# Patient Record
Sex: Female | Born: 1970
Health system: Southern US, Community
[De-identification: ages and names within clinical notes are randomized; demographics above are authoritative.]

## PROBLEM LIST (undated history)

## (undated) DIAGNOSIS — F32A Depression, unspecified: Secondary | ICD-10-CM

## (undated) DIAGNOSIS — E282 Polycystic ovarian syndrome: Secondary | ICD-10-CM

## (undated) DIAGNOSIS — E119 Type 2 diabetes mellitus without complications: Secondary | ICD-10-CM

## (undated) DIAGNOSIS — F329 Major depressive disorder, single episode, unspecified: Secondary | ICD-10-CM

## (undated) DIAGNOSIS — G4730 Sleep apnea, unspecified: Secondary | ICD-10-CM

## (undated) DIAGNOSIS — E785 Hyperlipidemia, unspecified: Secondary | ICD-10-CM

## (undated) HISTORY — DX: Hyperlipidemia, unspecified: E78.5

## (undated) HISTORY — PX: THROAT SURGERY: SHX803

## (undated) HISTORY — PX: LAPAROTOMY: SHX154

## (undated) HISTORY — DX: Type 2 diabetes mellitus without complications: E11.9

## (undated) HISTORY — DX: Depression, unspecified: F32.A

## (undated) HISTORY — DX: Major depressive disorder, single episode, unspecified: F32.9

---

## 1997-08-27 ENCOUNTER — Other Ambulatory Visit: Admission: RE | Admit: 1997-08-27 | Discharge: 1997-08-27 | Payer: Self-pay | Admitting: *Deleted

## 1999-08-21 ENCOUNTER — Other Ambulatory Visit: Admission: RE | Admit: 1999-08-21 | Discharge: 1999-08-21 | Payer: Self-pay | Admitting: Internal Medicine

## 2000-04-23 ENCOUNTER — Encounter: Payer: Self-pay | Admitting: Emergency Medicine

## 2000-04-23 ENCOUNTER — Emergency Department (HOSPITAL_COMMUNITY): Admission: EM | Admit: 2000-04-23 | Discharge: 2000-04-23 | Payer: Self-pay | Admitting: Emergency Medicine

## 2000-08-13 ENCOUNTER — Emergency Department (HOSPITAL_COMMUNITY): Admission: EM | Admit: 2000-08-13 | Discharge: 2000-08-13 | Payer: Self-pay | Admitting: Emergency Medicine

## 2000-08-13 ENCOUNTER — Encounter: Payer: Self-pay | Admitting: Emergency Medicine

## 2000-08-14 ENCOUNTER — Emergency Department (HOSPITAL_COMMUNITY): Admission: EM | Admit: 2000-08-14 | Discharge: 2000-08-14 | Payer: Self-pay | Admitting: Emergency Medicine

## 2000-08-15 ENCOUNTER — Encounter: Payer: Self-pay | Admitting: Emergency Medicine

## 2000-08-15 ENCOUNTER — Emergency Department (HOSPITAL_COMMUNITY): Admission: EM | Admit: 2000-08-15 | Discharge: 2000-08-15 | Payer: Self-pay | Admitting: Emergency Medicine

## 2003-01-09 ENCOUNTER — Encounter: Admission: RE | Admit: 2003-01-09 | Discharge: 2003-01-09 | Payer: Self-pay | Admitting: Internal Medicine

## 2003-04-03 ENCOUNTER — Other Ambulatory Visit (HOSPITAL_COMMUNITY): Admission: RE | Admit: 2003-04-03 | Discharge: 2003-04-19 | Payer: Self-pay | Admitting: Psychiatry

## 2003-10-14 ENCOUNTER — Ambulatory Visit (HOSPITAL_COMMUNITY): Admission: RE | Admit: 2003-10-14 | Discharge: 2003-10-14 | Payer: Self-pay | Admitting: Emergency Medicine

## 2003-10-14 ENCOUNTER — Emergency Department (HOSPITAL_COMMUNITY): Admission: EM | Admit: 2003-10-14 | Discharge: 2003-10-14 | Payer: Self-pay | Admitting: Emergency Medicine

## 2007-11-15 ENCOUNTER — Ambulatory Visit: Payer: Self-pay

## 2009-07-30 ENCOUNTER — Ambulatory Visit (HOSPITAL_COMMUNITY): Admission: RE | Admit: 2009-07-30 | Discharge: 2009-07-30 | Payer: Self-pay | Admitting: Obstetrics and Gynecology

## 2010-04-27 LAB — HCG, SERUM, QUALITATIVE: Preg, Serum: NEGATIVE

## 2010-04-27 LAB — CBC
MCHC: 33.8 g/dL (ref 30.0–36.0)
RBC: 4.08 MIL/uL (ref 3.87–5.11)

## 2010-09-19 ENCOUNTER — Telehealth: Payer: Self-pay | Admitting: *Deleted

## 2010-10-02 NOTE — Telephone Encounter (Signed)
Error

## 2010-10-04 ENCOUNTER — Emergency Department (HOSPITAL_COMMUNITY)
Admission: EM | Admit: 2010-10-04 | Discharge: 2010-10-05 | Disposition: A | Discharge status: Other Acute Inpatient Hospital | Payer: Self-pay | Attending: Emergency Medicine | Admitting: Emergency Medicine

## 2010-10-04 DIAGNOSIS — F3289 Other specified depressive episodes: Secondary | ICD-10-CM | POA: Insufficient documentation

## 2010-10-04 DIAGNOSIS — F329 Major depressive disorder, single episode, unspecified: Secondary | ICD-10-CM | POA: Insufficient documentation

## 2010-10-04 DIAGNOSIS — R45851 Suicidal ideations: Secondary | ICD-10-CM | POA: Insufficient documentation

## 2010-10-05 ENCOUNTER — Inpatient Hospital Stay (HOSPITAL_COMMUNITY)
Admission: EM | Admit: 2010-10-05 | Discharge: 2010-10-19 | DRG: 885 | Disposition: A | Discharge status: Home / Self Care | Payer: PRIVATE HEALTH INSURANCE | Source: Other Acute Inpatient Hospital | Attending: Psychiatry | Admitting: Psychiatry

## 2010-10-05 DIAGNOSIS — R45851 Suicidal ideations: Secondary | ICD-10-CM

## 2010-10-05 DIAGNOSIS — E282 Polycystic ovarian syndrome: Secondary | ICD-10-CM

## 2010-10-05 DIAGNOSIS — Z79899 Other long term (current) drug therapy: Secondary | ICD-10-CM

## 2010-10-05 DIAGNOSIS — Z560 Unemployment, unspecified: Secondary | ICD-10-CM

## 2010-10-05 DIAGNOSIS — Z882 Allergy status to sulfonamides status: Secondary | ICD-10-CM

## 2010-10-05 DIAGNOSIS — G4730 Sleep apnea, unspecified: Secondary | ICD-10-CM

## 2010-10-05 DIAGNOSIS — E119 Type 2 diabetes mellitus without complications: Secondary | ICD-10-CM

## 2010-10-05 DIAGNOSIS — F329 Major depressive disorder, single episode, unspecified: Principal | ICD-10-CM

## 2010-10-05 LAB — COMPREHENSIVE METABOLIC PANEL
BUN: 9 mg/dL (ref 6–23)
Calcium: 9.9 mg/dL (ref 8.4–10.5)
Creatinine, Ser: 0.77 mg/dL (ref 0.50–1.10)
GFR calc Af Amer: >60 mL/min (ref >60)
Glucose, Bld: 104 mg/dL — ABNORMAL HIGH (ref 70–99)
Total Protein: 7.5 g/dL (ref 6.0–8.3)

## 2010-10-05 LAB — DIFFERENTIAL
Basophils Relative: 1 % (ref 0–1)
Eosinophils Absolute: 0.4 10*3/uL (ref 0.0–0.7)
Lymphs Abs: 3.9 10*3/uL (ref 0.7–4.0)
Neutrophils Relative %: 61 % (ref 43–77)

## 2010-10-05 LAB — CBC
MCV: 95.4 fL (ref 78.0–100.0)
Platelets: 356 10*3/uL (ref 150–400)
RBC: 4.37 MIL/uL (ref 3.87–5.11)
WBC: 13.4 10*3/uL — ABNORMAL HIGH (ref 4.0–10.5)

## 2010-10-05 LAB — ETHANOL: Alcohol, Ethyl (B): <11 mg/dL (ref 0–11)

## 2010-10-05 LAB — RAPID URINE DRUG SCREEN, HOSP PERFORMED
Amphetamines: POSITIVE — AB
Benzodiazepines: POSITIVE — AB
Cocaine: NOT DETECTED
Opiates: NOT DETECTED

## 2010-10-05 LAB — GLUCOSE, CAPILLARY: Glucose-Capillary: 125 mg/dL — ABNORMAL HIGH (ref 70–99)

## 2010-10-06 DIAGNOSIS — F339 Major depressive disorder, recurrent, unspecified: Secondary | ICD-10-CM

## 2010-10-06 DIAGNOSIS — F6089 Other specific personality disorders: Secondary | ICD-10-CM

## 2010-10-07 LAB — GLUCOSE, CAPILLARY: Glucose-Capillary: 115 mg/dL — ABNORMAL HIGH (ref 70–99)

## 2010-10-07 NOTE — Assessment & Plan Note (Signed)
  NAME:  Porter, Alicia             ACCOUNT NO.:  [id number]  MEDICAL RECORD NO.:  [id number]  LOCATION:  0507                          FACILITY:  [facility]  PHYSICIAN:  Franchot Gallo, MD     DATE OF BIRTH:  1971-02-04  DATE OF ADMISSION:  10/05/2010 DATE OF DISCHARGE:                      PSYCHIATRIC ADMISSION ASSESSMENT   HISTORY OF PRESENT ILLNESS:  The patient presented to the emergency department on petition and  with petition papers, stating the patient was having suicidal thoughts with a plan and means.  The patient was recently terminated from her job, unable to care for herself and hopeless; has had previous suicide attempts and is considered a danger to herself.  The patient did have a plan to overdose on insulin.  Much of this dictation is gleaned from the record.  Again, the patient was reported to have a plan on injecting herself with insulin, because it is the easiest way and again, has a past history of suicide attempts.  PAST PSYCHIATRIC HISTORY:  This appears to be the patient's first admission to the [facility]  She apparently was seen at [facility]  SOCIAL HISTORY:  Unclear, but the records indicate the patient lives in [place] and it is unclear if other family members reside with her, but it stated the patient is single and unemployed.  No apparent alcohol or substance use.  FAMILY HISTORY:  Unknown.  PRIMARY CARE PROVIDER:  Unknown.  MEDICAL PROBLEMS LIST:  Diabetes and sleep apnea.  MEDICATIONS LISTED FROM THE EMERGENCY ROOM RECORDS:  Geodon 40 mg b.i.d., Xanax 0.5 mg p.r.n., spironolactone 50 mg daily, Pristiq 50 mg daily, Abilify 10 mg daily.  DRUG ALLERGIES:  Sulfa.  PHYSICAL EXAMINATION:  General:  This is a middle-aged female.  She appears in no distress, although very angry and hostile.  Her physical exam reviewed from the emergency room shows no significant abnormalities.  LABORATORY DATA:  Her alcohol level is less than 11.   Glucose of 104. Urine drug screen is positive for benzodiazepines and amphetamines.  The white count is at 13.4.  MENTAL STATUS EXAM:  The patient is resting in bed.  The patient was very angry and immediately yelling, asking me what was there for.  Did not want to answer the same questions, cursing and states that she did need not to be here.  Unable to obtain further information.  DIAGNOSES:  AXIS I:  Major depression disorder. AXIS II:  Deferred. AXIS III:  History of diabetes, sleep apnea and polycystic ovarian disease. AXIS IV:  Possible problems related to occupation, other psychosocial problems and medical problems. AXIS V:  Currently 25-30.  PLAN:  Obtain more collateral information, review medications, contact family for background and check her blood sugars b.i.d.     Landry Corporal, N.P.   ______________________________ Franchot Gallo, MD    JO/MEDQ  D:  10/06/2010  T:  10/06/2010  Job:  [id number]  Electronically Signed by Limmie Patricia.P. on 10/07/2010 09:08:22 AM Electronically Signed by Franchot Gallo MD on 10/07/2010 04:43:39 PM

## 2010-10-09 LAB — GLUCOSE, CAPILLARY: Glucose-Capillary: 108 mg/dL — ABNORMAL HIGH (ref 70–99)

## 2010-10-11 DIAGNOSIS — F609 Personality disorder, unspecified: Secondary | ICD-10-CM

## 2010-10-11 DIAGNOSIS — F909 Attention-deficit hyperactivity disorder, unspecified type: Secondary | ICD-10-CM

## 2010-10-11 DIAGNOSIS — F339 Major depressive disorder, recurrent, unspecified: Secondary | ICD-10-CM

## 2010-10-11 LAB — TSH: TSH: 2.126 u[IU]/mL (ref 0.350–4.500)

## 2010-10-11 LAB — T3, FREE: T3, Free: 2.8 pg/mL (ref 2.3–4.2)

## 2010-10-20 NOTE — Discharge Summary (Signed)
NAME:  Alicia Porter, Alicia Porter             ACCOUNT NO.:  [id number]  MEDICAL RECORD NO.:  [id number]  LOCATION:  0507                          FACILITY:  [facility]  PHYSICIAN:  Franchot Gallo, MD     DATE OF BIRTH:  07-04-1970  DATE OF ADMISSION:  10/05/2010 DATE OF DISCHARGE:  10/19/2010                              DISCHARGE SUMMARY   REASON FOR ADMISSION:  This was a 40 year old female that presented on petition stating that the patient was having suicidal thoughts with a plan and means.  She was recently terminated from a job, unable to care for herself, feeling very hopeless and has had a history of previous suicide attempts.  FINAL DIAGNOSES:   AXIS I:  Major depressive disorder, attention deficit disorder. AXIS II:  Cluster B personality characteristics. AXIS III:  History of diabetes, polycystic ovarian disease and sleep apnea. AXIS IV:  Unemployment. AXIS V:  Global assessment of functioning at discharge 60-65.LABORATORY DATA:  Alcohol level less than 11, glucose 104.  Urine drug screen positive for benzodiazepines and amphetamines.  SIGNIFICANT FINDINGS:  The patient was admitted to the adult milieu for safety and stabilization.  We will attempt to gain more information as the patient is not talking right now, very hostile.  We will monitor her blood sugars.  The patient was reporting problems with her sleep and appetite.  Rating her depression severe.  She was having vague homicidal thoughts, but no plan or intent and having no psychotic symptoms.  We initiated Lexapro for depression.  Discontinued her Pristiq.  Changed her Geodon at bedtime for mood stabilization.  She was reporting difficulty sleeping and continued with having problem with appetite. The patient was participating in groups.  She was becoming more open and willing to discuss her symptoms.  She was still endorsing problems with sleep and having racing thoughts.  Her appetite was improving.  Having some mild  paranoid ideations.  No medication side effects.  We discussed the options of adjusting doses of her medications, but the patient was wanting to stay on her current meds at this time.  We discontinued the patient's Xanax as she was reporting ongoing anxiety and changed to Klonopin t.i.d.  We had contact with the patient's father to address any safety issues and for [place] to provide information and suicide risk factors, prevention and intervention.  The patient's partner stated that she would lock up the patient's insulin and secure the key.  Her partner had attended family forum where they discussed general information, depression and stress.  The patient was beginning to improve, but continued to endorse ruminating thoughts and feeling very anxious.  The patient's depression was improving, rating it a 6 on a scale 1-10 and her hopelessness a 3 on a scale of 1-10.  We changed the times of her Klonopin so the patient could sleep at night and increased her Adderall.  We also increased her Geodon to lessen her depressive symptoms and to help with her paranoid thinking.  We also started Effexor to augment her antidepressant.  Her sleep was improving. Her appetite was improving.  Her depression was less, but she continued to endorse low energy.  The patient was getting better  and was tired of being "locked up."  She felt that she was at her optimum with her hospitalization, wanting to return home and felt that she was ready to go home.  On day of discharge, the patient was seen by Dr. Rogers Blocker, who felt the patient was stable for discharge.  She showed no acute manic or psychotic symptoms.  No side effects reported.  She was motivated to follow up with her outside providers and was felt stable for discharge.  DISCHARGE MEDICATIONS: 1. Klonopin 1 mg t.i.d. 2. Lexapro 20 mg daily. 3. Effexor XR 75 mg one daily. 4. Geodon 20 mg taking 100 mg at 6 p.m. and 40 mg in the morning. 5. Adderall 20  mg one tablet b.i.d. 6. Spirolactone 50 mg one daily.  FOLLOW-UP APPOINTMENTS: 1. [facility] Mixer, phone number [phone]. 2. [facility], phone number [phone].     Landry Corporal, N.P.   ______________________________ Franchot Gallo, MD    JO/MEDQ  D:  10/20/2010  T:  10/20/2010  Job:  [id number]  Electronically Signed by Limmie PatriciaP. on 10/20/2010 03:25:09 PM Electronically Signed by Franchot Gallo MD on 10/20/2010 06:03:55 PM

## 2011-01-23 ENCOUNTER — Encounter (HOSPITAL_COMMUNITY): Payer: Self-pay | Admitting: *Deleted

## 2011-01-23 ENCOUNTER — Inpatient Hospital Stay (HOSPITAL_COMMUNITY)
Admission: RE | Admit: 2011-01-23 | Discharge: 2011-01-26 | DRG: 885 | Disposition: A | Discharge status: Home / Self Care | Payer: PRIVATE HEALTH INSURANCE | Source: Ambulatory Visit | Attending: Psychiatry | Admitting: Psychiatry

## 2011-01-23 DIAGNOSIS — R45851 Suicidal ideations: Secondary | ICD-10-CM

## 2011-01-23 DIAGNOSIS — Z882 Allergy status to sulfonamides status: Secondary | ICD-10-CM

## 2011-01-23 DIAGNOSIS — F603 Borderline personality disorder: Secondary | ICD-10-CM

## 2011-01-23 DIAGNOSIS — G4700 Insomnia, unspecified: Secondary | ICD-10-CM

## 2011-01-23 DIAGNOSIS — F339 Major depressive disorder, recurrent, unspecified: Secondary | ICD-10-CM

## 2011-01-23 DIAGNOSIS — Z560 Unemployment, unspecified: Secondary | ICD-10-CM

## 2011-01-23 DIAGNOSIS — Z79899 Other long term (current) drug therapy: Secondary | ICD-10-CM

## 2011-01-23 DIAGNOSIS — G4733 Obstructive sleep apnea (adult) (pediatric): Secondary | ICD-10-CM

## 2011-01-23 DIAGNOSIS — F332 Major depressive disorder, recurrent severe without psychotic features: Principal | ICD-10-CM

## 2011-01-23 DIAGNOSIS — F411 Generalized anxiety disorder: Secondary | ICD-10-CM

## 2011-01-23 DIAGNOSIS — E282 Polycystic ovarian syndrome: Secondary | ICD-10-CM

## 2011-01-23 DIAGNOSIS — Z888 Allergy status to other drugs, medicaments and biological substances status: Secondary | ICD-10-CM

## 2011-01-23 HISTORY — DX: Polycystic ovarian syndrome: E28.2

## 2011-01-23 HISTORY — DX: Sleep apnea, unspecified: G47.30

## 2011-01-23 MED ORDER — NICOTINE 21 MG/24HR TD PT24
21.0000 mg | MEDICATED_PATCH | Freq: Every day | TRANSDERMAL | Status: DC
  Administered 2011-01-24 – 2011-01-26 (×3): 21 mg via TRANSDERMAL
  Filled 2011-01-23 (×5): qty 1

## 2011-01-23 MED ORDER — ACETAMINOPHEN 325 MG PO TABS
650.0000 mg | ORAL_TABLET | Freq: Four times a day (QID) | ORAL | Status: DC | PRN
  Administered 2011-01-24 (×2): 650 mg via ORAL

## 2011-01-23 MED ORDER — MAGNESIUM HYDROXIDE 400 MG/5ML PO SUSP: 30.0000 mL | Freq: Every day | ORAL | Status: DC | PRN

## 2011-01-23 MED ORDER — CLONAZEPAM 1 MG PO TABS
1.0000 mg | ORAL_TABLET | Freq: Three times a day (TID) | ORAL | Status: DC
  Administered 2011-01-23 – 2011-01-26 (×9): 1 mg via ORAL
  Filled 2011-01-23 (×9): qty 1

## 2011-01-23 MED ORDER — LORAZEPAM 1 MG PO TABS
2.0000 mg | ORAL_TABLET | Freq: Four times a day (QID) | ORAL | Status: DC | PRN
  Administered 2011-01-23 – 2011-01-24 (×2): 2 mg via ORAL
  Filled 2011-01-23 (×2): qty 2

## 2011-01-23 MED ORDER — ALUM & MAG HYDROXIDE-SIMETH 200-200-20 MG/5ML PO SUSP: 30.0000 mL | ORAL | Status: DC | PRN

## 2011-01-23 MED ORDER — SPIRONOLACTONE 50 MG PO TABS
50.0000 mg | ORAL_TABLET | Freq: Every day | ORAL | Status: DC
  Administered 2011-01-24 – 2011-01-26 (×3): 50 mg via ORAL
  Filled 2011-01-23 (×4): qty 1

## 2011-01-23 MED ORDER — ZIPRASIDONE HCL 60 MG PO CAPS
ORAL_CAPSULE | ORAL | Status: AC
Start: 2011-01-23 — End: 2011-01-24
  Filled 2011-01-23: qty 2

## 2011-01-23 MED ORDER — ZIPRASIDONE HCL 60 MG PO CAPS
120.0000 mg | ORAL_CAPSULE | Freq: Every day | ORAL | Status: DC
  Administered 2011-01-23: 120 mg via ORAL
  Filled 2011-01-23: qty 2
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
## 2011-01-24 DIAGNOSIS — F603 Borderline personality disorder: Secondary | ICD-10-CM | POA: Diagnosis present

## 2011-01-24 DIAGNOSIS — F39 Unspecified mood [affective] disorder: Secondary | ICD-10-CM

## 2011-01-24 DIAGNOSIS — F411 Generalized anxiety disorder: Secondary | ICD-10-CM | POA: Diagnosis present

## 2011-01-24 DIAGNOSIS — F339 Major depressive disorder, recurrent, unspecified: Secondary | ICD-10-CM | POA: Diagnosis present

## 2011-01-24 LAB — COMPREHENSIVE METABOLIC PANEL
AST: 13 U/L (ref 0–37)
Albumin: 3.6 g/dL (ref 3.5–5.2)
Calcium: 9.8 mg/dL (ref 8.4–10.5)
Creatinine, Ser: 0.89 mg/dL (ref 0.50–1.10)
GFR calc non Af Amer: 79 mL/min — ABNORMAL LOW (ref >90)

## 2011-01-24 LAB — CBC
Hemoglobin: 12.8 g/dL (ref 12.0–15.0)
MCH: 31.1 pg (ref 26.0–34.0)
RBC: 4.12 MIL/uL (ref 3.87–5.11)

## 2011-01-24 MED ORDER — IBUPROFEN 600 MG PO TABS
600.0000 mg | ORAL_TABLET | Freq: Four times a day (QID) | ORAL | Status: DC | PRN
  Administered 2011-01-24: 600 mg via ORAL
  Filled 2011-01-24: qty 1

## 2011-01-24 MED ORDER — VENLAFAXINE HCL 75 MG PO TABS
75.0000 mg | ORAL_TABLET | Freq: Two times a day (BID) | ORAL | Status: DC
  Administered 2011-01-26: 75 mg via ORAL
  Filled 2011-01-24 (×2): qty 1
  Filled 2011-01-24: qty 2
  Filled 2011-01-24: qty 1
  Filled 2011-01-24: qty 2
  Filled 2011-01-24 (×3): qty 1

## 2011-01-24 MED ORDER — OLANZAPINE 5 MG PO TABS
5.0000 mg | ORAL_TABLET | Freq: Every day | ORAL | Status: DC
  Administered 2011-01-24 – 2011-01-25 (×2): 5 mg via ORAL
  Filled 2011-01-24 (×4): qty 1

## 2011-01-24 MED ORDER — IBUPROFEN 600 MG PO TABS
ORAL_TABLET | ORAL | Status: AC
  Administered 2011-01-24: 16:00:00
  Filled 2011-01-24: qty 1

## 2011-01-24 MED ORDER — OLANZAPINE 5 MG PO TABS
5.0000 mg | ORAL_TABLET | Freq: Every day | ORAL | Status: DC | PRN
  Administered 2011-01-24 – 2011-01-25 (×2): 5 mg via ORAL
  Filled 2011-01-24: qty 1

## 2011-01-24 MED ORDER — OLANZAPINE 2.5 MG PO TABS
2.5000 mg | ORAL_TABLET | ORAL | Status: DC
  Administered 2011-01-24 – 2011-01-25 (×2): 2.5 mg via ORAL
  Filled 2011-01-24 (×5): qty 1

## 2011-01-24 NOTE — H&P (Signed)
Alicia Porter is an 40 y.o. female.   Chief Complaint:   Presents with exacerbation of depression and plan to "just take all my klonopin and go to sleep."  Feeling very depressed with 2-3 weeks of hypersomnia alternating with poor sleep.  Increased anhedonia.  Unable to search for work due to increased depression.   HPI: See Psych Admit Note  Past Medical History  Diagnosis Date  . Sleep apnea   . Polycystic ovarian disease     No past surgical history on file.  No family history on file. Social History:  reports that she has been smoking.  She does not have any smokeless tobacco history on file. Her alcohol and drug histories not on file.  Allergies:  Allergies  Allergen Reactions  . Sulfa Antibiotics   . Wellbutrin (Bupropion Hcl)     Medications Prior to Admission  Medication Dose Route Frequency Provider Last Rate Last Dose  . acetaminophen (TYLENOL) tablet 650 mg  650 mg Oral Q6H PRN Alyson Kuroski-Mazzei, DO   650 mg at 01/24/11 1247  . alum & mag hydroxide-simeth (MAALOX/MYLANTA) 200-200-20 MG/5ML suspension 30 mL  30 mL Oral Q4H PRN Alyson Kuroski-Mazzei, DO      . clonazePAM (KLONOPIN) tablet 1 mg  1 mg Oral TID Alyson Kuroski-Mazzei, DO   1 mg at 01/24/11 1247  . ibuprofen (ADVIL,MOTRIN) 600 MG tablet           . ibuprofen (ADVIL,MOTRIN) tablet 600 mg  600 mg Oral Q6H PRN Viviann Spare, NP   600 mg at 01/24/11 1609  . magnesium hydroxide (MILK OF MAGNESIA) suspension 30 mL  30 mL Oral Daily PRN Alyson Kuroski-Mazzei, DO      . nicotine (NICODERM CQ - dosed in mg/24 hours) patch 21 mg  21 mg Transdermal Q0600 Alyson Kuroski-Mazzei, DO   21 mg at 01/24/11 1610  . OLANZapine (ZYPREXA) tablet 2.5 mg  2.5 mg Oral 1 day or 1 dose Viviann Spare, NP   2.5 mg at 01/24/11 1247  . OLANZapine (ZYPREXA) tablet 5 mg  5 mg Oral QHS Viviann Spare, NP      . OLANZapine (ZYPREXA) tablet 5 mg  5 mg Oral Daily PRN Viviann Spare, NP      . spironolactone (ALDACTONE) tablet  50 mg  50 mg Oral Daily Alyson Kuroski-Mazzei, DO   50 mg at 01/24/11 0845  . venlafaxine (EFFEXOR) tablet 75 mg  75 mg Oral BID WC Viviann Spare, NP      . ziprasidone (GEODON) 60 MG capsule           . DISCONTD: LORazepam (ATIVAN) tablet 2 mg  2 mg Oral Q6H PRN Alyson Kuroski-Mazzei, DO   2 mg at 01/24/11 0358  . DISCONTD: ziprasidone (GEODON) capsule 120 mg  120 mg Oral QHS Alyson Kuroski-Mazzei, DO   120 mg at 01/23/11 2214   No current outpatient prescriptions on file as of 01/24/2011.    No results found for this or any previous visit (from the past 48 hour(s)). No results found.  Review of Systems  Constitutional: Positive for malaise/fatigue.  Eyes: Negative.   Respiratory: Negative.   Cardiovascular: Negative.   Gastrointestinal: Negative.   Genitourinary: Negative.   Musculoskeletal: Negative.   Skin: Negative.   Neurological: Positive for headaches.  Endo/Heme/Allergies: Negative.   Psychiatric/Behavioral: Positive for depression and suicidal ideas. The patient has insomnia.     Blood pressure 108/74, pulse 102, temperature 98 F (36.7 C), temperature  source Oral, resp. rate 16, height 5\' 7"  (1.702 m), weight 107.956 kg (238 lb), SpO2 97.00%. Physical Exam  Constitutional: She is oriented to person, place, and time. She appears well-developed and well-nourished.  HENT:  Head: Normocephalic.  Eyes: Pupils are equal, round, and reactive to light.  Neck: Normal range of motion. Neck supple.  Cardiovascular: Normal rate.   Respiratory: Effort normal and breath sounds normal.  GI: Soft.       Obese   Genitourinary:       deferred  Musculoskeletal: Normal range of motion.  Neurological: She is alert and oriented to person, place, and time. She has normal reflexes.  Skin: Skin is warm and dry.     Assessment/Plan See Psych Admit Note for Treatment Plan  Alicia Porter 01/24/2011, 5:09 PM

## 2011-01-24 NOTE — Progress Notes (Signed)
Patient ID: Alicia Porter, female   DOB: [birth year], 40 y.o.   MRN: [id number] 01/21/2011 D Alicia Porter has spent most of the day, in her bed asleep. When she does get up and come out in the hall, she is guarded, somewhat suspicious and paranoid. SHe  Refused her effexor today, saying it was not helping her and wants it to be stopped. SHe was given ibuprofen 600 mg at 1610 for c/o headace that was the  She received zyprexa 5 mg at 1845 , for c/o extreme anxiety and agitation. AShe did not attend her groups today, saying she was too sleepy. R Safety is maintaiend and POC includes keeping pt safe and fostering therapeutic alliance already established. PD RN [facility]

## 2011-01-24 NOTE — Progress Notes (Signed)
Suicide Risk Assessment  Admission Assessment     Demographic factors:  Assessment Details Time of Assessment: Admission Information Obtained From: Patient Current Mental Status:  Current Mental Status: Suicidal ideation indicated by patient Loss Factors:  Loss Factors: Decrease in vocational status;Financial problems / change in socioeconomic status Historical Factors:  Historical Factors: Prior suicide attempts Risk Reduction Factors:  Risk Reduction Factors: Positive therapeutic relationship  CLINICAL FACTORS:   Severe Anxiety and/or Agitation Depression:   Anhedonia Hopelessness Insomnia Severe Personality Disorders:   Cluster B More than one psychiatric diagnosis Previous Psychiatric Diagnoses and Treatments  COGNITIVE FEATURES THAT CONTRIBUTE TO RISK:  Polarized thinking    Diagnoses: Axis I:  Major Depressive Disorder - Recurrent  Generalized Anxiety Disorder Axis II:  Borderline Personality Disorder  The patient was seen today and reports the following:   Sleep: The patient reports to sleeping well last night  Appetite: Good.   Mild>(1-10) >Severe  Hopelessness (1-10): 9  Depression (1-10): 9  Anxiety (1-10): 9   Suicidal Ideation: The patient reports active suicidal ideations today but with no plan or intent.  Plan: No  Intent: No  Means: No   Homicidal Ideation: The patient adamantly denies any homicidal ideations today.  Plan: No  Intent: No.  Means: No   Eye Contact: Good.  General Appearance Luretha Murphy: Neat and Casual. The patient appears significantly depressed and anxious. Motor Behavior: Slightly slowed.  Speech: WNL  Mental Status: AO x 3  Level of Consciousness: Alert  Mood: Severely Depressed.  Affect: Severely Constricted.  Anxiety: Severely Anxious.  Thought Process: Coherent.  Thought Content: WNL. No current auditory of visual hallucinations or delusional thinking.   Perception: Normal  Judgment: Fair.  Insight: Fair.  Cognition:  Orientated to person, place and time.   The patient states that she is interested in being transferred to [facility] for ECT.  She states she has been on every antidepressant on the market and feels ECT is her last hope.  I explained that this option can certainly be explored by her treatment team.  Treatment Plan Summary:  1. Daily contact with patient to assess and evaluate symptoms and progress in treatment  2. Medication management  3. The patient will deny suicidal ideations or homicidal ideations for 48 hours prior to discharge and have a depression and anxiety rating of 3 or less. The patient will also deny any auditory or visual hallucinations or delusional thinking or display any manic or hypomanic behaviors.    Plan:  1. Will restart current medications with adjustments.  2. Will continue to monitor.   SUICIDE RISK:   Moderate:  Frequent suicidal ideation with limited intensity, and duration, some specificity in terms of plans, no associated intent, good self-control, limited dysphoria/symptomatology, some risk factors present, and identifiable protective factors, including available and accessible social support.  Kanasia Gayman 01/24/2011, 11:23 AM

## 2011-01-24 NOTE — Progress Notes (Signed)
[facility] Group Notes:  (Counselor/Nursing/MHT/Case Management/Adjunct)  01/24/2011 10:59 AM  Type of Therapy:  Counseling  Pt. Attended after care planning group and was given [place] Suicide Prevention information and crisis hot line numbers. Pt. agreed to use the numbers if needed.  Pt. Spoke about feeeling bad. She stated she has not seen a doctor yet and was told by the therapist that she would be seeing someone today.   Lamar Blinks [place] 01/24/2011, 10:59 AM

## 2011-01-24 NOTE — Progress Notes (Signed)
Lying quietly in bed with eyes closed.  No complaints of pain or other discomfort

## 2011-01-25 LAB — TSH: TSH: 1.031 u[IU]/mL (ref 0.350–4.500)

## 2011-01-25 MED ORDER — OLANZAPINE 2.5 MG PO TABS
2.5000 mg | ORAL_TABLET | Freq: Three times a day (TID) | ORAL | Status: DC | PRN
Start: 2011-01-25 — End: 2011-01-26
  Administered 2011-01-25 – 2011-01-26 (×3): 2.5 mg via ORAL
  Filled 2011-01-25: qty 14
  Filled 2011-01-25 (×3): qty 1

## 2011-01-25 NOTE — Progress Notes (Signed)
[facility] Group Notes:  (Counselor/Nursing/MHT/Case Management/Adjunct)  01/25/2011 2:12 PM  Type of Therapy:  group therapy  Participation Level:  Did Not Attend    Summary of Progress/Problems:   [name] 01/25/2011, 2:12 PM

## 2011-01-25 NOTE — Progress Notes (Signed)
[facility] Adult [facility]patient Family/Significant Other Suicide Prevention Education  Suicide Prevention Education:  Contact Attempts: Alicia Porter's partner Alicia Porter [phone] has been identified by the patient as the family member/significant other with whom the patient will be residing, and identified as the person(s) who will aid the patient in the event of a mental health crisis.  With written consent from the patient, two attempts were made to provide suicide prevention education, prior to and/or following the patient's discharge.  We were unsuccessful in providing suicide prevention education.  A suicide education pamphlet was given to the patient to share with family/significant other.  Date and time of first attempt:11:13 AM 01/25/2011 [name], LPCA  Date and time of second attempt:  [name] 01/25/2011, 11:12 AM

## 2011-01-25 NOTE — Progress Notes (Signed)
Patient ID: Alicia Porter, female   DOB: 1971-01-03, 40 y.o.   MRN: [id number] 01/25/2011  D Kathrynne got up this morning...after breakfast, came to the med window and took her medicine. She is agitated, irritable and " tired" of being depressed. She speaks of wanting to have ETC, stated  she had spoken to the dr about this and then went back to her room. She immediately came back to the window, demanded breakfast and wanted to know why she wasn't woken up.Marland Kitchen She was served fruit cocktail  And a  granola bar. R Safety is maintained and POC includes fostering therapeutic relationship and helping deescalate pt when she gets agitated. PD RN [facility]

## 2011-01-25 NOTE — Progress Notes (Signed)
Pt was seen by writer walking up the hallway. Writer introduced self to pt as her nurse and inquired as to how she was feeling. Pt reported that she had a headache earlier and was given ibuprofen which helped a lot. Pt currently voiced no complaints  And was informed of her scheduled medications and she was agreeable to taking it . Pt currently denies having pain, - si/hi/a/v hall.  Safety maintained on unit, will continue to monitor.

## 2011-01-25 NOTE — Progress Notes (Signed)
Pt was met in the hallway wanting her scheduled hs medications because she could not sleep. Writer informed pt that it was too soon and could receive them at 2130. Pt c/o not being able to sleep because her roommate has the light on and is eating. Writer reminded pt that we do not have private rooms here and reminded her that she had received a prn of zyprexa at 1630 and there was nothing else available currently. Pt was not happy with information received and did not attend wrap up group. Safety maintained on unit will continue to monitor.

## 2011-01-25 NOTE — Progress Notes (Signed)
Patient ID: Alicia Porter, female   DOB: [birth year], 40 y.o.   MRN: [id number]   S: Current medication combination has helped a lot with her anxiety, but she slept through breakfast after taking a 1 time dose of additional 5 mg Zyprexa about 3am after also taking her HS dose of 5mg  Zyprexa.  Says it is making her really sleepy.  Feels very depressed with positive suicidal thoughts and a lot of hopelessness but denies any plan to harm herself and can be safe on the unit.    She wants help applying for disability and is not sure how to proceed.  Has filed some information on line with SS administration, but doesn't know how to move forward with a lot of the information required. Also looking forward to ECT and finding out about how we will proceed with that referral.  Nightmares are a problem last night - I have discussed that it may be due to wearing the nicotine patch all night long and will request that this be removed at HS.  She feels she cannot work consistently with the level of depression she has been having.    O;  Blunt affect, looks sad, disheveled, no body odor.  Affect flat, has psychomotor slowing and looks very depressed.  Cooperative.  No evidence of psychotic thinking.  Does not appear internally distracted.  Thinking is logical with adequate insight.  Cognition preserved with some very mild slowing of thought processes.  Concentration is decreased. No HI.  P: Stop Nicotine Patch at HS. And Decrease Zyprexa to 5mg  HS, then 2.5 mg prn q 8 hours.

## 2011-01-25 NOTE — Progress Notes (Signed)
10:10 AM 01/25/2011                         Pt attended after care group, pt received suicide prevention information and showed understanding of who is at risk, warning signs, what to do and who to call. Depression 10, anxiety 0- pt here for SI able to contract for safety. Pt has transportation at discharge, pt has therapist Kathyrn Drown in [place] and Dr. Lennice Sites at [facility] for med management. Pt given suicide prevention info, pt understands who is at risk, warning signs, what to do and who to call.

## 2011-01-25 NOTE — Progress Notes (Signed)
Adult Psychosocial Assessment Update Interdisciplinary Team  Previous [facility] admissions/discharges:  Admissions Discharges  Date: 10-05-10 Date :10-21-10  Date: Date:  Date: Date:  Date: Date:  Date: Date:   Changes since the last Psychosocial Assessment (including adherence to outpatient mental health and/or substance abuse treatment, situational issues contributing to decompensation and/or relapse). Depression has become worse  S/I has increased with a plan to over dose (same as past attempts)           Discharge Plan 1. Will you be returning to the same living situation after discharge?   Yes: No:      If no, what is your plan?    Unknown can return home to live with parents but would like referral to [facility] for ECT       2. Would you like a referral for services when you are discharged? Yes:X     If yes, for what services?  No:       [facility]       Summary and Recommendations (to be completed by the evaluator) Recommendations include crisis stabilization, case management, medication management, psycho- education groups to teach coping skills and group therapy.                        Signature:  Gevena Mart, 01/25/2011 9:31 AM

## 2011-01-26 DIAGNOSIS — F411 Generalized anxiety disorder: Secondary | ICD-10-CM

## 2011-01-26 DIAGNOSIS — F339 Major depressive disorder, recurrent, unspecified: Secondary | ICD-10-CM

## 2011-01-26 MED ORDER — VENLAFAXINE HCL 75 MG PO TABS: 75.0000 mg | ORAL_TABLET | ORAL | Status: DC

## 2011-01-26 MED ORDER — OLANZAPINE 2.5 MG PO TABS: 2.5000 mg | ORAL_TABLET | Freq: Three times a day (TID) | ORAL | Status: AC | PRN

## 2011-01-26 MED ORDER — OLANZAPINE 5 MG PO TABS: 5.0000 mg | ORAL_TABLET | Freq: Every day | ORAL | Status: DC

## 2011-01-26 MED ORDER — OLANZAPINE 2.5 MG PO TABS: 2.5000 mg | ORAL_TABLET | Freq: Every day | ORAL | Status: DC

## 2011-01-26 MED ORDER — OLANZAPINE 5 MG PO TABS: 5.0000 mg | ORAL_TABLET | Freq: Every day | ORAL | Status: AC

## 2011-01-26 NOTE — Progress Notes (Signed)
Suicide Risk Assessment  Discharge Assessment     Demographic factors: Assessment Details; See chart   Current Mental Status: Patient seen and evaluated in treatment team for Dr. Dan Humphreys. Patient is requesting discharge. Of note, patient has been diagnosed with borderline personality disorder and has been noted to have chronic suicidal ideation. She stated that she is currently stable on her combination of medications and is requesting discharge in order to start her new job on Wednesday. Patient stated that her mood was "good, now that she got a job". Her affect was mood congruent and euthymic. She denied any current thoughts of self injurious behavior, suicidal ideation or homicidal ideation. She denied any significant depressive signs or symptoms at this time. There were no auditory or visual hallucinations, paranoia, delusional thought processes, or mania noted.  Thought process was linear and goal directed.  No psychomotor agitation or retardation was noted. Her speech was normal rate, tone and volume. Eye contact was good. Judgment and insight are limited.  Patient has been up and engaged on the unit.  No safety concerns reported from team.  Last documented inventory, pt denied any SI and requested discharge.  Discharge supported by entire team.   Loss Factors: Financial problems / change in socioeconomic status   Historical Factors: Prior suicide attempts and chronic SI in past  Risk Reduction Factors: Positive therapeutic relationship; recent employment   CLINICAL FACTORS: Severe Anxiety; Depression; Personality Disorder    COGNITIVE FEATURES THAT CONTRIBUTE TO RISK: Polarized thinking   Diagnoses per Dr. Emmit Pomfret:  Axis I: Major Depressive Disorder - Recurrent; Generalized Anxiety Disorder  Axis II: Borderline Personality Disorder   Medications:    . clonazePAM  1 mg Oral TID  . nicotine  21 mg Transdermal Q0600  . OLANZapine  5 mg Oral QHS  . spironolactone  50 mg Oral Daily  .  venlafaxine  75 mg Oral BID WC  . DISCONTD: OLANZapine  2.5 mg Oral 1 day or 1 dose   Vitals: Filed Vitals:   01/26/11 0702  BP: 109/74  Pulse: 97  Temp:   Resp:    SUICIDE RISK: Pt viewed as a chronic increased risk of harm to self in light of her past hx and risk factors.  No acute safety concerns reported on the unit through the weekend.  Pt contracting for safety and requesting discharge in order to go back to work on Weds. at new job.  A/Plan: Pt seen and evaluated in team for Dr. Dan Humphreys.  Chart reviewed.  Pt stable for and requesting discharge. Pt contracting for safety and does not currently meet [facility] involuntary commitment criteria for continued hospitalization.  Mental health treatment and medication management will mitigate against the increased risk of harm to self and/or others.  Pt agreeable with the plan.  Discussed with the team.  No SEs reported from meds. Please see orders, follow up plans per team and full discharge summary completed by physician extender.    Lupe Carney 01/26/2011, 11:43 AM

## 2011-01-26 NOTE — Progress Notes (Signed)
[facility] Group Notes: (Counselor/Nursing/MHT/Case Management/Adjunct)   Type of Therapy:  Group Therapy  Participation Level:  Active  Participation Quality:  Appropriate, Sharing, and Attentives  Affect:  Blunted  Cognitive:  Appropriate  Insight:Limited  Engagement in Group: Good   Engagement in Therapy:  Limited  Modes of Intervention:  Support and Exploration  Summary of Progress/Problems:  Alicia Porter discussed her problems with finding medications that work for her as a main obstacle to wellness, though she reports that she now is on the right medications. She went on to say that unemployment and not having a sense of meaning contributed to her depression, so getting this new job is a first step to having better wellness.    [name] 01/26/2011  12:49 PM

## 2011-01-26 NOTE — Tx Team (Signed)
Interdisciplinary Treatment Plan Update (Adult)  Date:  01/26/2011  Time Reviewed:  10:03 AM   Progress in Treatment: Attending groups: Yes Participating in groups:  Yes Taking medication as prescribed:  Yes Tolerating medication: Yes Family/Significant othe contact made:  Yes, attempting contact with partner, [name] Patient understands diagnosis: Yes Discussing patient identified problems/goals with staff:  Yes Medical problems stabilized or resolved: Yes Denies suicidal/homicidal ideation: Yes Issues/concerns per patient self-inventory:  No, plans to follow up with therapy and exercise  Other:  New problem(s) identified: None  Reason for Continuation of Hospitalization:   Interventions implemented related to continuation of hospitalization:  Medication stabilization, safety checks q 15 mins, group attendance  Additional comments:  Estimated length of stay: discharge today  Discharge Plan: Discharge home, follow up with [name] for therapy and Dr Lennice Sites for med management  New goal(s):  Review of initial/current patient goals per problem list:   1.  Goal(s): Decrease depressive symptoms  Met:  Yes  Target date: by discharge  As evidenced by:  Rates depression at 0  2.  Goal (s): Eliminate SI  Met:  Yes  Target date: by discharge  As evidenced by: [name] reports no suicidal thoughts  3.  Goal(s): Stabilize medications  Met:  Yes  Target date: by discharge   As evidenced by: Reports Zyprexa is very helpful, no intolerable side effects.   Attendees: Patient:  Alicia Porter 01/26/2011 10:03 AM  Family:   01/26/2011 10:03 AM  Physician:   01/26/2011 10:03 AM  Nursing:   Lupe Carney, MD 01/26/2011 10:03 AM  Case Manager:  [name], LPCA 01/26/2011 10:03 AM  Counselor:  Angus Palms, LCSW 01/26/2011 10:03 AM  Other:  Reyes Ivan, LCSWA 01/26/2011 10:03 AM  Other:  Lynann Bologna, NP 01/26/2011 10:03 AM  Other:   01/26/2011  10:03 AM  Other:   01/26/2011 10:03 AM   Scribe for Treatment Team:   [name], 01/26/2011 10:03 AM

## 2011-01-26 NOTE — Progress Notes (Signed)
[facility] Adult Inpatient Family/Significant Other Suicide Prevention Education  Suicide Prevention Education:  Education Completed; Alicia Porter, partner,  has been identified by the patient as the family member/significant other with whom the patient will be residing, and identified as the person(s) who will aid the patient in the event of a mental health crisis (suicidal ideations/suicide attempt).  With written consent from the patient, the family member/significant other has been provided the following suicide prevention education, prior to the and/or following the discharge of the patient.  The suicide prevention education provided includes the following:  Suicide risk factors  Suicide prevention and interventions  National Suicide Hotline telephone number  [facility] assessment telephone number  [facility] Emergency Assistance 911  [facility] and/or Residential Mobile Crisis Unit telephone number  Request made of family/significant other to:  Remove weapons (e.g., guns, rifles, knives), all items previously/currently identified as safety concern.    Remove drugs/medications (over-the-counter, prescriptions, illicit drugs), all items previously/currently identified as a safety concern.  Alicia Porter expressed no concerns that Alicia Porter would be a danger to herself or others. Her main concern involves the possibility of this job not working out, and indicated that she would encourage Alicia Porter to file for disability if this job ends. 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 She identified several places that can be contacted in the event of crisis, including Transport planner and [employer].  She verbalizes understanding of the suicide prevention education information provided and agrees to remove the items of safety concern listed above. Alicia Porter reports Alicia Porter has no access to weapons.  Alicia Porter 01/26/2011, 1:56 PM

## 2011-01-26 NOTE — Progress Notes (Signed)
2:16 PM 01/26/2011                                 Case Management Note: Alicia Porter is set to discharge and denies any SI/HI. Pt will follow up with Dr. Lennice Sites at [facility] for medication management and will be making an appointment with her Therapist [name]. Pt is able to receive transportation home with her partner. Pt is motivated and excited to go home as she starts a new job on [employer] difficulty finding work was a trigger for being admitted into the hospital. [name], LPCA

## 2011-01-26 NOTE — Progress Notes (Signed)
Pt D/C home. Pt denies SI/HI. Pts rx and follow up visits were reviewed and pt verbalized understanding. Pt belongings were returned.

## 2011-01-26 NOTE — Progress Notes (Addendum)
Recreation Therapy Group Note  Date: 01/26/2011         Time: 1145      Group Topic/Focus: Patient invited to participate in animal assisted therapy. Pets as a coping skill and responsibility were discussed.   Participation Level: Active  Participation Quality: Appropriate and Attentive  Affect: Appropriate  Cognitive: Appropriate and Oriented   Additional Comments: None 

## 2011-01-27 NOTE — Discharge Summary (Signed)
    Identifying information:  This is a 40 year old Caucasian female single this is a voluntary admission.  Discharge diagnosis:  Axis I: Maj. depression recurrent severe. Generalized anxiety disorder. Axis II: Borderline personality disorder, cluster B. traits. Axis III: Obstructive sleep apnea, wears a CPAP. PCOS Axis IV: Significant issues with unemployment, resolving. Axis V: Current 57, past year 67 estimated.  Discharge medications:  Stop Geodon. Continue Klonopin 1 mg by mouth 3 times a day for anxiety Aldactone 50 mg daily for polycystic ovarian syndrome. Zyprexa 5 mg by mouth each bedtime. Zyprexa 2.5 mg every 6 hours when necessary for agitation. Decreased venlafaxine to 75 mg XL R. by mouth daily for anxiety and depression.  Course of hospitalization:  Alicia Porter was admitted for mood disorders program after experiencing an exacerbation of depression for the previous 2 weeks. She was having suicidal thoughts with a plan to " just take all my Klonopin and go to sleep"  . She reported that she was having significant financial stressors since she was unable to obtain any employment. She had made several applications but was frustrated with the lack of work. She was feeling very hopeless. She reported being financially dependent on her parents but knew that they could not help her for very long. She denied substance abuse. She had recently had an increase in her Effexor to 225 mg daily, and she felt that that was increasing her anxiety and not helping her depression, felt it was making her much worse.  We elected to decrease her Effexor to 75 mg XL daily which he tolerated well. Geodon was discontinued. We continued her Klonopin at its current dose. Zyprexa was added to control agitation she responded well to this and slept well on it at night. She occasionally used to 2.5 mg on a when necessary basis of Zyprexa, which was successful in controlling breakthrough anxiety.  By Monday,  December 17 she was feeling much better and sleeping well using her CPAP machine for naps and at night. She had secured employment to start the following day and was excited about the possibility of having a paying job. She was noted to be in full contact with reality with no dangerous ideas and was requesting discharge in order to prepare for work.  Followup:  Alicia Porter for counseling the following day.

## 2011-01-29 NOTE — Progress Notes (Signed)
Patient Discharge Instructions:  After Visit Summary Faxed,  01/29/2011 Faxed to the Next [facility] Care provider:  01/29/2011 D/C Summary faxed 01/29/2011 Facesheet faxed 01/29/2011   Faxed to [facility] @ [phone]  No fax # available for [name], sent certified mail to [facility] @  [address] [place], [place] [address]  Heloise Purpura Eduard Clos, 01/29/2011, 1:48 PM

## 2011-04-09 ENCOUNTER — Ambulatory Visit: Payer: Self-pay | Admitting: Family Medicine

## 2011-04-09 VITALS — BP 102/67 | HR 80 | Temp 98.2°F | Resp 16 | Ht 66.5 in | Wt 219.0 lb

## 2011-04-09 DIAGNOSIS — J069 Acute upper respiratory infection, unspecified: Secondary | ICD-10-CM

## 2011-04-09 DIAGNOSIS — N751 Abscess of Bartholin's gland: Secondary | ICD-10-CM

## 2011-04-09 MED ORDER — DOXYCYCLINE HYCLATE 100 MG PO TABS: 100.0000 mg | ORAL_TABLET | Freq: Two times a day (BID) | ORAL | Status: AC

## 2011-04-09 NOTE — Progress Notes (Signed)
Subjective: patient is here for 2 problems. She has been developing abscess in her left labia over the last 3 days. It is getting steadily worse. She works in Teacher, music and worries about MRSA.  She has had an upper respiratory infection for the past 2 weeks, and would like to have an antibiotic and covered both entities if possible.  Objective: Throat clear. Neck supple without nodes. Chest is clear. Left labia majora is red and swollen, with a small head starting to develop medially.  Assessment: Bartholin's abscess URI, persisting  Plan: Have the PA I&D try to culture from this lesion. Treat with doxycycline since sulfas cause her nausea.

## 2011-04-09 NOTE — Progress Notes (Signed)
  Subjective:    Patient ID: Alicia Porter, female    DOB: 05/24/1970, 41 y.o.   MRN: [id number]  HPI    Review of Systems     Objective:   Physical Exam   Procedure:  I & D of left labia.  Area cleansed with betadine and alcohol, the 1 cc of 1% plain lidocaine injected and #15 blade used to open and drain abcess.  Wound Cx obtained.  Bloody purulence expressed.  Pt tol well.  Pt given menstrual pad to wear today.  Advised to take warm bath starting tomorrow for the next 2 days to relieve pain and encourage drainage, after that the skin will likely close.  Take antibiotic as directed.  Lindaann Slough, PA-C     Assessment & Plan:

## 2011-04-12 LAB — WOUND CULTURE: Organism ID, Bacteria: NO GROWTH

## 2012-09-08 ENCOUNTER — Encounter (HOSPITAL_COMMUNITY): Payer: Self-pay | Admitting: *Deleted

## 2012-09-08 ENCOUNTER — Ambulatory Visit (HOSPITAL_COMMUNITY)
Admission: AD | Admit: 2012-09-08 | Discharge: 2012-09-08 | Disposition: A | Discharge status: Home / Self Care | Payer: BC Managed Care – PPO | Source: Intra-hospital | Attending: Psychiatry | Admitting: Psychiatry

## 2012-09-08 DIAGNOSIS — E282 Polycystic ovarian syndrome: Secondary | ICD-10-CM

## 2012-09-08 DIAGNOSIS — F3160 Bipolar disorder, current episode mixed, unspecified: Secondary | ICD-10-CM | POA: Insufficient documentation

## 2012-09-08 HISTORY — DX: Polycystic ovarian syndrome: E28.2

## 2012-09-08 NOTE — BH Assessment (Signed)
Assessment Note   Alicia Porter is an 42 y.o. femaleIn for an assessment to attend psych IOP. She was discharged yesterday from [facility] and referred here for IOP. She was admitted to the hospital because she was having a mixed episode of her bipolar disorder. She had had a decrease in her sleep for several months, didn't feel like her medications were working correctly and sought hospitalization. 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 She is not unhappy with her new role but is adjusting to it. She and her partner have been together for 14 years.She states she is still depressed, but less so and can tell because she is less irritable and angry and with her medications she is sleeping better, getting on average 8 hours.She states when she gets manic she has ideas to get rich quick and feels super powerful and effective and when depressed she gets paranoid and fearful that strangers are trying to evaluate her mental health and are out to get her. She had a previous suicide attempt in 2001, none since.She is not currently having any thoughts to hurt self or others, She denies any psychotic symptoms.She has a history of alcohol use and has had a history in early 2000's had 2 DUI's and a resisting arrest charge related to alcohol. She states she felt she was self medicating and isnt currently using alcohol and states she knows it would mess up her medications and not help her illness.She has a flat affect and states she is still depressed but less so.She is agreeable to psych IOP and informed of her start date of Tues the 5th and to come early that day to to complete necessary paperwork.Information on the IOP program given to her.  Axis I: Bipolar, mixed Axis II: Deferred Axis III:  Past Medical History  Diagnosis Date  . Sleep apnea   . Polycystic ovarian disease   . Polycystic ovarian disease 09/08/2012    takes  Aldactone for it   Axis IV: occupational problems, other psychosocial or environmental problems and problems related to social environment Axis V: 41-50 serious symptoms  Past Medical History:  Past Medical History  Diagnosis Date  . Sleep apnea   . Polycystic ovarian disease   . Polycystic ovarian disease 09/08/2012    takes Aldactone for it    No past surgical history on file.  Family History: No family history on file.  Social History:  reports that she has been smoking.  She does not have any smokeless tobacco history on file. She reports that she does not drink alcohol or use illicit drugs.  Additional Social History:  Alcohol / Drug Use Pain Medications: not abusing Prescriptions: not abusing Over the Counter: not abusing History of alcohol / drug use?: Yes Substance #1 Name of Substance 1: alcohol 1 - Age of First Use: unknown 1 - Amount (size/oz): unknown 1 - Frequency: infrequently now, but has history of problems with using, 3 arressts, in 2002 1 - Last Use / Amount: several months ago  CIWA:   COWS:    Allergies:  Allergies  Allergen Reactions  . Levofloxacin Other (See Comments)    Tendon damage  . Sulfa Antibiotics Nausea And Vomiting  . Wellbutrin (Bupropion Hcl) Other (See Comments)    "Crawling out of my skin"  . Saphris (Asenapine)     Cant tolerate it, got very irritable and aggressive on it.  Home Medications:  (Not in a hospital admission)  OB/GYN Status:  No LMP recorded. Patient is not currently having periods (Reason: IUD).  General Assessment Data Location of Assessment: [facility] Living Arrangements: Spouse/significant other Can pt return to current living arrangement?: Yes Admission Status:  (eval for psych IOP) Is patient capable of signing voluntary admission?: Yes Transfer from:  (d/c from [facility] yesterday and referred to IOP) Referral Source: MD  Education Status Is patient currently in school?:  No  Risk to self Suicidal Ideation: No Suicidal Intent: No Is patient at risk for suicide?: No Suicidal Plan?: No Access to Means: No What has been your use of drugs/alcohol within the last 12 months?: history of alcohol use none currently Previous Attempts/Gestures: Yes How many times?: 1 Other Self Harm Risks: none known Triggers for Past Attempts: Unknown Intentional Self Injurious Behavior: None Family Suicide History: Unknown Recent stressful life event(s):  (recent hospitalization for psych, and job loss in Jan) Persecutory voices/beliefs?: No Depression: Yes Depression Symptoms: Insomnia;Tearfulness;Fatigue;Guilt;Loss of interest in usual pleasures;Feeling angry/irritable Substance abuse history and/or treatment for substance abuse?: No Suicide prevention information given to non-admitted patients: Not applicable  Risk to Others Homicidal Ideation: No Thoughts of Harm to Others: No Current Homicidal Intent: No Current Homicidal Plan: No Access to Homicidal Means: No History of harm to others?: No Assessment of Violence: None Noted Does patient have access to weapons?: No Criminal Charges Pending?: No Does patient have a court date: No  Psychosis Hallucinations:  (history of paranoia when depressed) Delusions: None noted  Mental Status Report Appear/Hygiene:  (unremarkable) Eye Contact: Fair Motor Activity: Freedom of movement;Unremarkable Speech: Logical/coherent Level of Consciousness: Alert Mood: Depressed Affect: Blunted Anxiety Level: None Thought Processes: Coherent;Relevant Judgement: Unimpaired Orientation: Person;Place;Time;Situation Obsessive Compulsive Thoughts/Behaviors: None  Cognitive Functioning Concentration: Decreased Memory: Recent Intact;Remote Intact IQ: Average Insight: Fair Impulse Control: Good Appetite: Good Weight Loss: 0 Weight Gain: 0 Sleep: Increased Total Hours of Sleep: 8 (with medication) Vegetative Symptoms:  None  ADLScreening [facility]) Patient's cognitive ability adequate to safely complete daily activities?: Yes Patient able to express need for assistance with ADLs?: Yes Independently performs ADLs?: Yes (appropriate for developmental age)  Abuse/Neglect [facility]) Physical Abuse: Denies Verbal Abuse: Denies Sexual Abuse: Denies  Prior Inpatient Therapy Prior Inpatient Therapy: Yes Prior Therapy Dates: 09/07/2012 (4 previous admissions most recent [facility]) Prior Therapy Facilty/Provider(s): [facility] and twice [facility] Reason for Treatment: bipolar  Prior Outpatient Therapy Prior Outpatient Therapy: Yes Prior Therapy Dates: current and ongoing Prior Therapy Facilty/Provider(s): Dr. Evelene Croon Reason for Treatment: bipolar  ADL Screening (condition at time of admission) Patient's cognitive ability adequate to safely complete daily activities?: Yes Is the patient deaf or have difficulty hearing?: No Does the patient have difficulty seeing, even when wearing glasses/contacts?: No Does the patient have difficulty concentrating, remembering, or making decisions?: Yes Patient able to express need for assistance with ADLs?: Yes Does the patient have difficulty dressing or bathing?: No Independently performs ADLs?: Yes (appropriate for developmental age) Does the patient have difficulty walking or climbing stairs?: No Weakness of Legs: None Weakness of Arms/Hands: None  Home Assistive Devices/Equipment Home Assistive Devices/Equipment: None    Abuse/Neglect Assessment (Assessment to be complete while patient is alone) Physical Abuse: Denies Verbal Abuse: Denies Sexual Abuse: Denies Exploitation of patient/patient's resources: Denies Self-Neglect: Denies Values / Beliefs Cultural Requests During Hospitalization: None Spiritual Requests During Hospitalization: None   Advance Directives (For Healthcare) Advance Directive: Patient has advance  directive,  copy not in chart Nutrition Screen- [facility] Adult/[facility]/AP Patient's home diet: Regular  Additional Information 1:1 In Past 12 Months?: No CIRT Risk: No Elopement Risk: No Does patient have medical clearance?: No     Disposition:  Disposition Initial Assessment Completed for this Encounter: Yes Disposition of Patient: Outpatient treatment Type of outpatient treatment: Psych Intensive Outpatient  On Site Evaluation by:   Reviewed with Physician:     Wynona Luna 09/08/2012 10:31 AM

## 2012-09-13 ENCOUNTER — Other Ambulatory Visit (HOSPITAL_COMMUNITY): Payer: BC Managed Care – PPO

## 2012-09-14 ENCOUNTER — Ambulatory Visit (HOSPITAL_COMMUNITY): Payer: Self-pay

## 2012-09-15 ENCOUNTER — Ambulatory Visit (HOSPITAL_COMMUNITY): Payer: Self-pay

## 2012-09-16 ENCOUNTER — Ambulatory Visit (HOSPITAL_COMMUNITY): Payer: Self-pay

## 2012-09-19 ENCOUNTER — Ambulatory Visit (HOSPITAL_COMMUNITY): Payer: Self-pay

## 2012-09-20 ENCOUNTER — Ambulatory Visit (HOSPITAL_COMMUNITY): Payer: Self-pay

## 2012-09-21 ENCOUNTER — Ambulatory Visit (HOSPITAL_COMMUNITY): Payer: Self-pay

## 2012-09-21 ENCOUNTER — Ambulatory Visit (INDEPENDENT_AMBULATORY_CARE_PROVIDER_SITE_OTHER): Payer: BC Managed Care – PPO | Admitting: Family Medicine

## 2012-09-21 VITALS — BP 130/84 | HR 83 | Temp 97.8°F | Resp 18 | Ht 66.5 in | Wt 243.0 lb

## 2012-09-21 DIAGNOSIS — E785 Hyperlipidemia, unspecified: Secondary | ICD-10-CM

## 2012-09-21 DIAGNOSIS — N393 Stress incontinence (female) (male): Secondary | ICD-10-CM

## 2012-09-21 DIAGNOSIS — E282 Polycystic ovarian syndrome: Secondary | ICD-10-CM

## 2012-09-21 DIAGNOSIS — S0500XA Injury of conjunctiva and corneal abrasion without foreign body, unspecified eye, initial encounter: Secondary | ICD-10-CM

## 2012-09-21 DIAGNOSIS — S0502XA Injury of conjunctiva and corneal abrasion without foreign body, left eye, initial encounter: Secondary | ICD-10-CM

## 2012-09-21 DIAGNOSIS — R7309 Other abnormal glucose: Secondary | ICD-10-CM

## 2012-09-21 DIAGNOSIS — R7302 Impaired glucose tolerance (oral): Secondary | ICD-10-CM

## 2012-09-21 DIAGNOSIS — Z79899 Other long term (current) drug therapy: Secondary | ICD-10-CM

## 2012-09-21 LAB — COMPREHENSIVE METABOLIC PANEL
ALT: 14 U/L (ref 0–35)
Alkaline Phosphatase: 74 U/L (ref 39–117)
CO2: 28 mEq/L (ref 19–32)
Creat: 0.98 mg/dL (ref 0.50–1.10)
Total Bilirubin: 0.3 mg/dL (ref 0.3–1.2)

## 2012-09-21 MED ORDER — METFORMIN HCL 500 MG PO TABS: 500.0000 mg | ORAL_TABLET | Freq: Two times a day (BID) | ORAL | Status: DC

## 2012-09-21 MED ORDER — GENTAMICIN SULFATE 0.3 % OP SOLN: 2.0000 [drp] | OPHTHALMIC | Status: DC

## 2012-09-21 MED ORDER — OXYBUTYNIN CHLORIDE ER 10 MG PO TB24: 10.0000 mg | ORAL_TABLET | Freq: Every day | ORAL | Status: DC

## 2012-09-21 MED ORDER — SPIRONOLACTONE 50 MG PO TABS: 50.0000 mg | ORAL_TABLET | Freq: Every day | ORAL | Status: DC

## 2012-09-21 MED ORDER — METFORMIN HCL 500 MG PO TABS: 500.0000 mg | ORAL_TABLET | Freq: Every morning | ORAL | Status: DC

## 2012-09-21 MED ORDER — ATORVASTATIN CALCIUM 40 MG PO TABS: 40.0000 mg | ORAL_TABLET | Freq: Every day | ORAL | Status: DC

## 2012-09-21 NOTE — Addendum Note (Signed)
Addended by: Norberto Sorenson on: 09/21/2012 11:12 AM   Modules accepted: Orders

## 2012-09-21 NOTE — Progress Notes (Signed)
Subjective:    Patient ID: Alicia Porter, female    DOB: [birth year], 42 y.o.   MRN: [id number] Chief Complaint  Patient presents with  . Eye Pain    left eye  . Medication Refill    HPI  Had pain, discomfort under left contact yesterday - usually wear for a straight 2 weeks - napped and when she woke up eye was red and swollen and felt like it was on fire.  Took out contacts and threw them away. Had been in for 2 weeks and 2d.  No photophobia, blurred vision, or vision change.  No further drainage or tearing.  Needs refills on glucophage 500mg  bid and aldactone 50 qd.  6 mos ago a1c was 6.1 and at another clinic very recently it was 5.8.  In [place], lipid panel was to high 6 mos ago - has cut back on high fat foods but was told that if it was still high in 6 months she would need to start on cholesterol medication.  She is not fasting today.  Recently started rispiridal and gained 13 lbs in 2 wks so had to go off of it.  Having a lot of problems with stress incontinence, used to be on some medication which helped a lot from her gynecologist but she doesn't remember what it was.  Past Medical History  Diagnosis Date  . Sleep apnea   . Polycystic ovarian disease   . Polycystic ovarian disease 09/08/2012    takes Aldactone for it  . Depression   . Diabetes mellitus without complication   . Hyperlipidemia    Current Outpatient Prescriptions on File Prior to Visit  Medication Sig Dispense Refill  . lamoTRIgine (LAMICTAL) 100 MG tablet Take 300 mg by mouth daily.      Marland Kitchen lithium carbonate 300 MG capsule Take 900 mg by mouth at bedtime.      Marland Kitchen LORazepam (ATIVAN) 1 MG tablet Take 1 mg by mouth every 8 (eight) hours.      Marland Kitchen LORazepam (ATIVAN) 2 MG tablet Take 2 mg by mouth at bedtime.       No current facility-administered medications on file prior to visit.   Allergies  Allergen Reactions  . Levofloxacin Other (See Comments)    Tendon damage  . Sulfa Antibiotics Nausea And  Vomiting  . Wellbutrin [Bupropion Hcl] Other (See Comments)    "Crawling out of my skin"  . Saphris [Asenapine]     Cant tolerate it, got very irritable and aggressive on it.   Review of Systems  Constitutional: Negative for fever, chills, diaphoresis and activity change.  HENT: Negative for ear pain, congestion, sore throat, rhinorrhea, neck pain, neck stiffness, dental problem, sinus pressure and tinnitus.   Eyes: Positive for pain and redness. Negative for photophobia, discharge, itching and visual disturbance.  Skin: Negative for rash.  Neurological: Negative for dizziness, syncope, facial asymmetry, weakness, light-headedness, numbness and headaches.  Hematological: Negative for adenopathy.       BP 130/84  Pulse 83  Temp(Src) 97.8 F (36.6 C) (Oral)  Resp 18  Ht 5' 6.5" (1.689 m)  Wt 243 lb (110.224 kg)  BMI 38.64 kg/m2  SpO2 97% Objective:   Physical Exam  Constitutional: She is oriented to person, place, and time. She appears well-developed and well-nourished. No distress.  HENT:  Head: Normocephalic and atraumatic.  Right Ear: External ear normal.  Eyes: EOM and lids are normal. Pupils are equal, round, and reactive to light. Right eye exhibits no  chemosis, no discharge, no exudate and no hordeolum. No foreign body present in the right eye. Left eye exhibits chemosis. Left eye exhibits no discharge, no exudate and no hordeolum. No foreign body present in the left eye. Right conjunctiva is not injected. Right conjunctiva has no hemorrhage. Left conjunctiva is injected. Left conjunctiva has no hemorrhage. No scleral icterus. Right eye exhibits normal extraocular motion. Left eye exhibits normal extraocular motion. Pupils are equal.  Slit lamp exam:      The left eye shows corneal abrasion and fluorescein uptake. The left eye shows no corneal ulcer and no foreign body.    Several linear abrasions seen on fluorescein staining distal to iris - around 6 p.m. position    Pulmonary/Chest: Effort normal.  Neurological: She is alert and oriented to person, place, and time.  Skin: Skin is warm and dry. She is not diaphoretic. No erythema.  Psychiatric: She has a normal mood and affect. Her behavior is normal.      Assessment & Plan:  Conjunctival abrasion, left, initial encounter - severe Levaquin allergy so will avoid using flouroquinolones as well as sulfa allergy so will use gent drops but to optho immed if no improvement or worsening.  Encounter for long-term (current) use of other medications - Plan: Comprehensive metabolic panel  Polycystic ovarian syndrome - refilled metformin and aldactone  Glucose intolerance (impaired glucose tolerance) - cont metformin - recheck a1c at f/u  Other and unspecified hyperlipidemia - 13% risk in the next 10 yrs compared to 0.5% with risk reduction and 50% risk in lifetime per the ASCVD risk calculator.  Declines returning for repeat flp at some other point - would just like to start a cholesterol medication.  Will start lipitor - discussed side effects. RTC in 6-8 wks for repeat lfts.  Meds ordered this encounter  Medications  . Lurasidone HCl (LATUDA) 60 MG TABS    Sig: Take by mouth.  Marland Kitchen atorvastatin (LIPITOR) 40 MG tablet    Sig: Take 1 tablet (40 mg total) by mouth at bedtime.    Dispense:  90 tablet    Refill:  0  . DISCONTD: metFORMIN (GLUCOPHAGE) 500 MG tablet    Sig: Take 1 tablet (500 mg total) by mouth every morning.    Dispense:  90 tablet    Refill:  3  . spironolactone (ALDACTONE) 50 MG tablet    Sig: Take 1 tablet (50 mg total) by mouth daily after breakfast.    Dispense:  90 tablet    Refill:  3  . gentamicin (GARAMYCIN) 0.3 % ophthalmic solution    Sig: Place 2 drops into the left eye every 4 (four) hours.    Dispense:  5 mL    Refill:  1  . metFORMIN (GLUCOPHAGE) 500 MG tablet    Sig: Take 1 tablet (500 mg total) by mouth 2 (two) times daily with a meal.    Dispense:  180 tablet    Refill:   3

## 2012-09-22 ENCOUNTER — Ambulatory Visit (HOSPITAL_COMMUNITY): Payer: Self-pay

## 2012-09-22 ENCOUNTER — Encounter: Payer: Self-pay | Admitting: Family Medicine

## 2012-09-23 ENCOUNTER — Ambulatory Visit (HOSPITAL_COMMUNITY): Payer: Self-pay

## 2012-09-26 ENCOUNTER — Ambulatory Visit (HOSPITAL_COMMUNITY): Payer: Self-pay

## 2012-09-27 ENCOUNTER — Ambulatory Visit (HOSPITAL_COMMUNITY): Payer: Self-pay

## 2012-09-28 ENCOUNTER — Ambulatory Visit (HOSPITAL_COMMUNITY): Payer: Self-pay

## 2012-09-29 ENCOUNTER — Ambulatory Visit (HOSPITAL_COMMUNITY): Payer: Self-pay

## 2012-09-30 ENCOUNTER — Ambulatory Visit (HOSPITAL_COMMUNITY): Payer: Self-pay

## 2012-10-03 ENCOUNTER — Ambulatory Visit (HOSPITAL_COMMUNITY): Payer: Self-pay

## 2012-10-04 ENCOUNTER — Ambulatory Visit (HOSPITAL_COMMUNITY): Payer: Self-pay

## 2012-10-05 ENCOUNTER — Ambulatory Visit (HOSPITAL_COMMUNITY): Payer: Self-pay

## 2012-10-06 ENCOUNTER — Ambulatory Visit (HOSPITAL_COMMUNITY): Payer: Self-pay

## 2012-10-07 ENCOUNTER — Ambulatory Visit (HOSPITAL_COMMUNITY): Payer: Self-pay

## 2012-10-11 ENCOUNTER — Ambulatory Visit (HOSPITAL_COMMUNITY): Payer: Self-pay

## 2012-10-12 ENCOUNTER — Ambulatory Visit (HOSPITAL_COMMUNITY): Payer: Self-pay

## 2012-10-13 ENCOUNTER — Ambulatory Visit (HOSPITAL_COMMUNITY): Payer: Self-pay

## 2012-10-14 ENCOUNTER — Ambulatory Visit (HOSPITAL_COMMUNITY): Payer: Self-pay

## 2012-10-17 ENCOUNTER — Ambulatory Visit (HOSPITAL_COMMUNITY): Payer: Self-pay

## 2012-10-18 ENCOUNTER — Ambulatory Visit (HOSPITAL_COMMUNITY): Payer: Self-pay

## 2012-11-04 ENCOUNTER — Ambulatory Visit: Payer: BC Managed Care – PPO | Admitting: Family Medicine

## 2012-12-09 ENCOUNTER — Ambulatory Visit (INDEPENDENT_AMBULATORY_CARE_PROVIDER_SITE_OTHER): Payer: BC Managed Care – PPO | Admitting: Family Medicine

## 2012-12-09 ENCOUNTER — Ambulatory Visit: Payer: BC Managed Care – PPO

## 2012-12-09 ENCOUNTER — Encounter: Payer: Self-pay | Admitting: Family Medicine

## 2012-12-09 VITALS — BP 114/70 | HR 74 | Temp 98.3°F | Resp 16 | Ht 66.0 in | Wt 234.2 lb

## 2012-12-09 DIAGNOSIS — R0789 Other chest pain: Secondary | ICD-10-CM

## 2012-12-09 DIAGNOSIS — R0602 Shortness of breath: Secondary | ICD-10-CM

## 2012-12-09 DIAGNOSIS — Z79899 Other long term (current) drug therapy: Secondary | ICD-10-CM | POA: Insufficient documentation

## 2012-12-09 DIAGNOSIS — R05 Cough: Secondary | ICD-10-CM

## 2012-12-09 DIAGNOSIS — D7589 Other specified diseases of blood and blood-forming organs: Secondary | ICD-10-CM

## 2012-12-09 DIAGNOSIS — E119 Type 2 diabetes mellitus without complications: Secondary | ICD-10-CM

## 2012-12-09 DIAGNOSIS — F17200 Nicotine dependence, unspecified, uncomplicated: Secondary | ICD-10-CM

## 2012-12-09 DIAGNOSIS — R5381 Other malaise: Secondary | ICD-10-CM

## 2012-12-09 DIAGNOSIS — N393 Stress incontinence (female) (male): Secondary | ICD-10-CM

## 2012-12-09 DIAGNOSIS — R059 Cough, unspecified: Secondary | ICD-10-CM

## 2012-12-09 DIAGNOSIS — E282 Polycystic ovarian syndrome: Secondary | ICD-10-CM

## 2012-12-09 LAB — POCT CBC
Lymph, poc: 1.7 (ref 0.6–3.4)
MCH, POC: 32.1 pg — AB (ref 27–31.2)
MCHC: 31.3 g/dL — AB (ref 31.8–35.4)
MID (cbc): 0.4 (ref 0–0.9)
MPV: 8.1 fL (ref 0–99.8)
POC MID %: 4.3 %M (ref 0–12)
Platelet Count, POC: 300 10*3/uL (ref 142–424)
RBC: 4.17 M/uL (ref 4.04–5.48)
WBC: 9.1 10*3/uL (ref 4.6–10.2)

## 2012-12-09 LAB — COMPREHENSIVE METABOLIC PANEL
AST: 9 U/L (ref 0–37)
Alkaline Phosphatase: 73 U/L (ref 39–117)
BUN: 11 mg/dL (ref 6–23)
Creat: 0.73 mg/dL (ref 0.50–1.10)

## 2012-12-09 LAB — PULMONARY FUNCTION TEST

## 2012-12-09 LAB — FOLATE: Folate: 12.9 ng/mL

## 2012-12-09 MED ORDER — TOLTERODINE TARTRATE ER 4 MG PO CP24: 4.0000 mg | ORAL_CAPSULE | Freq: Every day | ORAL | Status: DC

## 2012-12-09 MED ORDER — ALBUTEROL SULFATE HFA 108 (90 BASE) MCG/ACT IN AERS: 2.0000 | INHALATION_SPRAY | RESPIRATORY_TRACT | Status: DC | PRN

## 2012-12-09 MED ORDER — ATORVASTATIN CALCIUM 40 MG PO TABS: 40.0000 mg | ORAL_TABLET | Freq: Every day | ORAL | Status: DC

## 2012-12-09 NOTE — Progress Notes (Signed)
Subjective:    Patient ID: Alicia Porter, female    DOB: 1970-04-30, 42 y.o.   MRN: [id number] Chief Complaint  Patient presents with  . Liver enzymes bloodwork  . would like to discuss other things, not stated    HPI  Wearing nicotine patch but has developed a bad cough for the past several months.  Is rarely coughing anything up - feels very tight in her throat.  Is having some SHoB.  No wheezing.  Not tried any otc meds.  Does not have a meter at home.    No prob w/ lipitor.  Has lost 10 lbs and wants to start walking.  Has never had her lithium level drawn - takes a dose at night only - last done about 14 hrs ago. Sees Dr. Evelene Croon  Past Medical History  Diagnosis Date  . Sleep apnea   . Polycystic ovarian disease   . Polycystic ovarian disease 09/08/2012    takes Aldactone for it  . Depression   . Diabetes mellitus without complication   . Hyperlipidemia    Current Outpatient Prescriptions on File Prior to Visit  Medication Sig Dispense Refill  . lamoTRIgine (LAMICTAL) 100 MG tablet Take 300 mg by mouth daily.      Marland Kitchen lithium carbonate 300 MG capsule Take 900 mg by mouth at bedtime.      Marland Kitchen LORazepam (ATIVAN) 1 MG tablet Take 1 mg by mouth every 8 (eight) hours.      Marland Kitchen LORazepam (ATIVAN) 2 MG tablet Take 2 mg by mouth at bedtime.      . metFORMIN (GLUCOPHAGE) 500 MG tablet Take 1 tablet (500 mg total) by mouth 2 (two) times daily with a meal.  180 tablet  3  . spironolactone (ALDACTONE) 50 MG tablet Take 1 tablet (50 mg total) by mouth daily after breakfast.  90 tablet  3   No current facility-administered medications on file prior to visit.   Allergies  Allergen Reactions  . Levofloxacin Other (See Comments)    Tendon damage  . Sulfa Antibiotics Nausea And Vomiting  . Wellbutrin [Bupropion Hcl] Other (See Comments)    "Crawling out of my skin"  . Saphris [Asenapine]     Cant tolerate it, got very irritable and aggressive on it.    Review of Systems   Constitutional: Positive for fatigue. Negative for fever, chills, activity change, appetite change and unexpected weight change.  HENT: Negative for postnasal drip, sore throat and voice change.   Respiratory: Positive for cough, chest tightness and shortness of breath. Negative for wheezing.   Genitourinary: Positive for urgency and enuresis. Negative for dysuria and difficulty urinating.  Psychiatric/Behavioral: Positive for dysphoric mood. The patient is nervous/anxious.       BP 114/70  Pulse 74  Temp(Src) 98.3 F (36.8 C) (Oral)  Resp 16  Ht 5\' 6"  (1.676 m)  Wt 234 lb 3.2 oz (106.232 kg)  BMI 37.82 kg/m2  SpO2 98%  Objective:   Physical Exam  Constitutional: She is oriented to person, place, and time. She appears well-developed and well-nourished. No distress.  HENT:  Head: Normocephalic and atraumatic.  Right Ear: External ear normal.  Left Ear: External ear normal.  Eyes: Conjunctivae are normal. No scleral icterus.  Neck: Normal range of motion. Neck supple. No thyromegaly present.  Cardiovascular: Normal rate, regular rhythm, normal heart sounds and intact distal pulses.   Pulmonary/Chest: Effort normal and breath sounds normal. No respiratory distress.  Musculoskeletal: She exhibits no edema.  Lymphadenopathy:    She has no cervical adenopathy.  Neurological: She is alert and oriented to person, place, and time.  Skin: Skin is warm and dry. She is not diaphoretic. No erythema.  Psychiatric: She has a normal mood and affect. Her behavior is normal.          Results for orders placed in visit on 12/09/12  POCT CBC      Result Value Range   WBC 9.1  4.6 - 10.2 K/uL   Lymph, poc 1.7  0.6 - 3.4   POC LYMPH PERCENT 18.4  10 - 50 %L   MID (cbc) 0.4  0 - 0.9   POC MID % 4.3  0 - 12 %M   POC Granulocyte 7.0 (*) 2 - 6.9   Granulocyte percent 77.3  37 - 80 %G   RBC 4.17  4.04 - 5.48 M/uL   Hemoglobin 13.4  12.2 - 16.2 g/dL   HCT, POC 96.0  45.4 - 47.9 %   MCV  102.6 (*) 80 - 97 fL   MCH, POC 32.1 (*) 27 - 31.2 pg   MCHC 31.3 (*) 31.8 - 35.4 g/dL   RDW, POC 09.8     Platelet Count, POC 300  142 - 424 K/uL   MPV 8.1  0 - 99.8 fL  POCT GLYCOSYLATED HEMOGLOBIN (HGB A1C)      Result Value Range   Hemoglobin A1C 5.5     EKG: NSR, no ischemic changes CXR: no acute abnormality PFTs:FVC 97% pred; FEV1 89% pred; FEV1/FVC 73.4% Assessment & Plan:  Cough - Plan: POCT CBC, DG Chest 2 View, Lithium level - will do therapeutic trial of albuterol - smoker's cough but likely above w/u reassuring. If conts, rec pulm referral  Tobacco use disorder  Chest tightness - Plan: POCT CBC, POCT glycosylated hemoglobin (Hb A1C), Comprehensive metabolic panel, DG Chest 2 View, EKG 12-Lead, Pulmonary function test, Pulmonary function test, Lithium level  Shortness of breath - Plan: Pulmonary function test, Pulmonary function test, Lithium level  Encounter for long-term (current) use of other medications  PCOS (polycystic ovarian syndrome) - cont aldactone  Type II or unspecified type diabetes mellitus without mention of complication, not stated as uncontrolled - a1c completely nml!!! Doing great.  Macrocytosis without anemia - Plan: Vitamin B12, Folate  Other malaise and fatigue - Plan: Vitamin B12, Folate  Stress incontinence - start trial of detrol   Meds ordered this encounter  Medications  . PRESCRIPTION MEDICATION    Sig: Saphris 10 mg taking at night  . albuterol (PROVENTIL HFA;VENTOLIN HFA) 108 (90 BASE) MCG/ACT inhaler    Sig: Inhale 2 puffs into the lungs every 4 (four) hours as needed for wheezing (cough, shortness of breath or wheezing.).    Dispense:  1 Inhaler    Refill:  1  . atorvastatin (LIPITOR) 40 MG tablet    Sig: Take 1 tablet (40 mg total) by mouth at bedtime.    Dispense:  90 tablet    Refill:  3  . tolterodine (DETROL LA) 4 MG 24 hr capsule    Sig: Take 1 capsule (4 mg total) by mouth daily.    Dispense:  90 capsule    Refill:  1    Norberto Sorenson, MD MPH

## 2012-12-09 NOTE — Patient Instructions (Signed)
Try to start a vitamin b12 supplement of vitamin B complex supplement in the morning with food.  We will get your labs back and see how low you are.  Smoking Hazards Smoking cigarettes is extremely bad for your health. Tobacco smoke has over 200 known poisons in it. There are over 60 chemicals in tobacco smoke that cause cancer. Some of the chemicals found in cigarette smoke include:   Cyanide.  Benzene.  Formaldehyde.  Methanol (wood alcohol).  Acetylene (fuel used in welding torches).  Ammonia. Cigarette smoke also contains the poisonous gases nitrogen oxide and carbon monoxide.  Cigarette smokers have an increased risk of many serious medical problems, including:  Lung cancer.  Lung disease (such as pneumonia, bronchitis, and emphysema).  Heart attack and chest pain due to the heart not getting enough oxygen (angina).  Heart disease and peripheral blood vessel disease.  Hypertension.  Stroke.  Oral cancer (cancer of the lip, mouth, or voice box).  Bladder cancer.  Pancreatic cancer.  Cervical cancer.  Pregnancy complications, including premature birth.  Low birthweight babies.  Early menopause.  Lower estrogen level for women.  Infertility.  Facial wrinkles.  Blindness.  Increased risk of broken bones (fractures).  Senile dementia.  Stillbirths and smaller newborn babies, birth defects, and genetic damage to sperm.  Stomach ulcers and internal bleeding. Children of smokers have an increased risk of the following, because of secondhand smoke exposure:   Sudden infant death syndrome (SIDS).  Respiratory infections.  Lung cancer.  Heart disease.  Ear infections. Smoking causes approximately:  90% of all lung cancer deaths in men.  80% of all lung cancer deaths in women.  90% of deaths from chronic obstructive lung disease. Compared with nonsmokers, smoking increases the risk of:  Coronary heart disease by 2 to 4 times.  Stroke by 2 to  4 times.  Men developing lung cancer by 23 times.  Women developing lung cancer by 13 times.  Dying from chronic obstructive lung diseases by 12 times. Someone who smokes 2 packs a day loses about 8 years of his or her life. Even smoking lightly shortens your life expectancy by several years. You can greatly reduce the risk of medical problems for you and your family by stopping now. Smoking is the most preventable cause of death and disease in our society. Within days of quitting smoking, your circulation returns to normal, you decrease the risk of having a heart attack, and your lung capacity improves. There may be some increased phlegm in the first few days after quitting, and it may take months for your lungs to clear up completely. Quitting for 10 years cuts your lung cancer risk to almost that of a nonsmoker. WHY IS SMOKING ADDICTIVE?  Nicotine is the chemical agent in tobacco that is capable of causing addiction or dependence.  When you smoke and inhale, nicotine is absorbed rapidly into the bloodstream through your lungs. Nicotine absorbed through the lungs is capable of creating a powerful addiction. Both inhaled and non-inhaled nicotine may be addictive.  Addiction studies of cigarettes and spit tobacco show that addiction to nicotine occurs mainly during the teen years, when young people begin using tobacco products. WHAT ARE THE BENEFITS OF QUITTING?  There are many health benefits to quitting smoking.   Likelihood of developing cancer and heart disease decreases. Health improvements are seen almost immediately.  Blood pressure, pulse rate, and breathing patterns start returning to normal soon after quitting.  People who quit may see an improvement  in their overall quality of life. Some people choose to quit all at once. Other options include nicotine replacement products, such as patches, gum, and nasal sprays. Do not use these products without first checking with your  caregiver. QUITTING SMOKING It is not easy to quit smoking. Nicotine is addicting, and longtime habits are hard to change. To start, you can write down all your reasons for quitting, tell your family and friends you want to quit, and ask for their help. Throw your cigarettes away, chew gum or cinnamon sticks, keep your hands busy, and drink extra water or juice. Go for walks and practice deep breathing to relax. Think of all the money you are saving: around $1,000 a year, for the average pack-a-day smoker. Nicotine patches and gum have been shown to improve success at efforts to stop smoking. Zyban (bupropion) is an anti-depressant drug that can be prescribed to reduce nicotine withdrawal symptoms and to suppress the urge to smoke. Smoking is an addiction with both physical and psychological effects. Joining a stop-smoking support group can help you cope with the emotional issues. For more information and advice on programs to stop smoking, call your doctor, your local hospital, or these organizations:  American Lung Association - 1-800-LUNGUSA  American Cancer Society - 1-800-ACS-2345 Document Released: 03/05/2004 Document Revised: 04/20/2011 Document Reviewed: 11/07/2008 [facility] Patient Information 2014 [place], [place].

## 2012-12-19 ENCOUNTER — Encounter: Payer: Self-pay | Admitting: Family Medicine

## 2012-12-20 ENCOUNTER — Encounter: Payer: Self-pay | Admitting: Family Medicine

## 2013-02-24 DIAGNOSIS — Z0271 Encounter for disability determination: Secondary | ICD-10-CM

## 2013-03-29 ENCOUNTER — Ambulatory Visit: Payer: BC Managed Care – PPO | Admitting: Family Medicine

## 2013-04-10 ENCOUNTER — Ambulatory Visit (INDEPENDENT_AMBULATORY_CARE_PROVIDER_SITE_OTHER): Payer: 59 | Admitting: Emergency Medicine

## 2013-04-10 VITALS — BP 118/72 | HR 81 | Temp 98.0°F | Resp 16 | Ht 65.0 in | Wt 239.0 lb

## 2013-04-10 DIAGNOSIS — M722 Plantar fascial fibromatosis: Secondary | ICD-10-CM

## 2013-04-10 DIAGNOSIS — E782 Mixed hyperlipidemia: Secondary | ICD-10-CM

## 2013-04-10 DIAGNOSIS — E119 Type 2 diabetes mellitus without complications: Secondary | ICD-10-CM

## 2013-04-10 DIAGNOSIS — G43909 Migraine, unspecified, not intractable, without status migrainosus: Secondary | ICD-10-CM

## 2013-04-10 LAB — POCT URINALYSIS DIPSTICK
Bilirubin, UA: NEGATIVE
GLUCOSE UA: NEGATIVE
Ketones, UA: NEGATIVE
Leukocytes, UA: NEGATIVE
Nitrite, UA: NEGATIVE
Protein, UA: NEGATIVE
Spec Grav, UA: <=1.005
UROBILINOGEN UA: 0.2
pH, UA: 6.5

## 2013-04-10 LAB — POCT UA - MICROSCOPIC ONLY
CRYSTALS, UR, HPF, POC: NEGATIVE
Casts, Ur, LPF, POC: NEGATIVE
Mucus, UA: NEGATIVE
WBC, Ur, HPF, POC: NEGATIVE
Yeast, UA: NEGATIVE

## 2013-04-10 MED ORDER — MELOXICAM 15 MG PO TABS: 15.0000 mg | ORAL_TABLET | Freq: Every day | ORAL | Status: DC

## 2013-04-10 MED ORDER — ELETRIPTAN HYDROBROMIDE 40 MG PO TABS: 40.0000 mg | ORAL_TABLET | ORAL | Status: DC | PRN

## 2013-04-10 NOTE — Patient Instructions (Signed)
Plantar Fasciitis (Heel Spur Syndrome) with Rehab The plantar fascia is a fibrous, ligament-like, soft-tissue structure that spans the bottom of the foot. Plantar fasciitis is a condition that causes pain in the foot due to inflammation of the tissue. SYMPTOMS   Pain and tenderness on the underneath side of the foot.  Pain that worsens with standing or walking. CAUSES  Plantar fasciitis is caused by irritation and injury to the plantar fascia on the underneath side of the foot. Common mechanisms of injury include:  Direct trauma to bottom of the foot.  Damage to a small nerve that runs under the foot where the main fascia attaches to the heel bone.  Stress placed on the plantar fascia due to bone spurs. RISK INCREASES WITH:   Activities that place stress on the plantar fascia (running, jumping, pivoting, or cutting).  Poor strength and flexibility.  Improperly fitted shoes.  Tight calf muscles.  Flat feet.  Failure to warm-up properly before activity.  Obesity. PREVENTION  Warm up and stretch properly before activity.  Allow for adequate recovery between workouts.  Maintain physical fitness:  Strength, flexibility, and endurance.  Cardiovascular fitness.  Maintain a health body weight.  Avoid stress on the plantar fascia.  Wear properly fitted shoes, including arch supports for individuals who have flat feet. PROGNOSIS  If treated properly, then the symptoms of plantar fasciitis usually resolve without surgery. However, occasionally surgery is necessary. RELATED COMPLICATIONS   Recurrent symptoms that may result in a chronic condition.  Problems of the lower back that are caused by compensating for the injury, such as limping.  Pain or weakness of the foot during push-off following surgery.  Chronic inflammation, scarring, and partial or complete fascia tear, occurring more often from repeated injections. TREATMENT  Treatment initially involves the use of  ice and medication to help reduce pain and inflammation. The use of strengthening and stretching exercises may help reduce pain with activity, especially stretches of the Achilles tendon. These exercises may be performed at home or with a therapist. Your caregiver may recommend that you use heel cups of arch supports to help reduce stress on the plantar fascia. Occasionally, corticosteroid injections are given to reduce inflammation. If symptoms persist for greater than 6 months despite non-surgical (conservative), then surgery may be recommended.  MEDICATION   If pain medication is necessary, then nonsteroidal anti-inflammatory medications, such as aspirin and ibuprofen, or other minor pain relievers, such as acetaminophen, are often recommended.  Do not take pain medication within 7 days before surgery.  Prescription pain relievers may be given if deemed necessary by your caregiver. Use only as directed and only as much as you need.  Corticosteroid injections may be given by your caregiver. These injections should be reserved for the most serious cases, because they may only be given a certain number of times. HEAT AND COLD  Cold treatment (icing) relieves pain and reduces inflammation. Cold treatment should be applied for 10 to 15 minutes every 2 to 3 hours for inflammation and pain and immediately after any activity that aggravates your symptoms. Use ice packs or massage the area with a piece of ice (ice massage).  Heat treatment may be used prior to performing the stretching and strengthening activities prescribed by your caregiver, physical therapist, or athletic trainer. Use a heat pack or soak the injury in warm water. SEEK IMMEDIATE MEDICAL CARE IF:  Treatment seems to offer no benefit, or the condition worsens.  Any medications produce adverse side effects. EXERCISES RANGE   OF MOTION (ROM) AND STRETCHING EXERCISES - Plantar Fasciitis (Heel Spur Syndrome) These exercises may help you  when beginning to rehabilitate your injury. Your symptoms may resolve with or without further involvement from your physician, physical therapist or athletic trainer. While completing these exercises, remember:   Restoring tissue flexibility helps normal motion to return to the joints. This allows healthier, less painful movement and activity.  An effective stretch should be held for at least 30 seconds.  A stretch should never be painful. You should only feel a gentle lengthening or release in the stretched tissue. RANGE OF MOTION - Toe Extension, Flexion  Sit with your right / left leg crossed over your opposite knee.  Grasp your toes and gently pull them back toward the top of your foot. You should feel a stretch on the bottom of your toes and/or foot.  Hold this stretch for __________ seconds.  Now, gently pull your toes toward the bottom of your foot. You should feel a stretch on the top of your toes and or foot.  Hold this stretch for __________ seconds. Repeat __________ times. Complete this stretch __________ times per day.  RANGE OF MOTION - Ankle Dorsiflexion, Active Assisted  Remove shoes and sit on a chair that is preferably not on a carpeted surface.  Place right / left foot under knee. Extend your opposite leg for support.  Keeping your heel down, slide your right / left foot back toward the chair until you feel a stretch at your ankle or calf. If you do not feel a stretch, slide your bottom forward to the edge of the chair, while still keeping your heel down.  Hold this stretch for __________ seconds. Repeat __________ times. Complete this stretch __________ times per day.  STRETCH  Gastroc, Standing  Place hands on wall.  Extend right / left leg, keeping the front knee somewhat bent.  Slightly point your toes inward on your back foot.  Keeping your right / left heel on the floor and your knee straight, shift your weight toward the wall, not allowing your back to  arch.  You should feel a gentle stretch in the right / left calf. Hold this position for __________ seconds. Repeat __________ times. Complete this stretch __________ times per day. STRETCH  Soleus, Standing  Place hands on wall.  Extend right / left leg, keeping the other knee somewhat bent.  Slightly point your toes inward on your back foot.  Keep your right / left heel on the floor, bend your back knee, and slightly shift your weight over the back leg so that you feel a gentle stretch deep in your back calf.  Hold this position for __________ seconds. Repeat __________ times. Complete this stretch __________ times per day. STRETCH  Gastrocsoleus, Standing  Note: This exercise can place a lot of stress on your foot and ankle. Please complete this exercise only if specifically instructed by your caregiver.   Place the ball of your right / left foot on a step, keeping your other foot firmly on the same step.  Hold on to the wall or a rail for balance.  Slowly lift your other foot, allowing your body weight to press your heel down over the edge of the step.  You should feel a stretch in your right / left calf.  Hold this position for __________ seconds.  Repeat this exercise with a slight bend in your right / left knee. Repeat __________ times. Complete this stretch __________ times per day.    STRENGTHENING EXERCISES - Plantar Fasciitis (Heel Spur Syndrome)  These exercises may help you when beginning to rehabilitate your injury. They may resolve your symptoms with or without further involvement from your physician, physical therapist or athletic trainer. While completing these exercises, remember:   Muscles can gain both the endurance and the strength needed for everyday activities through controlled exercises.  Complete these exercises as instructed by your physician, physical therapist or athletic trainer. Progress the resistance and repetitions only as guided. STRENGTH - Towel  Curls  Sit in a chair positioned on a non-carpeted surface.  Place your foot on a towel, keeping your heel on the floor.  Pull the towel toward your heel by only curling your toes. Keep your heel on the floor.  If instructed by your physician, physical therapist or athletic trainer, add ____________________ at the end of the towel. Repeat __________ times. Complete this exercise __________ times per day. STRENGTH - Ankle Inversion  Secure one end of a rubber exercise band/tubing to a fixed object (table, pole). Loop the other end around your foot just before your toes.  Place your fists between your knees. This will focus your strengthening at your ankle.  Slowly, pull your big toe up and in, making sure the band/tubing is positioned to resist the entire motion.  Hold this position for __________ seconds.  Have your muscles resist the band/tubing as it slowly pulls your foot back to the starting position. Repeat __________ times. Complete this exercises __________ times per day.  Document Released: 01/26/2005 Document Revised: 04/20/2011 Document Reviewed: 05/10/2008 [employer] Patient Information 2014 [employer]. Plantar Fasciitis Plantar fasciitis is a common condition that causes foot pain. It is soreness (inflammation) of the band of tough fibrous tissue on the bottom of the foot that runs from the heel bone (calcaneus) to the ball of the foot. The cause of this soreness may be from excessive standing, poor fitting shoes, running on hard surfaces, being overweight, having an abnormal walk, or overuse (this is common in runners) of the painful foot or feet. It is also common in aerobic exercise dancers and ballet dancers. SYMPTOMS  Most people with plantar fasciitis complain of:  Severe pain in the morning on the bottom of their foot especially when taking the first steps out of bed. This pain recedes after a few minutes of walking.  Severe pain is experienced also during walking  following a long period of inactivity.  Pain is worse when walking barefoot or up stairs DIAGNOSIS   Your caregiver will diagnose this condition by examining and feeling your foot.  Special tests such as X-rays of your foot, are usually not needed. PREVENTION   Consult a sports medicine professional before beginning a new exercise program.  Walking programs offer a good workout. With walking there is a lower chance of overuse injuries common to runners. There is less impact and less jarring of the joints.  Begin all new exercise programs slowly. If problems or pain develop, decrease the amount of time or distance until you are at a comfortable level.  Wear good shoes and replace them regularly.  Stretch your foot and the heel cords at the back of the ankle (Achilles tendon) both before and after exercise.  Run or exercise on even surfaces that are not hard. For example, asphalt is better than pavement.  Do not run barefoot on hard surfaces.  If using a treadmill, vary the incline.  Do not continue to workout if you have foot or joint   problems. Seek professional help if they do not improve. HOME CARE INSTRUCTIONS   Avoid activities that cause you pain until you recover.  Use ice or cold packs on the problem or painful areas after working out.  Only take over-the-counter or prescription medicines for pain, discomfort, or fever as directed by your caregiver.  Soft shoe inserts or athletic shoes with air or gel sole cushions may be helpful.  If problems continue or become more severe, consult a sports medicine caregiver or your own health care provider. Cortisone is a potent anti-inflammatory medication that may be injected into the painful area. You can discuss this treatment with your caregiver. MAKE SURE YOU:   Understand these instructions.  Will watch your condition.  Will get help right away if you are not doing well or get worse. Document Released: 10/21/2000 Document  Revised: 04/20/2011 Document Reviewed: 12/21/2007 [employer] Patient Information 2014 [employer].  

## 2013-04-10 NOTE — Progress Notes (Addendum)
[facility] [address], [place] [phone]  Date:  04/10/2013   Name:  Alicia LeverSharon A Porter   DOB:  10/10/1970   MRN:  [id number]  PCP:  Default, Provider, MD    Chief Complaint: Migraine   History of Present Illness:  Alicia Porter is a 43 y.o. very pleasant female patient who presents with the following:  History of plantar fasciitis and was considered for failure to improve.  Lost insurance and failed to follow up.  Now having increased pain.  No injury or overuse History of hyperlipidemia off medication for a year. Has migraine headaches and was given a zomig and the pain vanished.  Wants to be considered for treatment.  No improvement with over the counter medications or other home remedies. Denies other complaint or health concern today.   Patient Active Problem List   Diagnosis Date Noted  . PCOS (polycystic ovarian syndrome) 12/09/2012  . Encounter for long-term (current) use of other medications 12/09/2012  . Tobacco use disorder 12/09/2012  . Type II or unspecified type diabetes mellitus without mention of complication, not stated as uncontrolled 12/09/2012  . Major depressive disorder, recurrent 01/24/2011  . Generalized anxiety disorder 01/24/2011  . Borderline personality disorder 01/24/2011    Past Medical History  Diagnosis Date  . Sleep apnea   . Polycystic ovarian disease   . Polycystic ovarian disease 09/08/2012    takes Aldactone for it  . Depression   . Diabetes mellitus without complication   . Hyperlipidemia     History reviewed. No pertinent past surgical history.  History  Substance Use Topics  . Smoking status: Current Every Day Smoker -- 1.00 packs/day  . Smokeless tobacco: Not on file  . Alcohol Use: No     Comment: history of alcohol use esp when manic none recently    Family History  Problem Relation Age of Onset  . Heart disease Mother     Allergies  Allergen Reactions  . Levofloxacin Other  (See Comments)    Tendon damage  . Sulfa Antibiotics Nausea And Vomiting  . Wellbutrin [Bupropion Hcl] Other (See Comments)    "Crawling out of my skin"  . Saphris [Asenapine]     Cant tolerate it, got very irritable and aggressive on it.    Medication list has been reviewed and updated.  Current Outpatient Prescriptions on File Prior to Visit  Medication Sig Dispense Refill  . albuterol (PROVENTIL HFA;VENTOLIN HFA) 108 (90 BASE) MCG/ACT inhaler Inhale 2 puffs into the lungs every 4 (four) hours as needed for wheezing (cough, shortness of breath or wheezing.).  1 Inhaler  1  . atorvastatin (LIPITOR) 40 MG tablet Take 1 tablet (40 mg total) by mouth at bedtime.  90 tablet  3  . lithium carbonate 300 MG capsule Take 900 mg by mouth at bedtime.      Marland Kitchen. LORazepam (ATIVAN) 1 MG tablet Take 1 mg by mouth every 8 (eight) hours.      Marland Kitchen. LORazepam (ATIVAN) 2 MG tablet Take 2 mg by mouth at bedtime.      . metFORMIN (GLUCOPHAGE) 500 MG tablet Take 1 tablet (500 mg total) by mouth 2 (two) times daily with a meal.  180 tablet  3  . PRESCRIPTION MEDICATION Saphris 10 mg taking at night      . spironolactone (ALDACTONE) 50 MG tablet Take 1 tablet (50 mg total) by mouth daily after breakfast.  90 tablet  3  .  tolterodine (DETROL LA) 4 MG 24 hr capsule Take 1 capsule (4 mg total) by mouth daily.  90 capsule  1  . lamoTRIgine (LAMICTAL) 100 MG tablet Take 300 mg by mouth daily.       No current facility-administered medications on file prior to visit.    Review of Systems:  As per HPI, otherwise negative.    Physical Examination: Filed Vitals:   04/10/13 1118  BP: 118/72  Pulse: 81  Temp: 98 F (36.7 C)  Resp: 16   Filed Vitals:   04/10/13 1118  Height: 5\' 5"  (1.651 m)  Weight: 239 lb (108.41 kg)   Body mass index is 39.77 kg/(m^2). Ideal Body Weight: Weight in (lb) to have BMI = 25: 149.9  GEN: WDWN, NAD, Non-toxic, A & O x 3 HEENT: Atraumatic, Normocephalic. Neck supple. No masses,  No LAD. Ears and Nose: No external deformity. CV: RRR, No M/G/R. No JVD. No thrill. No extra heart sounds. PULM: CTA B, no wheezes, crackles, rhonchi. No retractions. No resp. distress. No accessory muscle use. ABD: S, NT, ND, +BS. No rebound. No HSM. EXTR: No c/c/e NEURO Normal gait.  PSYCH: Normally interactive. Conversant. Not depressed or anxious appearing.  Calm demeanor.    Assessment and Plan: Plantar fasciitis NIDDM HLD Migraine  Signed,  Phillips Odor, MD   Results for orders placed in visit on 04/10/13  POCT URINALYSIS DIPSTICK      Result Value Ref Range   Color, UA yellow     Clarity, UA clear     Glucose, UA neg     Bilirubin, UA neg     Ketones, UA neg     Spec Grav, UA <=1.005     Blood, UA small     pH, UA 6.5     Protein, UA neg     Urobilinogen, UA 0.2     Nitrite, UA neg     Leukocytes, UA Negative    POCT UA - MICROSCOPIC ONLY      Result Value Ref Range   WBC, Ur, HPF, POC neg     RBC, urine, microscopic 1-3     Bacteria, U Microscopic trace     Mucus, UA neg     Epithelial cells, urine per micros 0-3     Crystals, Ur, HPF, POC neg     Casts, Ur, LPF, POC neg     Yeast, UA neg

## 2013-04-11 ENCOUNTER — Other Ambulatory Visit (INDEPENDENT_AMBULATORY_CARE_PROVIDER_SITE_OTHER): Payer: 59 | Admitting: *Deleted

## 2013-04-11 DIAGNOSIS — E782 Mixed hyperlipidemia: Secondary | ICD-10-CM

## 2013-04-11 DIAGNOSIS — E119 Type 2 diabetes mellitus without complications: Secondary | ICD-10-CM

## 2013-04-11 DIAGNOSIS — M722 Plantar fascial fibromatosis: Secondary | ICD-10-CM

## 2013-04-11 DIAGNOSIS — G43909 Migraine, unspecified, not intractable, without status migrainosus: Secondary | ICD-10-CM

## 2013-04-11 LAB — POCT GLYCOSYLATED HEMOGLOBIN (HGB A1C): HEMOGLOBIN A1C: 5.6

## 2013-04-11 LAB — COMPREHENSIVE METABOLIC PANEL
ALBUMIN: 4.4 g/dL (ref 3.5–5.2)
ALK PHOS: 83 U/L (ref 39–117)
ALT: 15 U/L (ref 0–35)
AST: 13 U/L (ref 0–37)
BUN: 10 mg/dL (ref 6–23)
CO2: 25 mEq/L (ref 19–32)
Calcium: 9.3 mg/dL (ref 8.4–10.5)
Chloride: 100 mEq/L (ref 96–112)
Creat: 0.84 mg/dL (ref 0.50–1.10)
Glucose, Bld: 161 mg/dL — ABNORMAL HIGH (ref 70–99)
Potassium: 4.1 mEq/L (ref 3.5–5.3)
Sodium: 134 mEq/L — ABNORMAL LOW (ref 135–145)
Total Bilirubin: 0.6 mg/dL (ref 0.2–1.2)
Total Protein: 6.8 g/dL (ref 6.0–8.3)

## 2013-04-11 LAB — LIPID PANEL
Cholesterol: 115 mg/dL (ref 0–200)
HDL: 40 mg/dL (ref >39)
LDL Cholesterol: 50 mg/dL (ref 0–99)
Total CHOL/HDL Ratio: 2.9 Ratio
Triglycerides: 127 mg/dL (ref <150)
VLDL: 25 mg/dL (ref 0–40)

## 2013-04-11 LAB — TSH: TSH: 1.771 u[IU]/mL (ref 0.350–4.500)

## 2013-04-11 NOTE — Progress Notes (Signed)
Pt here for lab work only

## 2013-04-13 ENCOUNTER — Other Ambulatory Visit: Payer: Self-pay | Admitting: Emergency Medicine

## 2013-04-13 MED ORDER — METFORMIN HCL ER 500 MG PO TB24: ORAL_TABLET | ORAL | Status: DC

## 2013-05-26 ENCOUNTER — Ambulatory Visit: Payer: 59 | Admitting: Podiatrist

## 2013-06-09 ENCOUNTER — Ambulatory Visit: Payer: BC Managed Care – PPO | Admitting: Family Medicine

## 2013-08-02 ENCOUNTER — Telehealth: Payer: Self-pay

## 2013-08-02 DIAGNOSIS — E1059 Type 1 diabetes mellitus with other circulatory complications: Secondary | ICD-10-CM

## 2013-08-02 DIAGNOSIS — E119 Type 2 diabetes mellitus without complications: Secondary | ICD-10-CM

## 2013-08-02 NOTE — Telephone Encounter (Signed)
Pt is needing a new referral to [facility] she had let the original lapse and the original referral request has been to long to use and they need a new one

## 2013-08-07 ENCOUNTER — Other Ambulatory Visit: Payer: Self-pay | Admitting: Family Medicine

## 2013-08-07 DIAGNOSIS — F312 Bipolar disorder, current episode manic severe with psychotic features: Secondary | ICD-10-CM

## 2013-08-07 DIAGNOSIS — R413 Other amnesia: Secondary | ICD-10-CM

## 2013-08-08 ENCOUNTER — Ambulatory Visit (INDEPENDENT_AMBULATORY_CARE_PROVIDER_SITE_OTHER): Payer: 59 | Admitting: Family Medicine

## 2013-08-08 VITALS — BP 100/68 | HR 70 | Temp 98.4°F | Resp 18 | Ht 66.5 in | Wt 229.2 lb

## 2013-08-08 DIAGNOSIS — F312 Bipolar disorder, current episode manic severe with psychotic features: Secondary | ICD-10-CM

## 2013-08-08 DIAGNOSIS — E282 Polycystic ovarian syndrome: Secondary | ICD-10-CM

## 2013-08-08 DIAGNOSIS — F17200 Nicotine dependence, unspecified, uncomplicated: Secondary | ICD-10-CM

## 2013-08-08 DIAGNOSIS — E119 Type 2 diabetes mellitus without complications: Secondary | ICD-10-CM

## 2013-08-08 LAB — CBC WITH DIFFERENTIAL/PLATELET
Basophils Absolute: 0.1 10*3/uL (ref 0.0–0.1)
Basophils Relative: 1 % (ref 0–1)
EOS PCT: 2 % (ref 0–5)
Eosinophils Absolute: 0.2 10*3/uL (ref 0.0–0.7)
HEMATOCRIT: 40.9 % (ref 36.0–46.0)
HEMOGLOBIN: 14.2 g/dL (ref 12.0–15.0)
LYMPHS ABS: 1.7 10*3/uL (ref 0.7–4.0)
LYMPHS PCT: 17 % (ref 12–46)
MCH: 32.6 pg (ref 26.0–34.0)
MCHC: 34.7 g/dL (ref 30.0–36.0)
MCV: 94 fL (ref 78.0–100.0)
MONO ABS: 0.5 10*3/uL (ref 0.1–1.0)
MONOS PCT: 5 % (ref 3–12)
Neutro Abs: 7.7 10*3/uL (ref 1.7–7.7)
Neutrophils Relative %: 75 % (ref 43–77)
Platelets: 305 10*3/uL (ref 150–400)
RBC: 4.35 MIL/uL (ref 3.87–5.11)
RDW: 13.5 % (ref 11.5–15.5)
WBC: 10.2 10*3/uL (ref 4.0–10.5)

## 2013-08-08 LAB — POCT GLYCOSYLATED HEMOGLOBIN (HGB A1C): Hemoglobin A1C: 5.2

## 2013-08-08 MED ORDER — ATORVASTATIN CALCIUM 40 MG PO TABS: 40.0000 mg | ORAL_TABLET | Freq: Every day | ORAL | Status: DC

## 2013-08-08 MED ORDER — SPIRONOLACTONE 50 MG PO TABS: 50.0000 mg | ORAL_TABLET | Freq: Every day | ORAL | Status: DC

## 2013-08-08 NOTE — Progress Notes (Addendum)
Subjective:  This chart was scribed for Delman Cheadle, MD, by Neta Ehlers, ED Scribe. This patient's care was started at 11:31 AM.   Patient ID: Alicia Porter, female    DOB: 04-12-1970, 43 y.o.   MRN: [id number]  Chief Complaint  Patient presents with  . Follow-up    with lab work request    HPI  HPI Comments:  Alicia Porter is a 43 y.o. female with a h/o DM who presents to [facility] for a follow-up visit.  She was recently referred to [facility] for plantar fascitis. At this visit, she reports she takes Mobic intermittently as needed.   At her last office visit with Dr. Ouida Sills, she was started on Relpax for migraines. She reports improvement with the medication stating she has only experienced one migraine since starting Relpax.   Her hemoglobin A1C three months ago was 5.6; her lipid panel, thyroid, and CMP were otherwise normal at that visit. In spite of the A1C of 5.6, her metformin was increased from 500 Qday to 2,000 daily by Dr. Ouida Sills as her CBG was 161. When questioned about the medication change at this visit, Alicia Porter reports she had consumed coffee with sugar prior to the previous visit. She states she has taken 1,000 a day instead of the 2,000 prescribed. She reports intermittent lightheadedness which she resolves with a protein snack. She reports she also intermittently checks her blood sugars at home. The pt has lost 10 pounds since her last visit, and she voices the hope to continue weight loss.    Eight months ago in October at the last visit with this provider, the pt was started a trial of Detrol LA for stress urinary incontinence. At this visit, Alicia Porter reports she has not used Detrol LA as her stress urinary incontinence has resolved.   Alicia Porter reports she continues to use Saphris.   She reports her breathing and cough have improved; she has not needed the Albuterol inhaler.   She also reports she uses a nicotine patch and has had a  decrease of cigarette smoking such that she is smoking only 3-4 cigarettes a day; she is committed to total smoking cessation. She hsa used [facility] cigarette cessation program.   The pt requests ausculation of her carotids. She reports both her mother and grandmother had carotid blockages at a young age. She takes medication for hyperlipidemia.   Past Medical History  Diagnosis Date  . Sleep apnea   . Polycystic ovarian disease   . Polycystic ovarian disease 09/08/2012    takes Aldactone for it  . Depression   . Diabetes mellitus without complication   . Hyperlipidemia    Current Outpatient Prescriptions on File Prior to Visit  Medication Sig Dispense Refill  . albuterol (PROVENTIL HFA;VENTOLIN HFA) 108 (90 BASE) MCG/ACT inhaler Inhale 2 puffs into the lungs every 4 (four) hours as needed for wheezing (cough, shortness of breath or wheezing.).  1 Inhaler  1  . Asenapine Maleate (SAPHRIS) 10 MG SUBL Place 20 mg under the tongue.      Marland Kitchen atorvastatin (LIPITOR) 40 MG tablet Take 1 tablet (40 mg total) by mouth at bedtime.  90 tablet  3  . eletriptan (RELPAX) 40 MG tablet Take 1 tablet (40 mg total) by mouth as needed for migraine or headache. One tablet by mouth at onset of headache. May repeat in 2 hours if headache persists or recurs.  10 tablet  12  . lamoTRIgine (LAMICTAL) 100  MG tablet Take 300 mg by mouth daily.      Marland Kitchen lithium carbonate 300 MG capsule Take 900 mg by mouth at bedtime.      Marland Kitchen LORazepam (ATIVAN) 1 MG tablet Take 1 mg by mouth every 8 (eight) hours.      Marland Kitchen LORazepam (ATIVAN) 2 MG tablet Take 2 mg by mouth at bedtime.      . meloxicam (MOBIC) 15 MG tablet Take 1 tablet (15 mg total) by mouth daily.  30 tablet  5  . metFORMIN (GLUCOPHAGE XR) 500 MG 24 hr tablet Four tabs with evening meal.  (one dose daily)  120 tablet  5  . PRESCRIPTION MEDICATION Saphris 10 mg taking at night      . spironolactone (ALDACTONE) 50 MG tablet Take 1 tablet (50 mg total) by mouth daily after  breakfast.  90 tablet  3  . tolterodine (DETROL LA) 4 MG 24 hr capsule Take 1 capsule (4 mg total) by mouth daily.  90 capsule  1   No current facility-administered medications on file prior to visit.    Allergies  Allergen Reactions  . Levofloxacin Other (See Comments)    Tendon damage  . Sulfa Antibiotics Nausea And Vomiting  . Wellbutrin [Bupropion Hcl] Other (See Comments)    "Crawling out of my skin"  . Saphris [Asenapine]     Cant tolerate it, got very irritable and aggressive on it.    Review of Systems  Constitutional: Negative for fever, chills, activity change, appetite change and fatigue.  Respiratory: Negative for cough and shortness of breath.   Gastrointestinal: Negative for nausea, abdominal pain and diarrhea.  Endocrine: Negative for polydipsia, polyphagia and polyuria.  Genitourinary: Negative for urgency.  Neurological: Positive for light-headedness. Negative for tremors, syncope, weakness and headaches.     Triage Vitals: BP 100/68  Pulse 70  Temp(Src) 98.4 F (36.9 C) (Oral)  Resp 18  Ht 5' 6.5" (1.689 m)  Wt 229 lb 3.2 oz (103.964 kg)  BMI 36.44 kg/m2  SpO2 96%     Objective:   Physical Exam  Nursing note and vitals reviewed. Constitutional: She is oriented to person, place, and time. She appears well-developed and well-nourished. No distress.  HENT:  Head: Normocephalic and atraumatic.  Eyes: Conjunctivae and EOM are normal.  Neck: Neck supple. No tracheal deviation present. No mass and no thyromegaly present.  Cardiovascular: Normal rate, regular rhythm, S1 normal and S2 normal.  Exam reveals no gallop.   No murmur heard. No carotid bruit on left. No carotid bruit on right.   Pulmonary/Chest: Effort normal. No respiratory distress. She has no decreased breath sounds. She has no wheezes. She has no rhonchi. She has no rales.  Lungs clear to ausculation bilaterally.   Musculoskeletal: Normal range of motion.  Neurological: She is alert and  oriented to person, place, and time.  Skin: Skin is warm and dry.  Psychiatric: She has a normal mood and affect. Her behavior is normal.       Assessment & Plan:      lab results will need to be faxed to Dr. Toy Care [phone]   Type II or unspecified type diabetes mellitus without mention of complication, not stated as uncontrolled - Plan: POCT glycosylated hemoglobin (Hb A1C) - a1c cont to be completely normal. However, pt has lost 10 lbs in past 3 mos and feels like her current dose of metformin XR 1000 qhs might be helping with this weightloss so ok to cont at this  dose as she is not having any adverse effects.  Tobacco use disorder - Plan: CBC with Differential, Comprehensive metabolic panel, Lipid panel, TSH - congrats on cutting back w/ patches!!!  PCOS (polycystic ovarian syndrome) - Plan: CBC with Differential, Comprehensive metabolic panel  Bipolar I disorder, most recent episode (or current) manic, severe, specified as with psychotic behavior - Plan: Vit D  25 hydroxy (rtn osteoporosis monitoring), Vitamin B12, Folate, Lithium level  Meds ordered this encounter  Medications  . OLANZapine-FLUoxetine (SYMBYAX) 3-25 MG per capsule    Sig: Take 1 capsule by mouth at bedtime.  Marland Kitchen spironolactone (ALDACTONE) 50 MG tablet    Sig: Take 1 tablet (50 mg total) by mouth daily after breakfast.    Dispense:  90 tablet    Refill:  3  . atorvastatin (LIPITOR) 40 MG tablet    Sig: Take 1 tablet (40 mg total) by mouth at bedtime.    Dispense:  90 tablet    Refill:  3    I personally performed the services described in this documentation, which was scribed in my presence. The recorded information has been reviewed and considered, and addended by me as needed.  Delman Cheadle, MD MPH   Results for orders placed in visit on 08/08/13  CBC WITH DIFFERENTIAL      Result Value Ref Range   WBC 10.2  4.0 - 10.5 K/uL   RBC 4.35  3.87 - 5.11 MIL/uL   Hemoglobin 14.2  12.0 - 15.0 g/dL   HCT 40.9   36.0 - 46.0 %   MCV 94.0  78.0 - 100.0 fL   MCH 32.6  26.0 - 34.0 pg   MCHC 34.7  30.0 - 36.0 g/dL   RDW 13.5  11.5 - 15.5 %   Platelets 305  150 - 400 K/uL   Neutrophils Relative % 75  43 - 77 %   Neutro Abs 7.7  1.7 - 7.7 K/uL   Lymphocytes Relative 17  12 - 46 %   Lymphs Abs 1.7  0.7 - 4.0 K/uL   Monocytes Relative 5  3 - 12 %   Monocytes Absolute 0.5  0.1 - 1.0 K/uL   Eosinophils Relative 2  0 - 5 %   Eosinophils Absolute 0.2  0.0 - 0.7 K/uL   Basophils Relative 1  0 - 1 %   Basophils Absolute 0.1  0.0 - 0.1 K/uL   Smear Review Criteria for review not met    COMPREHENSIVE METABOLIC PANEL      Result Value Ref Range   Sodium 138  135 - 145 mEq/L   Potassium 4.4  3.5 - 5.3 mEq/L   Chloride 106  96 - 112 mEq/L   CO2 22  19 - 32 mEq/L   Glucose, Bld 87  70 - 99 mg/dL   BUN 7  6 - 23 mg/dL   Creat 0.70  0.50 - 1.10 mg/dL   Total Bilirubin 0.4  0.2 - 1.2 mg/dL   Alkaline Phosphatase 85  39 - 117 U/L   AST 9  0 - 37 U/L   ALT 10  0 - 35 U/L   Total Protein 6.6  6.0 - 8.3 g/dL   Albumin 4.4  3.5 - 5.2 g/dL   Calcium 9.2  8.4 - 10.5 mg/dL  LIPID PANEL      Result Value Ref Range   Cholesterol 81  0 - 200 mg/dL   Triglycerides 67  <150 mg/dL   HDL 29 (*) >  39 mg/dL   Total CHOL/HDL Ratio 2.8     VLDL 13  0 - 40 mg/dL   LDL Cholesterol 39  0 - 99 mg/dL  TSH      Result Value Ref Range   TSH 1.363  0.350 - 4.500 uIU/mL  VITAMIN D 25 HYDROXY      Result Value Ref Range   Vit D, 25-Hydroxy 53  30 - 89 ng/mL  VITAMIN B12      Result Value Ref Range   Vitamin B-12 459  211 - 911 pg/mL  FOLATE      Result Value Ref Range   Folate 14.1    LITHIUM LEVEL      Result Value Ref Range   Lithium Lvl 0.40 (*) 0.80 - 1.40 mEq/L  POCT GLYCOSYLATED HEMOGLOBIN (HGB A1C)      Result Value Ref Range   Hemoglobin A1C 5.2

## 2013-08-09 ENCOUNTER — Encounter: Payer: Self-pay | Admitting: Family Medicine

## 2013-08-09 LAB — LIPID PANEL
Cholesterol: 81 mg/dL (ref 0–200)
HDL: 29 mg/dL — AB (ref >39)
LDL Cholesterol: 39 mg/dL (ref 0–99)
Total CHOL/HDL Ratio: 2.8 Ratio
Triglycerides: 67 mg/dL (ref <150)
VLDL: 13 mg/dL (ref 0–40)

## 2013-08-09 LAB — COMPREHENSIVE METABOLIC PANEL
ALBUMIN: 4.4 g/dL (ref 3.5–5.2)
ALT: 10 U/L (ref 0–35)
AST: 9 U/L (ref 0–37)
Alkaline Phosphatase: 85 U/L (ref 39–117)
BUN: 7 mg/dL (ref 6–23)
CALCIUM: 9.2 mg/dL (ref 8.4–10.5)
CHLORIDE: 106 meq/L (ref 96–112)
CO2: 22 meq/L (ref 19–32)
Creat: 0.7 mg/dL (ref 0.50–1.10)
Glucose, Bld: 87 mg/dL (ref 70–99)
Potassium: 4.4 mEq/L (ref 3.5–5.3)
Sodium: 138 mEq/L (ref 135–145)
Total Bilirubin: 0.4 mg/dL (ref 0.2–1.2)
Total Protein: 6.6 g/dL (ref 6.0–8.3)

## 2013-08-09 LAB — LITHIUM LEVEL: Lithium Lvl: 0.4 mEq/L — ABNORMAL LOW (ref 0.80–1.40)

## 2013-08-09 LAB — VITAMIN D 25 HYDROXY (VIT D DEFICIENCY, FRACTURES): VIT D 25 HYDROXY: 53 ng/mL (ref 30–89)

## 2013-08-09 LAB — FOLATE: Folate: 14.1 ng/mL

## 2013-08-09 LAB — VITAMIN B12: VITAMIN B 12: 459 pg/mL (ref 211–911)

## 2013-08-09 LAB — TSH: TSH: 1.363 u[IU]/mL (ref 0.350–4.500)

## 2013-08-10 ENCOUNTER — Encounter: Payer: Self-pay | Admitting: Family Medicine

## 2013-08-15 ENCOUNTER — Ambulatory Visit (INDEPENDENT_AMBULATORY_CARE_PROVIDER_SITE_OTHER): Payer: 59

## 2013-08-15 VITALS — BP 126/71 | HR 81 | Resp 16 | Ht 67.0 in | Wt 230.0 lb

## 2013-08-15 DIAGNOSIS — M79609 Pain in unspecified limb: Secondary | ICD-10-CM

## 2013-08-15 DIAGNOSIS — M722 Plantar fascial fibromatosis: Secondary | ICD-10-CM

## 2013-08-15 DIAGNOSIS — M79606 Pain in leg, unspecified: Secondary | ICD-10-CM

## 2013-08-15 NOTE — Patient Instructions (Signed)
ICE INSTRUCTIONS  Apply ice or cold pack to the affected area at least 3 times a day for 10-15 minutes each time.  You should also use ice after prolonged activity or vigorous exercise.  Do not apply ice longer than 20 minutes at one time.  Always keep a cloth between your skin and the ice pack to prevent burns.  Being consistent and following these instructions will help control your symptoms.  We suggest you purchase a gel ice pack because they are reusable and do bit leak.  Some of them are designed to wrap around the area.  Use the method that works best for you.  Here are some other suggestions for icing.   Use a frozen bag of peas or corn-inexpensive and molds well to your body, usually stays frozen for 10 to 20 minutes.  Wet a towel with cold water and squeeze out the excess until it's damp.  Place in a bag in the freezer for 20 minutes. Then remove and use.   Plantar Fasciitis Plantar fasciitis is a common condition that causes foot pain. It is soreness (inflammation) of the band of tough fibrous tissue on the bottom of the foot that runs from the heel bone (calcaneus) to the ball of the foot. The cause of this soreness may be from excessive standing, poor fitting shoes, running on hard surfaces, being overweight, having an abnormal walk, or overuse (this is common in runners) of the painful foot or feet. It is also common in aerobic exercise dancers and ballet dancers. SYMPTOMS  Most people with plantar fasciitis complain of:  Severe pain in the morning on the bottom of their foot especially when taking the first steps out of bed. This pain recedes after a few minutes of walking.  Severe pain is experienced also during walking following a long period of inactivity.  Pain is worse when walking barefoot or up stairs DIAGNOSIS   Your caregiver will diagnose this condition by examining and feeling your foot.  Special tests such as X-rays of your foot, are usually not needed. PREVENTION     Consult a sports medicine professional before beginning a new exercise program.  Walking programs offer a good workout. With walking there is a lower chance of overuse injuries common to runners. There is less impact and less jarring of the joints.  Begin all new exercise programs slowly. If problems or pain develop, decrease the amount of time or distance until you are at a comfortable level.  Wear good shoes and replace them regularly.  Stretch your foot and the heel cords at the back of the ankle (Achilles tendon) both before and after exercise.  Run or exercise on even surfaces that are not hard. For example, asphalt is better than pavement.  Do not run barefoot on hard surfaces.  If using a treadmill, vary the incline.  Do not continue to workout if you have foot or joint problems. Seek professional help if they do not improve. HOME CARE INSTRUCTIONS   Avoid activities that cause you pain until you recover.  Use ice or cold packs on the problem or painful areas after working out.  Only take over-the-counter or prescription medicines for pain, discomfort, or fever as directed by your caregiver.  Soft shoe inserts or athletic shoes with air or gel sole cushions may be helpful.  If problems continue or become more severe, consult a sports medicine caregiver or your own health care provider. Cortisone is a potent anti-inflammatory medication that may   be injected into the painful area. You can discuss this treatment with your caregiver. MAKE SURE YOU:   Understand these instructions.  Will watch your condition.  Will get help right away if you are not doing well or get worse. Document Released: 10/21/2000 Document Revised: 04/20/2011 Document Reviewed: 12/21/2007 Exit[employer] Patient Information 2015 [employer]. This information is not intended to replace advice given to you by your health care provider. Make sure you discuss any questions you have with your health care  provider.  

## 2013-08-15 NOTE — Progress Notes (Signed)
   Subjective:    Patient ID: Alicia Porter, female    DOB: 10/11/1970, 43 y.o.   MRN: [id number]  HPI Comments: i have heel pain in my rt foot. Ive had it for 2.5 yrs. Its getting worse. Walking will bother it. i had 2 cortisone injections in the past. i do not want anymore injections. i am taking mobic for my foot. i dont do anything else for my foot.  Foot Pain      Review of Systems  All other systems reviewed and are negative.      Objective:   Physical Exam 43 year old white female well-developed well-nourished oriented x3 presents this time with about half a history of inferior right heel it and mid arch pain. Has had steroid injections in the past currently on an NSAID anti-inflammatory taking MOBIC which helped temporarily  Lower extremity objective findings as follows vascular status is intact pedal pulses palpable DP and PT +2/4 capillary fill time 3 seconds all digits skin temperature warm turgor normal no edema rubor pallor or varicosities noted. Neurologically epicritic and proprioceptive sensations intact and symmetric bilateral there is normal plantar response DTRs not elicited to dermatologically skin color pigment and hair growth are normal no open wounds ulcerations no secondary infections. X-rays reveal well-developed inferior calcaneal spur no retrocalcaneal spurring mild thickening of plantar fascia is noted as well quickly there is pain on palpation mid arch and plantar fascia from mid arch following to the inferior heel area pain on dorsiflexion and direct palpation. Should the pain on first up in the morning or getting off a period of rest. Patient does wear flip-flops around the house or sometimes goes barefoot is wearing a pair of ASICS athletic shoes and does have Birkenstock's as well.       Assessment & Plan:  Assessment plantar fasciitis/heel spur syndrome right foot this time patient was to avoid steroid injections name patient is to maintain her MOBIC  anti-inflammatory she is currently taking fascial strapping applied to the right foot reappointed in one to 3 weeks for followup and reassessment if the strapping is beneficial may be candidate for functional orthoses if no improvement consider a steroid injection or other noninvasive and invasive options. Followup in one to 3 weeks also recommended crocs for around the house no barefoot no flimsy shoes or flip-flops at  [employer] Kyren Knick DPM

## 2013-08-16 DIAGNOSIS — F312 Bipolar disorder, current episode manic severe with psychotic features: Secondary | ICD-10-CM

## 2013-08-16 DIAGNOSIS — R413 Other amnesia: Secondary | ICD-10-CM

## 2013-08-22 ENCOUNTER — Ambulatory Visit: Payer: 59 | Admitting: Podiatry

## 2013-09-05 ENCOUNTER — Ambulatory Visit: Payer: 59

## 2013-10-03 ENCOUNTER — Ambulatory Visit: Payer: Self-pay | Admitting: Neurology

## 2013-10-04 ENCOUNTER — Encounter (HOSPITAL_COMMUNITY): Payer: Self-pay | Admitting: Emergency Medicine

## 2013-10-04 ENCOUNTER — Emergency Department (HOSPITAL_COMMUNITY)
Admission: EM | Admit: 2013-10-04 | Discharge: 2013-10-05 | Disposition: A | Discharge status: Home / Self Care | Payer: 59 | Attending: Emergency Medicine | Admitting: Emergency Medicine

## 2013-10-04 DIAGNOSIS — R45851 Suicidal ideations: Secondary | ICD-10-CM | POA: Insufficient documentation

## 2013-10-04 DIAGNOSIS — F329 Major depressive disorder, single episode, unspecified: Secondary | ICD-10-CM | POA: Insufficient documentation

## 2013-10-04 DIAGNOSIS — E282 Polycystic ovarian syndrome: Secondary | ICD-10-CM | POA: Insufficient documentation

## 2013-10-04 DIAGNOSIS — Z3202 Encounter for pregnancy test, result negative: Secondary | ICD-10-CM | POA: Insufficient documentation

## 2013-10-04 DIAGNOSIS — Z79899 Other long term (current) drug therapy: Secondary | ICD-10-CM | POA: Insufficient documentation

## 2013-10-04 DIAGNOSIS — Z791 Long term (current) use of non-steroidal anti-inflammatories (NSAID): Secondary | ICD-10-CM | POA: Diagnosis not present

## 2013-10-04 DIAGNOSIS — F1010 Alcohol abuse, uncomplicated: Secondary | ICD-10-CM | POA: Diagnosis not present

## 2013-10-04 DIAGNOSIS — E119 Type 2 diabetes mellitus without complications: Secondary | ICD-10-CM | POA: Diagnosis not present

## 2013-10-04 DIAGNOSIS — F17200 Nicotine dependence, unspecified, uncomplicated: Secondary | ICD-10-CM | POA: Diagnosis not present

## 2013-10-04 DIAGNOSIS — F3289 Other specified depressive episodes: Secondary | ICD-10-CM | POA: Diagnosis not present

## 2013-10-04 DIAGNOSIS — E785 Hyperlipidemia, unspecified: Secondary | ICD-10-CM | POA: Diagnosis not present

## 2013-10-04 DIAGNOSIS — F1020 Alcohol dependence, uncomplicated: Secondary | ICD-10-CM

## 2013-10-04 LAB — COMPREHENSIVE METABOLIC PANEL
ALBUMIN: 4.1 g/dL (ref 3.5–5.2)
ALK PHOS: 87 U/L (ref 39–117)
ALT: 22 U/L (ref 0–35)
AST: 17 U/L (ref 0–37)
Anion gap: 17 — ABNORMAL HIGH (ref 5–15)
BILIRUBIN TOTAL: 0.2 mg/dL — AB (ref 0.3–1.2)
BUN: 11 mg/dL (ref 6–23)
CO2: 19 meq/L (ref 19–32)
Calcium: 9.5 mg/dL (ref 8.4–10.5)
Chloride: 103 mEq/L (ref 96–112)
Creatinine, Ser: 0.81 mg/dL (ref 0.50–1.10)
GFR calc Af Amer: >90 mL/min (ref >90)
GFR, EST NON AFRICAN AMERICAN: 87 mL/min — AB (ref >90)
Glucose, Bld: 113 mg/dL — ABNORMAL HIGH (ref 70–99)
POTASSIUM: 3.9 meq/L (ref 3.7–5.3)
Sodium: 139 mEq/L (ref 137–147)
Total Protein: 7.2 g/dL (ref 6.0–8.3)

## 2013-10-04 LAB — CBC
HCT: 39.5 % (ref 36.0–46.0)
Hemoglobin: 13.3 g/dL (ref 12.0–15.0)
MCH: 32.3 pg (ref 26.0–34.0)
MCHC: 33.7 g/dL (ref 30.0–36.0)
MCV: 95.9 fL (ref 78.0–100.0)
PLATELETS: 297 10*3/uL (ref 150–400)
RBC: 4.12 MIL/uL (ref 3.87–5.11)
RDW: 13.7 % (ref 11.5–15.5)
WBC: 10.7 10*3/uL — ABNORMAL HIGH (ref 4.0–10.5)

## 2013-10-04 LAB — RAPID URINE DRUG SCREEN, HOSP PERFORMED
AMPHETAMINES: NOT DETECTED
BARBITURATES: NOT DETECTED
BENZODIAZEPINES: NOT DETECTED
COCAINE: NOT DETECTED
Opiates: NOT DETECTED
Tetrahydrocannabinol: NOT DETECTED

## 2013-10-04 LAB — PREGNANCY, URINE: Preg Test, Ur: NEGATIVE

## 2013-10-04 LAB — ACETAMINOPHEN LEVEL

## 2013-10-04 LAB — SALICYLATE LEVEL: Salicylate Lvl: <2 mg/dL — ABNORMAL LOW (ref 2.8–20.0)

## 2013-10-04 LAB — ETHANOL: Alcohol, Ethyl (B): 126 mg/dL — ABNORMAL HIGH (ref 0–11)

## 2013-10-04 MED ORDER — ACETAMINOPHEN 325 MG PO TABS
650.0000 mg | ORAL_TABLET | ORAL | Status: DC | PRN
  Administered 2013-10-05: 650 mg via ORAL
  Filled 2013-10-04: qty 2

## 2013-10-04 MED ORDER — OLANZAPINE-FLUOXETINE HCL 6-25 MG PO CAPS
1.0000 | ORAL_CAPSULE | Freq: Every day | ORAL | Status: DC
  Administered 2013-10-05: 1 via ORAL
  Filled 2013-10-04 (×2): qty 1

## 2013-10-04 MED ORDER — ELETRIPTAN HYDROBROMIDE 40 MG PO TABS
40.0000 mg | ORAL_TABLET | ORAL | Status: DC | PRN
  Filled 2013-10-04: qty 1

## 2013-10-04 MED ORDER — SPIRONOLACTONE 50 MG PO TABS
50.0000 mg | ORAL_TABLET | Freq: Every day | ORAL | Status: DC
  Administered 2013-10-05: 50 mg via ORAL
  Filled 2013-10-04 (×2): qty 1

## 2013-10-04 MED ORDER — ALUM & MAG HYDROXIDE-SIMETH 200-200-20 MG/5ML PO SUSP: 30.0000 mL | ORAL | Status: DC | PRN

## 2013-10-04 MED ORDER — ONDANSETRON HCL 4 MG PO TABS: 4.0000 mg | ORAL_TABLET | Freq: Three times a day (TID) | ORAL | Status: DC | PRN

## 2013-10-04 MED ORDER — LORAZEPAM 1 MG PO TABS: 0.0000 mg | ORAL_TABLET | Freq: Two times a day (BID) | ORAL | Status: DC

## 2013-10-04 MED ORDER — LITHIUM CARBONATE 300 MG PO CAPS
450.0000 mg | ORAL_CAPSULE | Freq: Two times a day (BID) | ORAL | Status: DC
  Administered 2013-10-05: 450 mg via ORAL
  Filled 2013-10-04 (×2): qty 1

## 2013-10-04 MED ORDER — LORAZEPAM 1 MG PO TABS
0.0000 mg | ORAL_TABLET | Freq: Four times a day (QID) | ORAL | Status: DC
  Administered 2013-10-05: 1 mg via ORAL
  Filled 2013-10-04: qty 1

## 2013-10-04 MED ORDER — NICOTINE 21 MG/24HR TD PT24
21.0000 mg | MEDICATED_PATCH | Freq: Every day | TRANSDERMAL | Status: DC
Start: 2013-10-04 — End: 2013-10-05
  Administered 2013-10-05: 21 mg via TRANSDERMAL
  Filled 2013-10-04: qty 1

## 2013-10-04 MED ORDER — MELOXICAM 15 MG PO TABS
15.0000 mg | ORAL_TABLET | Freq: Every day | ORAL | Status: DC
  Filled 2013-10-04: qty 1

## 2013-10-04 MED ORDER — IBUPROFEN 200 MG PO TABS: 600.0000 mg | ORAL_TABLET | Freq: Three times a day (TID) | ORAL | Status: DC | PRN

## 2013-10-04 MED ORDER — VITAMIN B-1 100 MG PO TABS
100.0000 mg | ORAL_TABLET | Freq: Every day | ORAL | Status: DC
  Administered 2013-10-05: 100 mg via ORAL
  Filled 2013-10-04: qty 1

## 2013-10-04 MED ORDER — THIAMINE HCL 100 MG/ML IJ SOLN: 100.0000 mg | Freq: Every day | INTRAMUSCULAR | Status: DC

## 2013-10-04 MED ORDER — METFORMIN HCL ER 500 MG PO TB24
1000.0000 mg | ORAL_TABLET | Freq: Every evening | ORAL | Status: DC
  Administered 2013-10-05: 1000 mg via ORAL
  Filled 2013-10-04 (×2): qty 2

## 2013-10-04 MED ORDER — ATORVASTATIN CALCIUM 40 MG PO TABS
40.0000 mg | ORAL_TABLET | Freq: Every day | ORAL | Status: DC
  Administered 2013-10-05: 40 mg via ORAL
  Filled 2013-10-04 (×2): qty 1

## 2013-10-04 MED ORDER — LAMOTRIGINE 200 MG PO TABS
200.0000 mg | ORAL_TABLET | Freq: Every day | ORAL | Status: DC
  Filled 2013-10-04: qty 1

## 2013-10-04 NOTE — ED Notes (Signed)
Pt requesting detox from [facility].  PT states that she drinks 3-5 24 oz per day.  States that she had a recent in change in bipolar meds that made her "manic as hell".  Pt diaphoretic but just came in from outside where it is hot.  Denies SI/HI.

## 2013-10-04 NOTE — ED Notes (Signed)
Patient seen walking outside. RN notified.

## 2013-10-04 NOTE — ED Provider Notes (Signed)
TIME SEEN: 9:30 PM  CHIEF COMPLAINT: Alcohol detox  HPI: Patient is a 43 y.o. F with history of polycystic ovary disease, depression, hyperlipidemia, diabetes, reported history of bipolar disorder who presents emergency department requesting alcohol detox. She states that for the past month she has been drinking approximately three to six 24 ounce beers a day. She states that prior to this she was sober since January 2014. Denies any illicit drug use. Denies SI but states "if I had a gun, I wouldn't be here". She states she does not have access to firearms. No HI. No hallucinations. No current medical complaints. Denies a history of withdrawal seizure.  ROS: See HPI Constitutional: no fever  Eyes: no drainage  ENT: no runny nose   Cardiovascular:  no chest pain  Resp: no SOB  GI: no vomiting GU: no dysuria Integumentary: no rash  Allergy: no hives  Musculoskeletal: no leg swelling  Neurological: no slurred speech ROS otherwise negative  PAST MEDICAL HISTORY/PAST SURGICAL HISTORY:  Past Medical History  Diagnosis Date  . Sleep apnea   . Polycystic ovarian disease   . Polycystic ovarian disease 09/08/2012    takes Aldactone for it  . Depression   . Diabetes mellitus without complication   . Hyperlipidemia     MEDICATIONS:  Prior to Admission medications   Medication Sig Start Date End Date Taking? Authorizing Provider  atorvastatin (LIPITOR) 40 MG tablet Take 1 tablet (40 mg total) by mouth at bedtime. 08/08/13  Yes Sherren Mocha, MD  eletriptan (RELPAX) 40 MG tablet Take 1 tablet (40 mg total) by mouth as needed for migraine or headache. One tablet by mouth at onset of headache. May repeat in 2 hours if headache persists or recurs. 04/10/13  Yes Carmelina Dane, MD  lamoTRIgine (LAMICTAL) 100 MG tablet Take 200 mg by mouth daily.    Yes Historical Provider, MD  lithium carbonate 300 MG capsule Take 450 mg by mouth 2 (two) times daily with a meal.    Yes Historical Provider, MD   LORazepam (ATIVAN) 1 MG tablet Take 2 mg by mouth 4 (four) times daily.    Yes Historical Provider, MD  meloxicam (MOBIC) 15 MG tablet Take 1 tablet (15 mg total) by mouth daily. 04/10/13  Yes Carmelina Dane, MD  metFORMIN (GLUCOPHAGE-XR) 500 MG 24 hr tablet Take 1,000 mg by mouth every evening.   Yes Historical Provider, MD  OLANZapine-FLUoxetine (SYMBYAX) 6-25 MG per capsule Take 1 capsule by mouth at bedtime.   Yes Historical Provider, MD  spironolactone (ALDACTONE) 50 MG tablet Take 1 tablet (50 mg total) by mouth daily after breakfast. 08/08/13  Yes Sherren Mocha, MD    ALLERGIES:  Allergies  Allergen Reactions  . Levofloxacin Other (See Comments)    Tendon damage  . Sulfa Antibiotics Nausea And Vomiting  . Wellbutrin [Bupropion Hcl] Other (See Comments)    "Crawling out of my skin"    SOCIAL HISTORY:  History  Substance Use Topics  . Smoking status: Current Every Day Smoker -- 1.00 packs/day  . Smokeless tobacco: Not on file  . Alcohol Use: No     Comment: history of alcohol use esp when manic none recently    FAMILY HISTORY: Family History  Problem Relation Age of Onset  . Heart disease Mother   . Diabetes Mother   . Hyperlipidemia Mother     EXAM: BP 114/73  Pulse 85  Temp(Src) 98.1 F (36.7 C) (Oral)  Resp 20  SpO2  96% CONSTITUTIONAL: Alert and oriented and responds appropriately to questions. Well-appearing; well-nourished HEAD: Normocephalic EYES: Conjunctivae clear, PERRL ENT: normal nose; no rhinorrhea; moist mucous membranes; pharynx without lesions noted NECK: Supple, no meningismus, no LAD  CARD: RRR; S1 and S2 appreciated; no murmurs, no clicks, no rubs, no gallops RESP: Normal chest excursion without splinting or tachypnea; breath sounds clear and equal bilaterally; no wheezes, no rhonchi, no rales,  ABD/GI: Normal bowel sounds; non-distended; soft, non-tender, no rebound, no guarding BACK:  The back appears normal and is non-tender to palpation,  there is no CVA tenderness EXT: Normal ROM in all joints; non-tender to palpation; no edema; normal capillary refill; no cyanosis    SKIN: Normal color for age and race; warm NEURO: Moves all extremities equally PSYCH: Patient is tearful. Denies active SI with plan or HI but does report some passive suicidality. Grooming and personal hygiene are appropriate.  MEDICAL DECISION MAKING: Patient here voluntarily requesting detox with passive suicidal ideation. No acute medical complaints. Her screening labs and urine are unremarkable. We will discuss with TTS for evaluation for inpatient detox.      Layla Maw Lisett Dirusso, DO 10/04/13 [id number]

## 2013-10-04 NOTE — ED Notes (Signed)
Patient came back in, patient was informed she may not walk in and out of treatment area that she will need to stay in her treatment area.

## 2013-10-05 ENCOUNTER — Telehealth: Payer: Self-pay | Admitting: Neurology

## 2013-10-05 ENCOUNTER — Encounter: Payer: Self-pay | Admitting: *Deleted

## 2013-10-05 LAB — LITHIUM LEVEL

## 2013-10-05 NOTE — ED Notes (Signed)
Belongings bag x1 with jeans, belt, shirt, bra, underwear,purse with wallet, meds and 1 red suitcase

## 2013-10-05 NOTE — BH Assessment (Signed)
Requested TA equipment be set up.   TA to commence shortly.    Clista Bernhardt, [facility] Triage Specialist 10/05/2013 2:55 AM

## 2013-10-05 NOTE — BH Assessment (Signed)
Reviewed provider notes prior to assessment as Dr. Elesa Massed has left for the evening.   Requested TA equipment be set up. Nurse reports pt is asleep on CPAP machine and he is in another room with doctor. He will attempt to wake her and see if she can be assessed in about 30 minutes.   Will attempt assessment after completing next assessment in line.     Clista Bernhardt, [facility] Triage Specialist 10/05/2013 1:54 AM

## 2013-10-05 NOTE — [facility] Suicide Risk Assessment (Signed)
Suicide Risk Assessment  Discharge Assessment     Demographic Factors:  Caucasian  Total Time spent with patient: 30 minutes  Psychiatric Specialty Exam:     Blood pressure 121/68, pulse 92, temperature 98.1 F (36.7 C), temperature source Oral, resp. rate 20, SpO2 100.00%.There is no weight on file to calculate BMI.  General Appearance: Fairly Groomed  Patent attorney::  Good  Speech:  Clear and Coherent  Volume:  Normal  Mood:  Euthymic  Affect:  Appropriate  Thought Process:  Coherent and Logical  Orientation:  Full (Time, Place, and Person)  Thought Content:  Negative  Suicidal Thoughts:  No  Homicidal Thoughts:  No  Memory:  Immediate;   Good Recent;   Good Remote;   Good  Judgement:  Good  Insight:  Good  Psychomotor Activity:  Normal  Concentration:  Good  Recall:  Good  Fund of Knowledge:Good  Language: Good  Akathisia:  Negative  Handed:  Right  AIMS (if indicated):     Assets:  Communication Skills Desire for Improvement Financial Resources/Insurance Housing Intimacy Leisure Time Physical Health Resilience Social Support Talents/Skills Transportation Vocational/Educational  Sleep:       Musculoskeletal: Strength & Muscle Tone: within normal limits Gait & Station: normal Patient leans: N/A   Mental Status Per Nursing Assessment::   On Admission:     Current Mental Status by Physician: NA  Loss Factors: NA  Historical Factors: NA  Risk Reduction Factors:   NA  Continued Clinical Symptoms:  Alcohol/Substance Abuse/Dependencies  Cognitive Features That Contribute To Risk:  none   Suicide Risk:  Minimal: No identifiable suicidal ideation.  Patients presenting with no risk factors but with morbid ruminations; may be classified as minimal risk based on the severity of the depressive symptoms  Discharge Diagnoses:   AXIS I:  alcohol dependence AXIS II:  Deferred AXIS III:   Past Medical History  Diagnosis Date  . Sleep apnea   .  Polycystic ovarian disease   . Polycystic ovarian disease 09/08/2012    takes Aldactone for it  . Depression   . Diabetes mellitus without complication   . Hyperlipidemia    AXIS IV:  chronic addiction AXIS V:  51-60 moderate symptoms  Plan Of Care/Follow-up recommendations:  Activity:  resume usual activity Diet:  resume usual diet  Is patient on multiple antipsychotic therapies at discharge:  No   Has Patient had three or more failed trials of antipsychotic monotherapy by history:  No  Recommended Plan for Multiple Antipsychotic Therapies: NA    Juanmanuel Marohl D 10/05/2013, 10:41 AM

## 2013-10-05 NOTE — Telephone Encounter (Signed)
Pt no showed 10/03/13 appt w/ Dr. Karel Jarvis. Marlane Hatcher to mail no show letter to pt and [place]ze to alert referring office of no show / Sherri
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
## 2013-10-05 NOTE — ED Notes (Signed)
Pt seen walking out of the ED with her belongings

## 2013-10-05 NOTE — Progress Notes (Signed)
RT placed pt on CPAP at 14cmH2O per home settings via full face mask. Sterile water was added for humidification. Pt looks comfortable and is tolerating CPAP well at this time. Pt was encouraged to contact RT for assistance if needed. RT will continue to monitor as needed.

## 2013-10-05 NOTE — Progress Notes (Addendum)
Under Review:  Writer informed the nurse that the patient has been referred to 14 hospitals and was declined at 21 hospitals because they were at capacity.  UNDER REVIEW: Alicia Porter, [place], [place], [place], [place], [place], [place], [facility], [place], [facility]     -AT CAPACITY  [facility], per [facility], per [facility], per [facility], per [facility], per [facility], per [place]  [facility], per [facility], per [facility], per [facility], per [facility], per [facility], per [place]

## 2013-10-05 NOTE — ED Notes (Signed)
Patient states that she takes naps during the day and uses her cpap. Charge nurse made aware

## 2013-10-05 NOTE — Progress Notes (Signed)
No show letter sent for 10/03/2013 
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
## 2013-10-05 NOTE — ED Notes (Signed)
A staff member saw patient get her belongs from the nurse's station and when questioned, the patient stated she was going to be discharged. ACT team brought referrals to the desk and when writer went into the room the patient and belongings were gone. Charge nurse notified.

## 2013-12-15 ENCOUNTER — Other Ambulatory Visit: Payer: Self-pay | Admitting: Emergency Medicine

## 2014-01-27 ENCOUNTER — Other Ambulatory Visit: Payer: Self-pay | Admitting: Physician Assistant

## 2014-03-13 ENCOUNTER — Other Ambulatory Visit: Payer: Self-pay | Admitting: Family Medicine

## 2014-04-13 DIAGNOSIS — Z9989 Dependence on other enabling machines and devices: Secondary | ICD-10-CM | POA: Insufficient documentation

## 2014-04-13 DIAGNOSIS — F458 Other somatoform disorders: Secondary | ICD-10-CM | POA: Insufficient documentation

## 2014-04-13 DIAGNOSIS — G4700 Insomnia, unspecified: Secondary | ICD-10-CM | POA: Insufficient documentation

## 2014-04-13 DIAGNOSIS — G4733 Obstructive sleep apnea (adult) (pediatric): Secondary | ICD-10-CM | POA: Insufficient documentation

## 2014-04-13 DIAGNOSIS — G2581 Restless legs syndrome: Secondary | ICD-10-CM | POA: Insufficient documentation

## 2014-04-13 DIAGNOSIS — G4750 Parasomnia, unspecified: Secondary | ICD-10-CM | POA: Insufficient documentation

## 2014-08-20 ENCOUNTER — Other Ambulatory Visit: Payer: Self-pay | Admitting: Family Medicine

## 2014-09-27 ENCOUNTER — Encounter: Payer: Self-pay | Admitting: *Deleted

## 2014-09-27 DIAGNOSIS — G4733 Obstructive sleep apnea (adult) (pediatric): Secondary | ICD-10-CM

## 2014-09-27 DIAGNOSIS — Z9989 Dependence on other enabling machines and devices: Secondary | ICD-10-CM

## 2014-09-27 DIAGNOSIS — G2581 Restless legs syndrome: Secondary | ICD-10-CM

## 2014-09-27 DIAGNOSIS — G4750 Parasomnia, unspecified: Secondary | ICD-10-CM

## 2014-09-27 DIAGNOSIS — G4700 Insomnia, unspecified: Secondary | ICD-10-CM

## 2014-09-27 DIAGNOSIS — F458 Other somatoform disorders: Secondary | ICD-10-CM

## 2014-11-12 DIAGNOSIS — E119 Type 2 diabetes mellitus without complications: Secondary | ICD-10-CM | POA: Insufficient documentation

## 2014-11-12 DIAGNOSIS — F319 Bipolar disorder, unspecified: Secondary | ICD-10-CM | POA: Insufficient documentation

## 2014-11-12 DIAGNOSIS — F329 Major depressive disorder, single episode, unspecified: Secondary | ICD-10-CM | POA: Insufficient documentation

## 2014-11-12 DIAGNOSIS — F1910 Other psychoactive substance abuse, uncomplicated: Secondary | ICD-10-CM | POA: Insufficient documentation

## 2014-11-12 DIAGNOSIS — F32A Depression, unspecified: Secondary | ICD-10-CM | POA: Insufficient documentation

## 2015-08-25 IMAGING — CR DG CHEST 2V
2 series · 2 of 2 positions shown · non-contrast
Comparison: None.

CLINICAL DATA: Cough and chest tightness.

EXAM:
CHEST  2 VIEW

[PA]
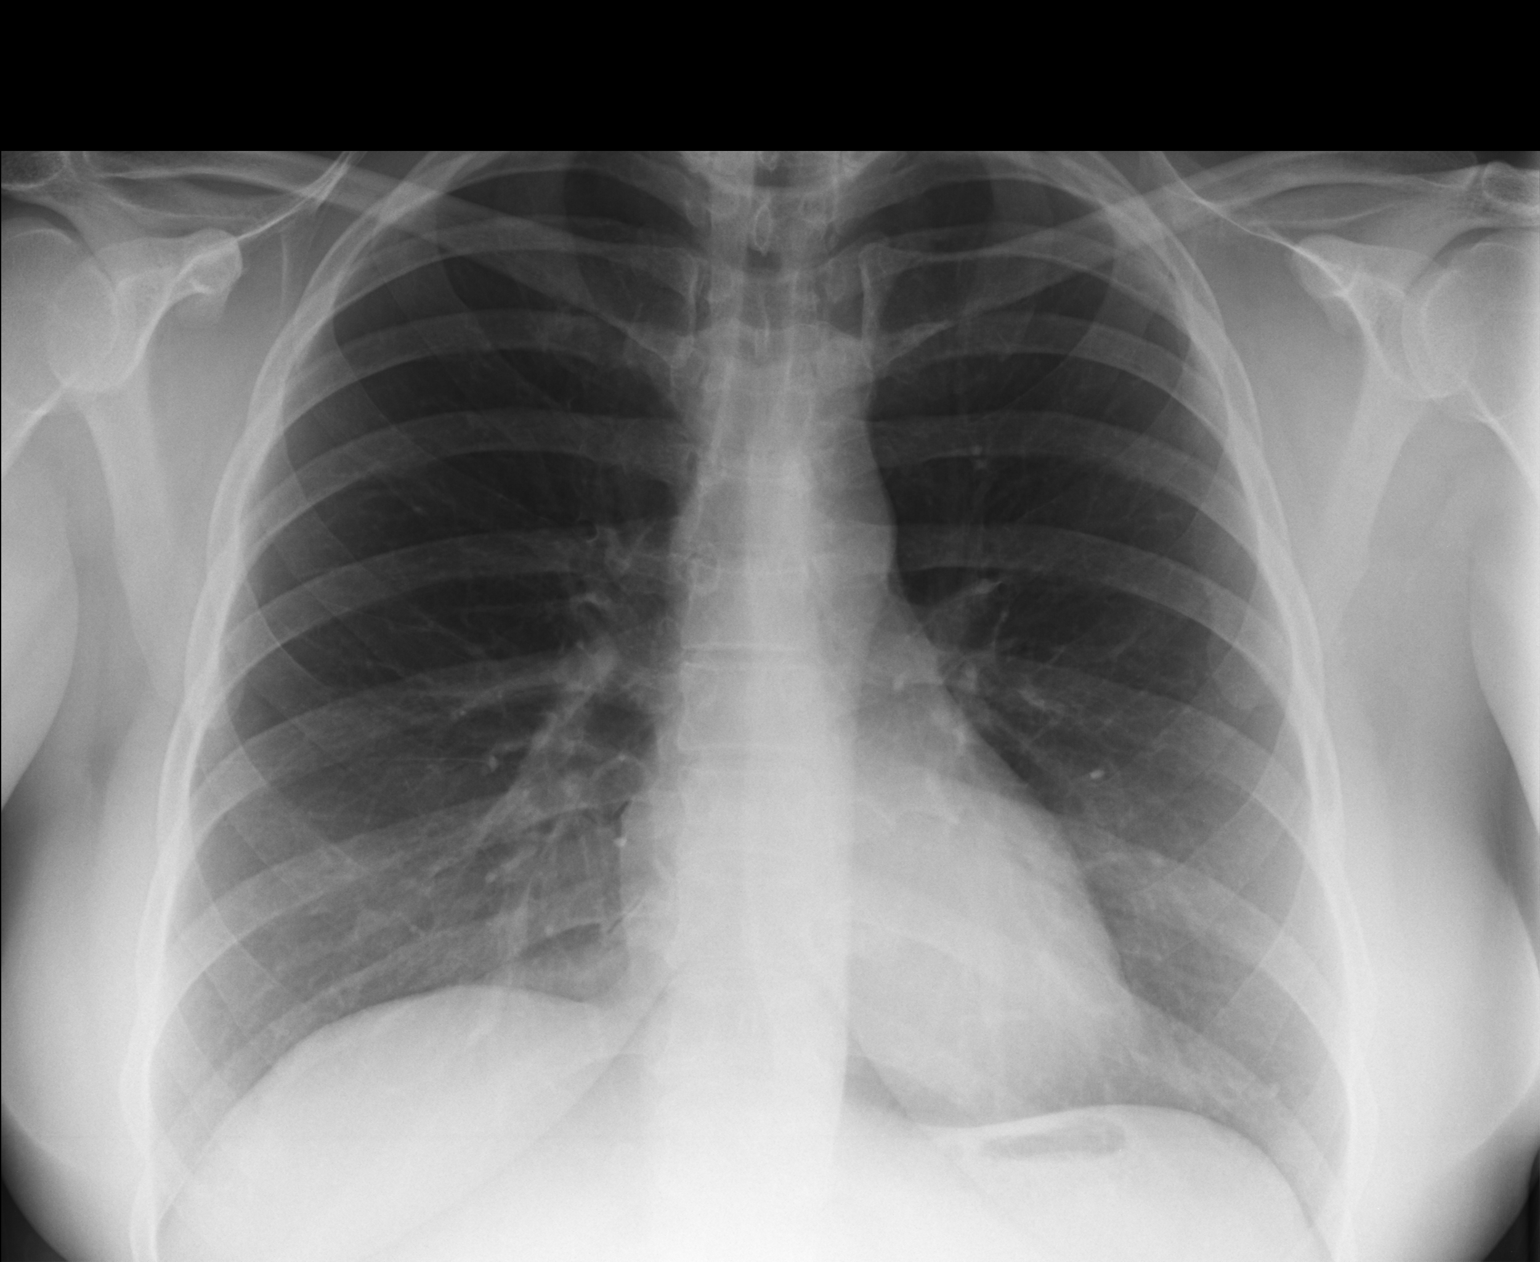

[lateral]
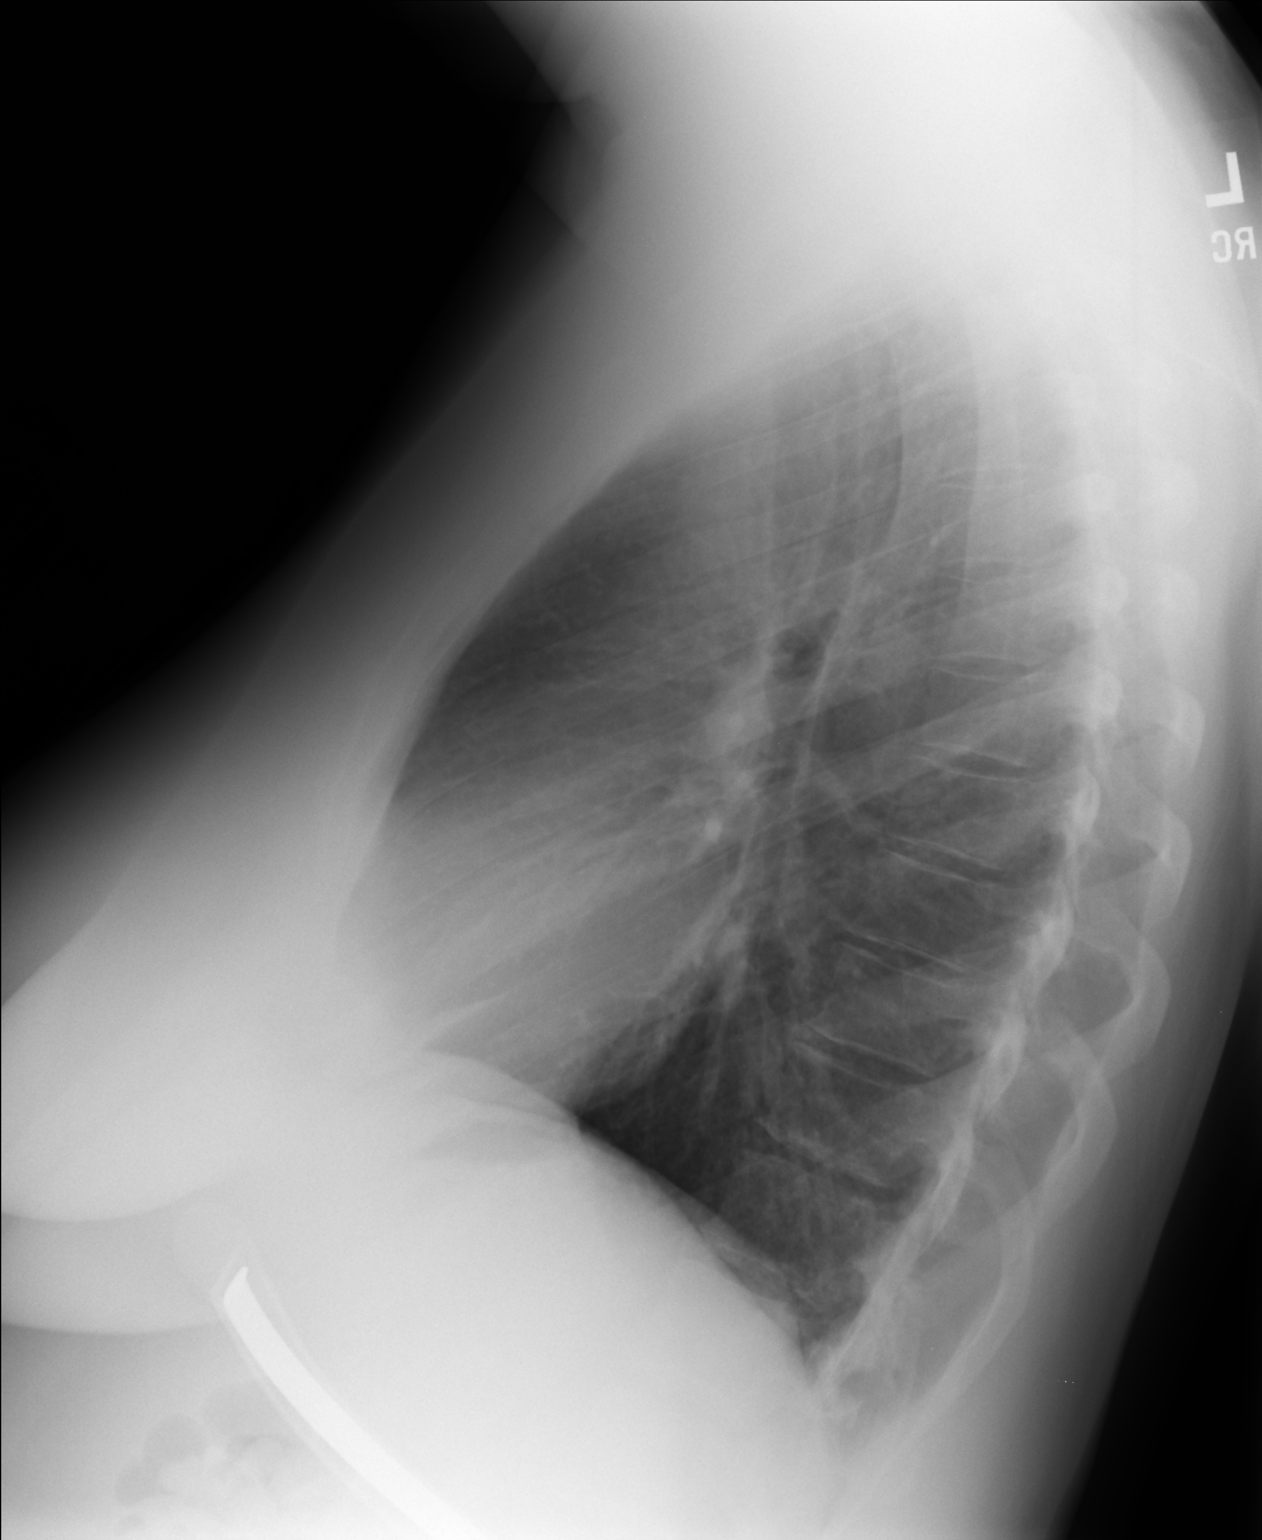

[2 of 2 positions shown; findings below may reference images not displayed]

FINDINGS: The heart size and mediastinal contours are within normal limits.
Both lungs are clear. The visualized skeletal structures are
unremarkable.
IMPRESSION: No active cardiopulmonary disease.

## 2015-10-08 ENCOUNTER — Telehealth: Payer: Self-pay

## 2015-10-08 NOTE — Telephone Encounter (Signed)
Patient called to pay her balance over the phone. I called patient back to let her know that I wrote the card number down wrong. Patient did not answer

## 2016-10-08 ENCOUNTER — Emergency Department (HOSPITAL_COMMUNITY)
Admission: EM | Admit: 2016-10-08 | Discharge: 2016-10-09 | Disposition: A | Discharge status: Other Acute Inpatient Hospital | Payer: BLUE CROSS/BLUE SHIELD | Attending: Emergency Medicine | Admitting: Emergency Medicine

## 2016-10-08 ENCOUNTER — Encounter (HOSPITAL_COMMUNITY): Payer: Self-pay | Admitting: Emergency Medicine

## 2016-10-08 DIAGNOSIS — Z79899 Other long term (current) drug therapy: Secondary | ICD-10-CM | POA: Insufficient documentation

## 2016-10-08 DIAGNOSIS — F10230 Alcohol dependence with withdrawal, uncomplicated: Secondary | ICD-10-CM | POA: Diagnosis not present

## 2016-10-08 DIAGNOSIS — F419 Anxiety disorder, unspecified: Secondary | ICD-10-CM | POA: Diagnosis not present

## 2016-10-08 DIAGNOSIS — F17200 Nicotine dependence, unspecified, uncomplicated: Secondary | ICD-10-CM | POA: Insufficient documentation

## 2016-10-08 DIAGNOSIS — Z720 Tobacco use: Secondary | ICD-10-CM

## 2016-10-08 DIAGNOSIS — F22 Delusional disorders: Secondary | ICD-10-CM

## 2016-10-08 DIAGNOSIS — F333 Major depressive disorder, recurrent, severe with psychotic symptoms: Secondary | ICD-10-CM

## 2016-10-08 DIAGNOSIS — F329 Major depressive disorder, single episode, unspecified: Secondary | ICD-10-CM | POA: Diagnosis present

## 2016-10-08 DIAGNOSIS — E119 Type 2 diabetes mellitus without complications: Secondary | ICD-10-CM | POA: Diagnosis not present

## 2016-10-08 DIAGNOSIS — R45851 Suicidal ideations: Secondary | ICD-10-CM

## 2016-10-08 DIAGNOSIS — Z7984 Long term (current) use of oral hypoglycemic drugs: Secondary | ICD-10-CM | POA: Insufficient documentation

## 2016-10-08 DIAGNOSIS — Z008 Encounter for other general examination: Secondary | ICD-10-CM

## 2016-10-08 DIAGNOSIS — Z87898 Personal history of other specified conditions: Secondary | ICD-10-CM

## 2016-10-08 LAB — I-STAT BETA HCG BLOOD, ED (MC, WL, AP ONLY)

## 2016-10-08 LAB — COMPREHENSIVE METABOLIC PANEL
ALT: 21 U/L (ref 14–54)
ANION GAP: 9 (ref 5–15)
AST: 19 U/L (ref 15–41)
Albumin: 4.6 g/dL (ref 3.5–5.0)
Alkaline Phosphatase: 68 U/L (ref 38–126)
BUN: 6 mg/dL (ref 6–20)
CALCIUM: 9.8 mg/dL (ref 8.9–10.3)
CO2: 25 mmol/L (ref 22–32)
Chloride: 105 mmol/L (ref 101–111)
Creatinine, Ser: 0.76 mg/dL (ref 0.44–1.00)
GFR calc non Af Amer: >60 mL/min (ref >60)
Glucose, Bld: 116 mg/dL — ABNORMAL HIGH (ref 65–99)
Potassium: 4 mmol/L (ref 3.5–5.1)
Sodium: 139 mmol/L (ref 135–145)
Total Bilirubin: 0.6 mg/dL (ref 0.3–1.2)
Total Protein: 7.5 g/dL (ref 6.5–8.1)

## 2016-10-08 LAB — SALICYLATE LEVEL: Salicylate Lvl: <7 mg/dL (ref 2.8–30.0)

## 2016-10-08 LAB — RAPID URINE DRUG SCREEN, HOSP PERFORMED
Amphetamines: NOT DETECTED
Barbiturates: NOT DETECTED
Benzodiazepines: NOT DETECTED
COCAINE: NOT DETECTED
Opiates: NOT DETECTED
Tetrahydrocannabinol: NOT DETECTED

## 2016-10-08 LAB — CBC
HCT: 44.9 % (ref 36.0–46.0)
Hemoglobin: 15.9 g/dL — ABNORMAL HIGH (ref 12.0–15.0)
MCH: 34.3 pg — ABNORMAL HIGH (ref 26.0–34.0)
MCHC: 35.4 g/dL (ref 30.0–36.0)
MCV: 96.8 fL (ref 78.0–100.0)
Platelets: 276 10*3/uL (ref 150–400)
RBC: 4.64 MIL/uL (ref 3.87–5.11)
RDW: 13.5 % (ref 11.5–15.5)
WBC: 12.5 10*3/uL — ABNORMAL HIGH (ref 4.0–10.5)

## 2016-10-08 LAB — ACETAMINOPHEN LEVEL

## 2016-10-08 LAB — ETHANOL: Alcohol, Ethyl (B): <5 mg/dL (ref <5)

## 2016-10-08 MED ORDER — ONDANSETRON HCL 4 MG PO TABS: 4.0000 mg | ORAL_TABLET | Freq: Three times a day (TID) | ORAL | Status: DC | PRN

## 2016-10-08 MED ORDER — ACETAMINOPHEN 325 MG PO TABS: 650.0000 mg | ORAL_TABLET | ORAL | Status: DC | PRN

## 2016-10-08 MED ORDER — HYDROXYZINE HCL 25 MG PO TABS
25.0000 mg | ORAL_TABLET | Freq: Three times a day (TID) | ORAL | Status: DC | PRN
  Administered 2016-10-09: 25 mg via ORAL
  Filled 2016-10-08: qty 1

## 2016-10-08 MED ORDER — NICOTINE 21 MG/24HR TD PT24
21.0000 mg | MEDICATED_PATCH | Freq: Every day | TRANSDERMAL | Status: DC
  Administered 2016-10-08 – 2016-10-09 (×2): 21 mg via TRANSDERMAL
  Filled 2016-10-08 (×2): qty 1

## 2016-10-08 MED ORDER — ALUM & MAG HYDROXIDE-SIMETH 200-200-20 MG/5ML PO SUSP: 30.0000 mL | Freq: Four times a day (QID) | ORAL | Status: DC | PRN

## 2016-10-08 MED ORDER — ZOLPIDEM TARTRATE 5 MG PO TABS: 5.0000 mg | ORAL_TABLET | Freq: Every evening | ORAL | Status: DC | PRN

## 2016-10-08 MED ORDER — OLANZAPINE-FLUOXETINE HCL 6-25 MG PO CAPS
1.0000 | ORAL_CAPSULE | Freq: Every day | ORAL | Status: DC
  Administered 2016-10-08: 1 via ORAL
  Filled 2016-10-08: qty 1

## 2016-10-08 NOTE — ED Notes (Signed)
Patient is aware that urine specimen is needed. During initial assessment patient is very tearful and appears paranoid. Looking around room and stating "are you the only one here because I don't trust to many people." Patient reports "I have a problem drinking. I usually drink 10-12 beers a day at least 4-5 days/week. I had to leave my job on last week because my drinking had spiraled out of control. I haven't had a drink since then surprisingly and that was last Tuesday. I have been drinking on and off for the past year and it has just worsened. I was recovering years ago. I also see a psychiatrist for bipolar but I have not taken my medications as I should since about March due to my drinking. I saw my psychiatrist yesterday and she filled my RX but I didn't take them. She advised me since my thoughts of suicide was worsening that I should come in to be seen so I called my mother to bring me in." When asked about thoughts or intent to harm self she stated " I really want to blow my head off with a gun and I am serious, the only thing is I don't have one but that is not the only means of doing myself in." During this statement the patient is looking around. She states " I am so paranoid and I feel like there is a hidden message here, like am I going to be arrested, or hurt?" Advised patient of the hospital policy and she is safe here. She teared up and stated "good because I know I need help if I am going to keep living"

## 2016-10-08 NOTE — ED Triage Notes (Signed)
Patient c/o SI with plan on blowing head off with a gun, patient denies having access to gun. Patient reports feeling very paranoid like people are watching her and out to get her.  Patient reports that last drink of ETOH was Tuesday before last.

## 2016-10-08 NOTE — ED Notes (Signed)
Bed: [facility] Expected date:  Expected time:  Means of arrival:  Comments: 

## 2016-10-08 NOTE — ED Notes (Signed)
Telepsych in process 

## 2016-10-08 NOTE — ED Provider Notes (Signed)
[facility]-EM[facility]Y DEPT Provider Note   CSN: [id number] Arrival date & time: 10/08/16  1826     History   Chief Complaint Chief Complaint  Patient presents with  . Suicidal    HPI Alicia Porter is a 46 y.o. female with a PMHx of PCOS, borderline DM2 (diet controlled), HLD, depression, anxiety, borderline personality disorder, and OSA on CPAP, who presents to the ED with complaints of suicidal ideations with a plan to "blow her brains out", as well as paranoid thoughts feeling like she is being watched, as well as anxiety. She was recently started on Symbyax 6-25mg  and vistaril 25mg  TID PRN 3 days ago, but hasn't had any improvement. She took symbyax last night, and vistaril earlier today. She reports that she recently quit drinking alcohol on 09/29/16, prior to that she was drinking 2-3 beers on days that she worked and about 6-12 beers on her days off (3 times per week). She denies illicit drug use. Admits to being a cigarette smoker. She denies HI or AVH. She is not taking any other medications, was previously on metformin and Lipitor however she stopped taking them about a year ago and at her last PCP visit in November 2017 her hemoglobin A1c was 5.8 and her lipids are fine aside from the triglycerides. Her PCP never restarted her medications so she assumed that she did not need to take them anymore. She denies any medical complaints at this time. She is here voluntarily.   The history is provided by the patient and medical records. No language interpreter was used.  Mental Health Problem  Presenting symptoms: depression and suicidal thoughts   Presenting symptoms: no hallucinations and no homicidal ideas   Onset quality:  Gradual Timing:  Constant Progression:  Worsening Chronicity:  Recurrent Context: recent medication change   Treatment compliance:  All of the time Time since last psychoactive medication taken:  12 hours Relieved by:  None tried Worsened by:  Nothing Ineffective  treatments:  None tried Associated symptoms: anxiety   Associated symptoms: no abdominal pain and no chest pain   Risk factors: hx of mental illness   Risk factors: no recent psychiatric admission     Past Medical History:  Diagnosis Date  . Depression   . Diabetes mellitus without complication (HCC)   . Hyperlipidemia   . Polycystic ovarian disease   . Polycystic ovarian disease 09/08/2012   takes Aldactone for it  . Sleep apnea     Patient Active Problem List   Diagnosis Date Noted  . Obstructive sleep apnea of adult 04/13/2014  . Sleep onset Insomnia 04/13/2014  . Persistent disorder of initiating or maintaining sleep 04/13/2014  . Parasomnia 04/13/2014  . Restless leg syndrome 04/13/2014  . CPAP (continuous positive airway pressure) dependence 04/13/2014  . Bruxism 04/13/2014  . PCOS (polycystic ovarian syndrome) 12/09/2012  . Encounter for long-term (current) use of other medications 12/09/2012  . Tobacco use disorder 12/09/2012  . Type II or unspecified type diabetes mellitus without mention of complication, not stated as uncontrolled 12/09/2012  . Major depressive disorder, recurrent (HCC) 01/24/2011  . Generalized anxiety disorder 01/24/2011  . Borderline personality disorder 01/24/2011    History reviewed. No pertinent surgical history.  OB History    No data available       Home Medications    Prior to Admission medications   Medication Sig Start Date End Date Taking? Authorizing Provider  atorvastatin (LIPITOR) 40 MG tablet TAKE 1 TABLET BY MOUTH AT BEDTIME.  "  OV NEEDED" 08/22/14   Wallis Bamberg, PA-C  eletriptan (RELPAX) 40 MG tablet Take 1 tablet (40 mg total) by mouth as needed for migraine or headache. One tablet by mouth at onset of headache. May repeat in 2 hours if headache persists or recurs. 04/10/13   Carmelina Dane, MD  lamoTRIgine (LAMICTAL) 100 MG tablet Take 200 mg by mouth daily.     [provider]  lithium carbonate 300 MG capsule  Take 450 mg by mouth 2 (two) times daily with a meal.     [provider]  LORazepam (ATIVAN) 1 MG tablet Take 2 mg by mouth 4 (four) times daily.     [provider]  meloxicam (MOBIC) 15 MG tablet Take 1 tablet (15 mg total) by mouth daily. 04/10/13   Carmelina Dane, MD  metFORMIN (GLUCOPHAGE-XR) 500 MG 24 hr tablet TAKE 4 TABLETS BY MOUTH DAILY "NO MORE REFILLS WITHOUT OFFICE VISIT) 3RD 03/16/14   Porfirio Oar, PA-C  OLANZapine-FLUoxetine (SYMBYAX) 6-25 MG per capsule Take 1 capsule by mouth at bedtime.    [provider]  spironolactone (ALDACTONE) 50 MG tablet Take 1 tablet (50 mg total) by mouth daily after breakfast. 08/08/13   Sherren Mocha, MD    Family History Family History  Problem Relation Age of Onset  . Heart disease Mother   . Diabetes Mother   . Hyperlipidemia Mother     Social History Social History  Substance Use Topics  . Smoking status: Current Every Day Smoker    Packs/day: 1.00  . Smokeless tobacco: Never Used  . Alcohol use Yes     Comment: last drink 09/29/16     Allergies   Levofloxacin; Sulfa antibiotics; and Wellbutrin [bupropion hcl]   Review of Systems Review of Systems  Constitutional: Negative for chills and fever.  Respiratory: Negative for shortness of breath.   Cardiovascular: Negative for chest pain.  Gastrointestinal: Negative for abdominal pain, constipation, diarrhea, nausea and vomiting.  Genitourinary: Negative for dysuria and hematuria.  Musculoskeletal: Negative for arthralgias and myalgias.  Skin: Negative for color change.  Allergic/Immunologic: Positive for immunocompromised state (borderline DM2, diet controlled).  Neurological: Negative for weakness and numbness.  Psychiatric/Behavioral: Positive for suicidal ideas. Negative for confusion, hallucinations and homicidal ideas. The patient is nervous/anxious.    All other systems reviewed and are negative for acute change except as noted in the HPI.      Physical Exam Updated Vital Signs BP (!) 124/98 (BP Location: Left Arm)   Pulse 85   Temp 98.6 F (37 C) (Oral)   Resp 20   Ht 5\' 6"  (1.676 m)   Wt 105.2 kg (232 lb)   SpO2 96%   BMI 37.45 kg/m   Physical Exam  Constitutional: She is oriented to person, place, and time. Vital signs are normal. She appears well-developed and well-nourished.  Non-toxic appearance. No distress.  Afebrile, nontoxic, NAD  HENT:  Head: Normocephalic and atraumatic.  Mouth/Throat: Oropharynx is clear and moist and mucous membranes are normal.  Eyes: Conjunctivae and EOM are normal. Right eye exhibits no discharge. Left eye exhibits no discharge.  Neck: Normal range of motion. Neck supple.  Cardiovascular: Normal rate, regular rhythm, normal heart sounds and intact distal pulses.  Exam reveals no gallop and no friction rub.   No murmur heard. Pulmonary/Chest: Effort normal and breath sounds normal. No respiratory distress. She has no decreased breath sounds. She has no wheezes. She has no rhonchi. She has no rales.  Abdominal: Soft. Normal appearance and bowel sounds are normal. She exhibits no distension. There is no tenderness. There is no rigidity, no rebound, no guarding, no CVA tenderness, no tenderness at McBurney's point and negative Murphy's sign.  Musculoskeletal: Normal range of motion.  Neurological: She is alert and oriented to person, place, and time. She has normal strength. No sensory deficit.  Skin: Skin is warm, dry and intact. No rash noted.  Psychiatric: Her mood appears anxious. She is not actively hallucinating. Thought content is paranoid. She exhibits a depressed mood. She expresses suicidal ideation. She expresses no homicidal ideation. She expresses suicidal plans. She expresses no homicidal plans.  Depressed and anxious affect, but pleasant and cooperative. Endorsing SI with a plan, denies HI or AVH, doesn't seem to be responding to internal stimuli. Endorsing paranoid thoughts.   Nursing note and vitals reviewed.    ED Treatments / Results  Labs (all labs ordered are listed, but only abnormal results are displayed) Labs Reviewed  COMPREHENSIVE METABOLIC PANEL - Abnormal; Notable for the following:       Result Value   Glucose, Bld 116 (*)    All other components within normal limits  ACETAMINOPHEN LEVEL - Abnormal; Notable for the following:    Acetaminophen (Tylenol), Serum <10 (*)    All other components within normal limits  CBC - Abnormal; Notable for the following:    WBC 12.5 (*)    Hemoglobin 15.9 (*)    MCH 34.3 (*)    All other components within normal limits  ETHANOL  SALICYLATE LEVEL  RAPID URINE DRUG SCREEN, HOSP PERFORMED  I-STAT BETA HCG BLOOD, ED ([facility], [facility], [facility] ONLY)    EKG  EKG Interpretation None       Radiology No results found.  Procedures Procedures (including critical care time)  Medications Ordered in ED Medications  acetaminophen (TYLENOL) tablet 650 mg (not administered)  zolpidem (AMBIEN) tablet 5 mg (not administered)  ondansetron (ZOFRAN) tablet 4 mg (not administered)  alum & mag hydroxide-simeth (MAALOX/MYLANTA) 200-200-20 MG/5ML suspension 30 mL (not administered)  nicotine (NICODERM CQ - dosed in mg/24 hours) patch 21 mg (not administered)  hydrOXYzine (ATARAX/VISTARIL) tablet 25 mg (not administered)  OLANZapine-FLUoxetine (SYMBYAX) 6-25 MG per capsule 1 capsule (not administered)     Initial Impression / Assessment and Plan / ED Course  I have reviewed the triage vital signs and the nursing notes.  Pertinent labs & imaging results that were available during my care of the patient were reviewed by me and considered in my medical decision making (see chart for details).     46 y.o. female here voluntarily for SI with a plan to blow her brains out, paranoid thoughts and anxiety. Denies HI/AVH or drug use. Prior alcohol drinker, stopped 9 days ago. +Tobacco user, cessation advised. Recently restarted on  symbyax and vistaril but hasn't yet seen benefit. Denies medical complaints. On exam, depressed and anxious appearing, but otherwise no other concerning examination findings. Will get clearance labs and TTS consult.   8:32 PM EtOH level undetectable. CBC mildly hemoconcentrated but essentially unremarkable. CMP WNL. Salicylate and acetaminophen levels WNL. HCG neg. UDS pending, but does not interfere with med clearance. Pt medically cleared at this time. Psych hold orders and home med orders placed. Please see TTS notes for further documentation of care/dispo. PLEASE NOTE THAT PT IS HERE VOLUNTARILY AT THIS TIME, IF PT TRIES TO LEAVE THEY WOULD NEED IVC PAPERWORK TAKEN OUT. Pt stable at time of med clearance.  Final Clinical Impressions(s) / ED Diagnoses   Final diagnoses:  Suicidal ideation  Paranoid ideation (HCC)  Anxiety  Tobacco user  History of alcohol use  Medical clearance for psychiatric admission    New Prescriptions New Prescriptions   No medications on [address] [place], [place] 10/08/16 2032    Shaune Pollack, MD 10/09/16 1200
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
## 2016-10-09 ENCOUNTER — Inpatient Hospital Stay (HOSPITAL_COMMUNITY)
Admission: AD | Admit: 2016-10-09 | Discharge: 2016-10-18 | DRG: 885 | Disposition: A | Discharge status: Home / Self Care | Payer: BLUE CROSS/BLUE SHIELD | Source: Intra-hospital | Attending: Psychiatry | Admitting: Psychiatry

## 2016-10-09 ENCOUNTER — Encounter (HOSPITAL_COMMUNITY): Payer: Self-pay | Admitting: *Deleted

## 2016-10-09 DIAGNOSIS — Z87898 Personal history of other specified conditions: Secondary | ICD-10-CM | POA: Diagnosis not present

## 2016-10-09 DIAGNOSIS — F411 Generalized anxiety disorder: Secondary | ICD-10-CM | POA: Diagnosis present

## 2016-10-09 DIAGNOSIS — F419 Anxiety disorder, unspecified: Secondary | ICD-10-CM

## 2016-10-09 DIAGNOSIS — F10239 Alcohol dependence with withdrawal, unspecified: Secondary | ICD-10-CM | POA: Diagnosis present

## 2016-10-09 DIAGNOSIS — F603 Borderline personality disorder: Secondary | ICD-10-CM | POA: Diagnosis present

## 2016-10-09 DIAGNOSIS — Z882 Allergy status to sulfonamides status: Secondary | ICD-10-CM

## 2016-10-09 DIAGNOSIS — F17200 Nicotine dependence, unspecified, uncomplicated: Secondary | ICD-10-CM | POA: Diagnosis not present

## 2016-10-09 DIAGNOSIS — IMO0002 Reserved for concepts with insufficient information to code with codable children: Secondary | ICD-10-CM

## 2016-10-09 DIAGNOSIS — Z8249 Family history of ischemic heart disease and other diseases of the circulatory system: Secondary | ICD-10-CM | POA: Diagnosis not present

## 2016-10-09 DIAGNOSIS — E119 Type 2 diabetes mellitus without complications: Secondary | ICD-10-CM | POA: Diagnosis present

## 2016-10-09 DIAGNOSIS — E785 Hyperlipidemia, unspecified: Secondary | ICD-10-CM | POA: Diagnosis present

## 2016-10-09 DIAGNOSIS — F1090 Alcohol use, unspecified, uncomplicated: Secondary | ICD-10-CM

## 2016-10-09 DIAGNOSIS — R45851 Suicidal ideations: Secondary | ICD-10-CM | POA: Diagnosis present

## 2016-10-09 DIAGNOSIS — F1994 Other psychoactive substance use, unspecified with psychoactive substance-induced mood disorder: Secondary | ICD-10-CM | POA: Diagnosis not present

## 2016-10-09 DIAGNOSIS — Z560 Unemployment, unspecified: Secondary | ICD-10-CM

## 2016-10-09 DIAGNOSIS — F332 Major depressive disorder, recurrent severe without psychotic features: Secondary | ICD-10-CM | POA: Diagnosis present

## 2016-10-09 DIAGNOSIS — Z9989 Dependence on other enabling machines and devices: Secondary | ICD-10-CM | POA: Diagnosis not present

## 2016-10-09 DIAGNOSIS — G2581 Restless legs syndrome: Secondary | ICD-10-CM | POA: Diagnosis present

## 2016-10-09 DIAGNOSIS — Z7984 Long term (current) use of oral hypoglycemic drugs: Secondary | ICD-10-CM | POA: Diagnosis not present

## 2016-10-09 DIAGNOSIS — E282 Polycystic ovarian syndrome: Secondary | ICD-10-CM | POA: Diagnosis present

## 2016-10-09 DIAGNOSIS — Z888 Allergy status to other drugs, medicaments and biological substances status: Secondary | ICD-10-CM | POA: Diagnosis not present

## 2016-10-09 DIAGNOSIS — G4733 Obstructive sleep apnea (adult) (pediatric): Secondary | ICD-10-CM | POA: Diagnosis present

## 2016-10-09 DIAGNOSIS — F333 Major depressive disorder, recurrent, severe with psychotic symptoms: Secondary | ICD-10-CM

## 2016-10-09 DIAGNOSIS — F17210 Nicotine dependence, cigarettes, uncomplicated: Secondary | ICD-10-CM | POA: Diagnosis present

## 2016-10-09 DIAGNOSIS — F22 Delusional disorders: Secondary | ICD-10-CM | POA: Diagnosis not present

## 2016-10-09 DIAGNOSIS — F10230 Alcohol dependence with withdrawal, uncomplicated: Secondary | ICD-10-CM | POA: Diagnosis present

## 2016-10-09 DIAGNOSIS — F39 Unspecified mood [affective] disorder: Secondary | ICD-10-CM | POA: Diagnosis not present

## 2016-10-09 DIAGNOSIS — Z79899 Other long term (current) drug therapy: Secondary | ICD-10-CM | POA: Diagnosis not present

## 2016-10-09 DIAGNOSIS — Z8349 Family history of other endocrine, nutritional and metabolic diseases: Secondary | ICD-10-CM

## 2016-10-09 DIAGNOSIS — F1099 Alcohol use, unspecified with unspecified alcohol-induced disorder: Secondary | ICD-10-CM | POA: Diagnosis not present

## 2016-10-09 DIAGNOSIS — Z881 Allergy status to other antibiotic agents status: Secondary | ICD-10-CM

## 2016-10-09 DIAGNOSIS — Z833 Family history of diabetes mellitus: Secondary | ICD-10-CM | POA: Diagnosis not present

## 2016-10-09 DIAGNOSIS — G4700 Insomnia, unspecified: Secondary | ICD-10-CM | POA: Diagnosis present

## 2016-10-09 DIAGNOSIS — Z915 Personal history of self-harm: Secondary | ICD-10-CM

## 2016-10-09 DIAGNOSIS — R450 Nervousness: Secondary | ICD-10-CM | POA: Diagnosis not present

## 2016-10-09 DIAGNOSIS — Z7289 Other problems related to lifestyle: Secondary | ICD-10-CM | POA: Diagnosis not present

## 2016-10-09 MED ORDER — HYDROXYZINE HCL 25 MG PO TABS
25.0000 mg | ORAL_TABLET | Freq: Three times a day (TID) | ORAL | Status: DC | PRN
  Administered 2016-10-09 – 2016-10-18 (×13): 25 mg via ORAL
  Filled 2016-10-09 (×13): qty 1

## 2016-10-09 MED ORDER — TRAZODONE HCL 50 MG PO TABS
50.0000 mg | ORAL_TABLET | Freq: Every evening | ORAL | Status: DC | PRN
  Administered 2016-10-09 – 2016-10-11 (×5): 50 mg via ORAL
  Filled 2016-10-09 (×10): qty 1

## 2016-10-09 MED ORDER — ACETAMINOPHEN 325 MG PO TABS: 650.0000 mg | ORAL_TABLET | ORAL | Status: DC | PRN

## 2016-10-09 MED ORDER — ONDANSETRON HCL 4 MG PO TABS
4.0000 mg | ORAL_TABLET | Freq: Three times a day (TID) | ORAL | Status: DC | PRN
Start: 2016-10-09 — End: 2016-10-18

## 2016-10-09 MED ORDER — HALOPERIDOL 1 MG PO TABS
0.5000 mg | ORAL_TABLET | Freq: Two times a day (BID) | ORAL | Status: DC
  Administered 2016-10-09: 0.5 mg via ORAL
  Filled 2016-10-09: qty 1

## 2016-10-09 MED ORDER — RISPERIDONE 0.5 MG PO TABS: 0.5000 mg | ORAL_TABLET | Freq: Two times a day (BID) | ORAL | Status: DC

## 2016-10-09 MED ORDER — NALTREXONE HCL 50 MG PO TABS
25.0000 mg | ORAL_TABLET | Freq: Every day | ORAL | Status: DC
  Administered 2016-10-09: 25 mg via ORAL
  Filled 2016-10-09: qty 1

## 2016-10-09 MED ORDER — NALTREXONE HCL 50 MG PO TABS
25.0000 mg | ORAL_TABLET | Freq: Every day | ORAL | Status: DC
  Administered 2016-10-10: 25 mg via ORAL
  Filled 2016-10-09 (×4): qty 1

## 2016-10-09 MED ORDER — MAGNESIUM HYDROXIDE 400 MG/5ML PO SUSP
30.0000 mL | Freq: Every day | ORAL | Status: DC | PRN
  Administered 2016-10-15: 30 mL via ORAL
  Filled 2016-10-09: qty 30

## 2016-10-09 MED ORDER — NICOTINE 21 MG/24HR TD PT24
21.0000 mg | MEDICATED_PATCH | Freq: Every day | TRANSDERMAL | Status: DC
  Administered 2016-10-10 – 2016-10-18 (×9): 21 mg via TRANSDERMAL
  Filled 2016-10-09 (×12): qty 1

## 2016-10-09 MED ORDER — CITALOPRAM HYDROBROMIDE 10 MG PO TABS
20.0000 mg | ORAL_TABLET | Freq: Every day | ORAL | Status: DC
Start: 2016-10-09 — End: 2016-10-09
  Administered 2016-10-09: 20 mg via ORAL
  Filled 2016-10-09: qty 2

## 2016-10-09 MED ORDER — CITALOPRAM HYDROBROMIDE 20 MG PO TABS
20.0000 mg | ORAL_TABLET | Freq: Every day | ORAL | Status: DC
  Administered 2016-10-10 – 2016-10-18 (×9): 20 mg via ORAL
  Filled 2016-10-09 (×12): qty 1

## 2016-10-09 MED ORDER — ALUM & MAG HYDROXIDE-SIMETH 200-200-20 MG/5ML PO SUSP: 30.0000 mL | Freq: Four times a day (QID) | ORAL | Status: DC | PRN

## 2016-10-09 MED ORDER — HALOPERIDOL 0.5 MG PO TABS
0.5000 mg | ORAL_TABLET | Freq: Two times a day (BID) | ORAL | Status: DC
  Administered 2016-10-09 – 2016-10-11 (×4): 0.5 mg via ORAL
  Filled 2016-10-09 (×9): qty 1

## 2016-10-09 NOTE — BH Assessment (Signed)
[facility] Assessment Progress Note  Per Thedore MinsMojeed Akintayo, MD, this pt requires psychiatric hospitalization at this time.  Malva LimesLinsey Strader, RN, [facility]; they will be ready to receive pt at 13:00.  Pt has signed Voluntary Admission and Consent for Treatment, as well as Consent to Release Information to Dr Evelene CroonKaur, to her mother, and to a friend, and a notification call has been placed to the former.  [facility]  Pt's nurse, Diane, has been notified, and agrees to send original paperwork along with pt via Juel [place], and to call report to [phone].  Doylene Canninghomas Zakry Caso, MA Triage Specialist [phone]

## 2016-10-09 NOTE — Progress Notes (Signed)
10/09/16 1333:  Pt was with visitor and preparing to discharge.   Caroll RancherMarjette Larysa Pall, LRT/CTRS

## 2016-10-09 NOTE — Tx Team (Signed)
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  The patient and family have been given the opportunity to ask questions and make suggestions.  Georges Victorio, [place], [place] 10/09/2016, 3:15 PM

## 2016-10-09 NOTE — Progress Notes (Signed)
Patient ID: Deneise LeverSharon A Porter, female   DOB: 07/10/1970, 46 y.o.   MRN: [id number] Alicia Porter is a 46 year old female pt admitted on voluntary basis. On admission, Alicia Porter appears anxious and appears to have some thought blocking. She does endorse depression and passive SI but is able to contract for safety while in the hospital. 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 She reports recently seeing Dr. Evelene CroonKaur and spoke about how she has recently been placed back on medications. She reports that she has been off and on with her medications throughout the year. She reports that she had been drinking daily up until last Tuesday and reports that she was feeling sick and nauseous last week but on admission does not report any withdrawal symptoms. Alicia Porter was oriented to the unit and safety maintained.

## 2016-10-09 NOTE — ED Notes (Addendum)
Introduced self to patient/family. Pt oriented to unit expectations.  Assessed pt for:  A) Anxiety &/or agitation: Pt is cooperative, but anxious. She has requested medication for anxiety. Her mood is depressed and irritable and her affect is flat.  She said that she is paranoid. Pt has remained in bed all morning. Her mother has come from [place] to visit and show support. Pt does not feel like living anymore.   S) Safety: Safety maintained with q-15-minute checks and hourly rounds by staff.  A) ADLs: Pt able to perform ADLs independently.  P) Pick-Up (room cleanliness): Pt's room clean and free of clutter.

## 2016-10-09 NOTE — Consult Note (Signed)
[facility] Face-to-Face Psychiatry Consult   Reason for Consult:  Suicide plan Referring Physician:  EDP Patient Identification: Alicia Porter MRN:  [id number] Principal Diagnosis: Alcohol dependence with withdrawal, uncomplicated [facility]) Diagnosis:   Patient Active Problem List   Diagnosis Date Noted  . Major depressive disorder, recurrent episode, severe, with psychosis ([place]) [F33.3] 10/09/2016    Priority: High  . Alcohol dependence with withdrawal, uncomplicated ([place]) [G88.110] 10/09/2016    Priority: High  . Obstructive sleep apnea of adult [G47.33] 04/13/2014  . Sleep onset Insomnia [G47.00] 04/13/2014  . Persistent disorder of initiating or maintaining sleep [G47.00] 04/13/2014  . Parasomnia [G47.50] 04/13/2014  . Restless leg syndrome [G25.81] 04/13/2014  . CPAP (continuous positive airway pressure) dependence [Z99.89] 04/13/2014  . Bruxism [F45.8] 04/13/2014  . PCOS (polycystic ovarian syndrome) [E28.2] 12/09/2012  . Encounter for long-term (current) use of other medications [Z79.899] 12/09/2012  . Tobacco use disorder [F17.200] 12/09/2012  . Type II or unspecified type diabetes mellitus without mention of complication, not stated as uncontrolled [E11.9] 12/09/2012  . Major depressive disorder, recurrent ([place]) [F33.9] 01/24/2011  . Generalized anxiety disorder [F41.1] 01/24/2011  . Borderline personality disorder [F60.3] 01/24/2011    Total Time spent with patient: 45 minutes  Subjective:   Alicia Porter is a 46 y.o. female patient admitted with suicide plan.  HPI:  46 yo female who presented to the ED with an increase in depression with a suicide plan to shoot herself and "very paranoid and extremely anxious."  Her mother is at her bedside and supportive.  She is a patient's of Dr. Toy Care but just restarted seeing her recently (once) after a year.  Takirah stopped her Symbax because of the weight gain but agreeable to another medication.  She has been drinking until last  Tuesday.  Multiple psychiatric hospitalizations, last one in 2012.  No homicidal ideations, withdrawal symptoms, or hallucinations.  Past Psychiatric History: depression, alcohol dependence  Risk to Self: Suicidal Ideation: Yes-Currently Present Suicidal Intent: Yes-Currently Present Is patient at risk for suicide?: Yes Suicidal Plan?: Yes-Currently Present Specify Current Suicidal Plan: pt reports a plan to "blow her brains out" Access to Means: No Specify Access to Suicidal Means: pt denies that she has access to guns  What has been your use of drugs/alcohol within the last 12 months?: reports to binge drinking alcohol for days at a time  How many times?: 1 Triggers for Past Attempts: Spouse contact Intentional Self Injurious Behavior: None Risk to Others: Homicidal Ideation: No Thoughts of Harm to Others: No Current Homicidal Intent: No Current Homicidal Plan: No Access to Homicidal Means: No History of harm to others?: No Assessment of Violence: None Noted Does patient have access to weapons?: No Criminal Charges Pending?: No Does patient have a court date: No Prior Inpatient Therapy: Prior Inpatient Therapy: Yes Prior Therapy Dates: 2012 Prior Therapy Facilty/Provider(s): [place], [place]  Reason for Treatment: MDD, SA Prior Outpatient Therapy: Prior Outpatient Therapy: Yes Prior Therapy Dates: CURRENT Prior Therapy Facilty/Provider(s): DR. Toy Care, MD Reason for Treatment: MED MANAGEMENT  Does patient have an ACCT team?: No Does patient have Intensive In-House Services?  : No Does patient have [facility] services? : No Does patient have P4CC services?: No  Past Medical History:  Past Medical History:  Diagnosis Date  . Depression   . Diabetes mellitus without complication ([place])   . Hyperlipidemia   . Polycystic ovarian disease   . Polycystic ovarian disease 09/08/2012   takes Aldactone for it  . Sleep apnea  History reviewed. No pertinent surgical history. Family  History:  Family History  Problem Relation Age of Onset  . Heart disease Mother   . Diabetes Mother   . Hyperlipidemia Mother    Family Psychiatric  History: depression Social History:  History  Alcohol Use  . Yes    Comment: last drink 09/29/16     History  Drug Use No    Social History   Social History  . Marital status: Single    Spouse name: N/A  . Number of children: N/A  . Years of education: N/A   Social History Main Topics  . Smoking status: Current Every Day Smoker    Packs/day: 1.00  . Smokeless tobacco: Never Used  . Alcohol use Yes     Comment: last drink 09/29/16  . Drug use: No  . Sexual activity: Yes   Other Topics Concern  . None   Social History Narrative  . None   Additional Social History:    Allergies:   Allergies  Allergen Reactions  . Levofloxacin Other (See Comments)    Tendon damage  . Sulfa Antibiotics Nausea And Vomiting  . Wellbutrin [Bupropion Hcl] Other (See Comments)    "Crawling out of my skin"    Labs:  Results for orders placed or performed during the hospital encounter of 10/08/16 (from the past 48 hour(s))  Rapid urine drug screen (hospital performed)     Status: None   Collection Time: 10/08/16  7:07 PM  Result Value Ref Range   Opiates NONE DETECTED NONE DETECTED   Cocaine NONE DETECTED NONE DETECTED   Benzodiazepines NONE DETECTED NONE DETECTED   Amphetamines NONE DETECTED NONE DETECTED   Tetrahydrocannabinol NONE DETECTED NONE DETECTED   Barbiturates NONE DETECTED NONE DETECTED    Comment:        DRUG SCREEN FOR MEDICAL PURPOSES ONLY.  IF CONFIRMATION IS NEEDED FOR ANY PURPOSE, NOTIFY LAB WITHIN 5 DAYS.        LOWEST DETECTABLE LIMITS FOR URINE DRUG SCREEN Drug Class       Cutoff (ng/mL) Amphetamine      1000 Barbiturate      200 Benzodiazepine   259 Tricyclics       563 Opiates          300 Cocaine          300 THC              50   Comprehensive metabolic panel     Status: Abnormal   Collection  Time: 10/08/16  7:46 PM  Result Value Ref Range   Sodium 139 135 - 145 mmol/L   Potassium 4.0 3.5 - 5.1 mmol/L   Chloride 105 101 - 111 mmol/L   CO2 25 22 - 32 mmol/L   Glucose, Bld 116 (H) 65 - 99 mg/dL   BUN 6 6 - 20 mg/dL   Creatinine, Ser 0.76 0.44 - 1.00 mg/dL   Calcium 9.8 8.9 - 10.3 mg/dL   Total Protein 7.5 6.5 - 8.1 g/dL   Albumin 4.6 3.5 - 5.0 g/dL   AST 19 15 - 41 U/L   ALT 21 14 - 54 U/L   Alkaline Phosphatase 68 38 - 126 U/L   Total Bilirubin 0.6 0.3 - 1.2 mg/dL   GFR calc non Af Amer >60 >60 mL/min   GFR calc Af Amer >60 >60 mL/min    Comment: (NOTE) The eGFR has been calculated using the CKD EPI equation. This calculation has not  been validated in all clinical situations. eGFR's persistently <60 mL/min signify possible Chronic Kidney Disease.    Anion gap 9 5 - 15  Ethanol     Status: None   Collection Time: 10/08/16  7:46 PM  Result Value Ref Range   Alcohol, Ethyl (B) <5 <5 mg/dL    Comment:        LOWEST DETECTABLE LIMIT FOR SERUM ALCOHOL IS 5 mg/dL FOR MEDICAL PURPOSES ONLY   Salicylate level     Status: None   Collection Time: 10/08/16  7:46 PM  Result Value Ref Range   Salicylate Lvl <4.0 2.8 - 30.0 mg/dL  Acetaminophen level     Status: Abnormal   Collection Time: 10/08/16  7:46 PM  Result Value Ref Range   Acetaminophen (Tylenol), Serum <10 (L) 10 - 30 ug/mL    Comment:        THERAPEUTIC CONCENTRATIONS VARY SIGNIFICANTLY. A RANGE OF 10-30 ug/mL MAY BE AN EFFECTIVE CONCENTRATION FOR MANY PATIENTS. HOWEVER, SOME ARE BEST TREATED AT CONCENTRATIONS OUTSIDE THIS RANGE. ACETAMINOPHEN CONCENTRATIONS >150 ug/mL AT 4 HOURS AFTER INGESTION AND >50 ug/mL AT 12 HOURS AFTER INGESTION ARE OFTEN ASSOCIATED WITH TOXIC REACTIONS.   cbc     Status: Abnormal   Collection Time: 10/08/16  7:46 PM  Result Value Ref Range   WBC 12.5 (H) 4.0 - 10.5 K/uL   RBC 4.64 3.87 - 5.11 MIL/uL   Hemoglobin 15.9 (H) 12.0 - 15.0 g/dL   HCT 44.9 36.0 - 46.0 %   MCV  96.8 78.0 - 100.0 fL   MCH 34.3 (H) 26.0 - 34.0 pg   MCHC 35.4 30.0 - 36.0 g/dL   RDW 13.5 11.5 - 15.5 %   Platelets 276 150 - 400 K/uL  I-Stat beta hCG blood, ED     Status: None   Collection Time: 10/08/16  7:55 PM  Result Value Ref Range   I-stat hCG, quantitative <5.0 <5 mIU/mL   Comment 3            Comment:   GEST. AGE      CONC.  (mIU/mL)   <=1 WEEK        5 - 50     2 WEEKS       50 - 500     3 WEEKS       100 - 10,000     4 WEEKS     1,000 - 30,000        FEMALE AND NON-PREGNANT FEMALE:     LESS THAN 5 mIU/mL     Current Facility-Administered Medications  Medication Dose Route Frequency Provider Last Rate Last Dose  . acetaminophen (TYLENOL) tablet 650 mg  650 mg Oral Q4H PRN Street, [place], [place]      . alum & mag hydroxide-simeth (MAALOX/MYLANTA) 200-200-20 MG/5ML suspension 30 mL  30 mL Oral Q6H PRN Street, [place], [place]      . hydrOXYzine (ATARAX/VISTARIL) tablet 25 mg  25 mg Oral TID PRN Street, [place], PA-C   25 mg at 10/09/16 0805  . nicotine (NICODERM CQ - dosed in mg/24 hours) patch 21 mg  21 mg Transdermal Daily [address] [place], [place]   21 mg at 10/09/16 1042  . OLANZapine-FLUoxetine (SYMBYAX) 6-25 MG per capsule 1 capsule  1 capsule Oral [address], [place], [place]   1 capsule at 10/08/16 2145  . ondansetron (ZOFRAN) tablet 4 mg  4 mg Oral Q8H PRN Street, [place], [place]       Current Outpatient  Prescriptions  Medication Sig Dispense Refill  . hydrOXYzine (ATARAX/VISTARIL) 25 MG tablet Take 25 mg by mouth 3 (three) times daily as needed for anxiety.    Marland Kitchen OLANZapine-FLUoxetine (SYMBYAX) 6-25 MG per capsule Take 1 capsule by mouth at bedtime.    Marland Kitchen atorvastatin (LIPITOR) 40 MG tablet TAKE 1 TABLET BY MOUTH AT BEDTIME.  "OV NEEDED" (Patient not taking: Reported on 10/08/2016) 30 tablet 0  . eletriptan (RELPAX) 40 MG tablet Take 1 tablet (40 mg total) by mouth as needed for migraine or headache. One tablet by mouth at onset of headache. May repeat in 2 hours if  headache persists or recurs. (Patient not taking: Reported on 10/08/2016) 10 tablet 12  . meloxicam (MOBIC) 15 MG tablet Take 1 tablet (15 mg total) by mouth daily. (Patient not taking: Reported on 10/08/2016) 30 tablet 5  . metFORMIN (GLUCOPHAGE-XR) 500 MG 24 hr tablet TAKE 4 TABLETS BY MOUTH DAILY "NO MORE REFILLS WITHOUT OFFICE VISIT) 3RD (Patient not taking: Reported on 10/08/2016) 60 tablet 0  . spironolactone (ALDACTONE) 50 MG tablet Take 1 tablet (50 mg total) by mouth daily after breakfast. (Patient not taking: Reported on 10/08/2016) 90 tablet 3    Musculoskeletal: Strength & Muscle Tone: within normal limits Gait & Station: normal Patient leans: N/A  Psychiatric Specialty Exam: Physical Exam  Constitutional: She is oriented to person, place, and time. She appears well-developed and well-nourished.  HENT:  Head: Normocephalic.  Neck: Normal range of motion.  Respiratory: Effort normal.  Musculoskeletal: Normal range of motion.  Neurological: She is alert and oriented to person, place, and time.  Psychiatric: Her speech is normal and behavior is normal. Judgment normal. Cognition and memory are normal. She exhibits a depressed mood. She expresses suicidal ideation. She expresses suicidal plans.    Review of Systems  Psychiatric/Behavioral: Positive for depression, substance abuse and suicidal ideas.  All other systems reviewed and are negative.   Blood pressure 122/73, pulse 60, temperature 98.4 F (36.9 C), temperature source Oral, resp. rate 19, height _0  (1.676 m), weight 105.2 kg (232 lb), SpO2 98 %.Body mass index is 37.45 kg/m.  General Appearance: Disheveled  Eye Contact:  Fair  Speech:  Normal Rate  Volume:  Normal  Mood:  Anxious and Depressed  Affect:  Blunt  Thought Process:  Coherent and Descriptions of Associations: Intact  Orientation:  Full (Time, Place, and Person)  Thought Content:  Rumination, paranoia  Suicidal Thoughts:  Yes.  with intent/plan   Homicidal Thoughts:  No  Memory:  Immediate;   Fair Recent;   Fair Remote;   Fair  Judgement:  Impaired  Insight:  Fair  Psychomotor Activity:  Decreased  Concentration:  Concentration: Fair and Attention Span: Fair  Recall:  [employer] of Knowledge:  Fair  Language:  Good  Akathisia:  No  Handed:  Right  AIMS (if indicated):     Assets:  Housing Leisure Time Physical Health Resilience Social Support  ADL's:  Intact  Cognition:  WNL  Sleep:        Treatment Plan Summary: Daily contact with patient to assess and evaluate symptoms and progress in treatment and Medication management:  Major depressive disorder, recurrent, severe with psychosis: -Crisis stabilization -Medication management:  Started Celexa 20 mg daily for depression, Risperdal 0.5 mg BID for psychosis, and vistaril 25 mg TID PRN anxiety -Individual counseling  Disposition: Recommend psychiatric Inpatient admission when medically cleared.  Waylan Boga, NP 10/09/2016 12:01 PM Patient seen face-to-face for psychiatric  evaluation, chart reviewed and case discussed with the physician extender and developed treatment plan. Reviewed the information documented and agree with the treatment plan. Corena Pilgrim, MD

## 2016-10-10 DIAGNOSIS — F411 Generalized anxiety disorder: Secondary | ICD-10-CM

## 2016-10-10 DIAGNOSIS — F17210 Nicotine dependence, cigarettes, uncomplicated: Secondary | ICD-10-CM

## 2016-10-10 DIAGNOSIS — F333 Major depressive disorder, recurrent, severe with psychotic symptoms: Principal | ICD-10-CM

## 2016-10-10 DIAGNOSIS — R45851 Suicidal ideations: Secondary | ICD-10-CM

## 2016-10-10 DIAGNOSIS — F22 Delusional disorders: Secondary | ICD-10-CM

## 2016-10-10 DIAGNOSIS — IMO0002 Reserved for concepts with insufficient information to code with codable children: Secondary | ICD-10-CM

## 2016-10-10 DIAGNOSIS — R450 Nervousness: Secondary | ICD-10-CM

## 2016-10-10 DIAGNOSIS — F1994 Other psychoactive substance use, unspecified with psychoactive substance-induced mood disorder: Secondary | ICD-10-CM

## 2016-10-10 DIAGNOSIS — Z560 Unemployment, unspecified: Secondary | ICD-10-CM

## 2016-10-10 DIAGNOSIS — F1099 Alcohol use, unspecified with unspecified alcohol-induced disorder: Secondary | ICD-10-CM

## 2016-10-10 MED ORDER — NALTREXONE HCL 50 MG PO TABS
50.0000 mg | ORAL_TABLET | Freq: Every day | ORAL | Status: DC
Start: 2016-10-11 — End: 2016-10-18
  Administered 2016-10-11 – 2016-10-18 (×8): 50 mg via ORAL
  Filled 2016-10-10 (×10): qty 1

## 2016-10-10 NOTE — [facility] Group Notes (Signed)
Identifying Needs   Date:  10/10/2016  Time:   1300 Type of Therapy:  Nurse Education  /  The group focuses on teaching patients how to identify their needs and then how to develop skills needed to get them met.  Participation Level:  Did Not Attend  Participation Quality:    Affect:    Cognitive:    Insight:    Engagement in Group:    Modes of Intervention:    Summary of Progress/Problems:  Lauralyn Primes 10/10/2016, 2:35 PM

## 2016-10-10 NOTE — [facility] Suicide Risk Assessment (Signed)
[facility]   Nursing information obtained from:    Demographic factors:    Current Mental Status:    Loss Factors:    Historical Factors:    Risk Reduction Factors:     Total Time spent with patient: 20 minutes Principal Problem: <principal problem not specified> Diagnosis:   Patient Active Problem List   Diagnosis Date Noted  . Major depressive disorder, recurrent episode, severe, with psychosis ([facility]) [F33.3] 10/09/2016  . Alcohol dependence with withdrawal, uncomplicated ([facility]) [F10.230] 10/09/2016  . Major depressive disorder, recurrent episode, severe, with psychotic behavior ([facility]) [F33.3] 10/09/2016  . Obstructive sleep apnea of adult [G47.33] 04/13/2014  . Sleep onset Insomnia [G47.00] 04/13/2014  . Persistent disorder of initiating or maintaining sleep [G47.00] 04/13/2014  . Parasomnia [G47.50] 04/13/2014  . Restless leg syndrome [G25.81] 04/13/2014  . CPAP (continuous positive airway pressure) dependence [Z99.89] 04/13/2014  . Bruxism [F45.8] 04/13/2014  . PCOS (polycystic ovarian syndrome) [E28.2] 12/09/2012  . Encounter for long-term (current) use of other medications [Z79.899] 12/09/2012  . Tobacco use disorder [F17.200] 12/09/2012  . Type II or unspecified type diabetes mellitus without mention of complication, not stated as uncontrolled [E11.9] 12/09/2012  . Major depressive disorder, recurrent ([facility]) [F33.9] 01/24/2011  . Generalized anxiety disorder [F41.1] 01/24/2011  . Borderline personality disorder [F60.3] 01/24/2011   Subjective Data:  46 y.o Caucasian female, single, lives alone, no kids. 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 Worried she is likely going to lose her license. Background history of childhood trauma,  AUD and unspecified mood disorder. Has been drinking since her mid teens. Her mom was visiting from [place] and encouraged her to come to the hospital. Reports worsening suicidal thoughts. Expressed thoughts of blowing her own brain  off with a gun. Has attempted to drown self in the past. Expressed feelings as she is being watched. This makes her feel paranoid. Last drink was four days ago. Routine labs are essientially normal.  Says she was forced to leave her job about a week ago. She has been drinking more since then. Says she just has this feeling as if something terrible is about to happen to her. Has negative interpretation of neurtral events. She applied for FMLA but not sure if it would be honored. Overwhelmed with recent turn of events. Denies any other stressors. Reports past suicidal behavior. Got drunk while in the bath tub. Expected to drown self in the process. Has had past addiction treatment. First at age of 46 years. Second was three years ago. Did ninety days. Has had past DUI's and arrests while intoxicated. Past periods of depression and hypomania in context of substance use. She has been tried on multiple medications by her psychiatrist. Recently started on Citalopram. Has consented to use of naltrexone. Withdrawal symptoms are less. No sweatiness, no headaches. No retching, nausea or vomiting. No fullness in the head. No visual, tactile or auditory hallucination. No internal restlessness. No homicidal thoughts. No thoughts of violence.     Continued Clinical Symptoms:  Alcohol Use Disorder Identification Test Final Score (AUDIT): 34 The "Alcohol Use Disorders Identification Test", Guidelines for Use in Primary Care, Second Edition.  World Science writerHealth Organization [facility](WHO). Score between 0-7:  no or low risk or alcohol related problems. Score between 8-15:  moderate risk of alcohol related problems. Score between 16-19:  high risk of alcohol related problems. Score 20 or above:  warrants further diagnostic evaluation for alcohol dependence and treatment.   CLINICAL FACTORS:   Alcohol/Substance Abuse/Dependencies  Musculoskeletal: Strength & Muscle Tone: within normal limits Gait & Station: normal Patient  leans: N/A  Psychiatric Specialty Exam: Physical Exam  ROS  Blood pressure 115/88, pulse 96, temperature 98.8 F (37.1 C), temperature source Oral, resp. rate 18, height 5\' 6"  (1.676 m), weight 101.6 kg (224 lb).Body mass index is 36.15 kg/m.  General Appearance: overweight, flushed, poor personal care. Not shaky, not sweaty, not confused. Not unsteady, normal conjugate eye movements. Not internally distressed. Appropriate behavior.   Eye Contact:  Minimal  Speech:  Clear and Coherent and Normal Rate  Volume:  Normal  Mood:  Depressed and Hopeless  Affect:  Blunted  Thought Process:  Linear  Orientation:  Full (Time, Place, and Person)  Thought Content:  Rumination  Suicidal Thoughts:  Yes.  without intent/plan  Homicidal Thoughts:  No  Memory:  Immediate;   Fair Recent;   Poor Remote;   Fair  Judgement:  Fair  Insight:  Fair  Psychomotor Activity:  Decreased  Concentration:  Concentration: Fair and Attention Span: Fair  Recall:  [employer] of Knowledge:  Fair  Language:  Good  Akathisia:  Negative  Handed:    AIMS (if indicated):     Assets:  Communication Skills Desire for Improvement Resilience  ADL's:  Fair  Cognition:  WNL  Sleep:         COGNITIVE FEATURES THAT CONTRIBUTE TO RISK:  Closed-mindedness    SUICIDE RISK:   Moderate:  Frequent suicidal ideation with limited intensity, and duration, some specificity in terms of plans, no associated intent, good self-control, limited dysphoria/symptomatology, some risk factors present, and identifiable protective factors, including available and accessible social support.  PLAN OF CARE:  1. Alcohol withdrawal protocol 2. Continue citalopram at current dose 3. Naltrexone 50 mg daily 4. Suicide precautions 5. SW would obtain collateral from her family.   I certify that inpatient services furnished can reasonably be expected to improve the patient's condition.   Georgiann Cocker, MD 10/10/2016, 1:14 PM

## 2016-10-10 NOTE — Progress Notes (Signed)
D: Pt passive SI-contracts for safety. Pt is pleasant and cooperative. Pt was talking to another nurse and pt had a lighter in her hand, pt denied having any cigarettes. Pt stated she was extremely paranoid.   A: Pt was offered support and encouragement. Pt was given scheduled medications. Pt was encourage to attend groups. Q 15 minute checks were done for safety.   R:Pt attends groups and interacts well with peers and staff. Pt is taking medication. Pt has no complaints.Pt receptive to treatment and safety maintained on unit.

## 2016-10-10 NOTE — [facility] Counselor (Signed)
Unsuccessful attempt to complete PSA as patient participating in RN group.   Catherine C Harrill, LCSW 
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
## 2016-10-10 NOTE — [facility] Group Notes (Signed)
[facility] LCSW Group Therapy Note  10/10/2016  10:15 to 11:15AM  Type of Therapy and Topic:  Group Therapy: Avoiding Self-Sabotaging and Enabling Behaviors  Participation Level:  Did Not Attend; invited to participate yet did not despite overhead announcement and encouragement by CSW   Description of Group The main focus of today's process group to discuss what "self-sabotage" means and use motivational iInterviewing to discuss what benefits, negative or positive, were involved in a self-identified self-sabotaging behavior. We then talked about reasons the patient may want to change the behavior and their current desire to change.    Andrik Sandt C Williams Dietrick, LCSW 
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
## 2016-10-10 NOTE — Progress Notes (Signed)
[facility] Group Notes:  (Nursing/MHT/Case Management/Adjunct)  Date:  10/10/2016  Time:  11:26 PM  Type of Therapy:  Psychoeducational Skills  Participation Level:  Minimal  Participation Quality:  Attentive  Affect:  Blunted  Cognitive:  Appropriate  Insight:  Appropriate  Engagement in Group:  Limited  Modes of Intervention:  Education  Summary of Progress/Problems: Patient verbalized that she had a rough start to her day, but things improved for her later in the afternoon. As for the theme of the day, her coping skill will be to talk more frequently with the staff.   Hazle CocaGOODMAN, Haivyn Oravec S 10/10/2016, 11:26 PM

## 2016-10-10 NOTE — Progress Notes (Signed)
D Pt spent the better part of the early day asleep in her bed. She got OOB, when prompted by staff...came to the med window and took her ssheduled meds and then returned to sleep. A She is sad, depressed and has a flat affect. She reports that " the voices" are bothering her...that she realizes not taking her medication " has really messed  me up".  She refused to completed her daily assessment but contracted for safety with this Clinical research associatewriter. R Safety is in place.

## 2016-10-11 DIAGNOSIS — Z7289 Other problems related to lifestyle: Secondary | ICD-10-CM

## 2016-10-11 MED ORDER — HALOPERIDOL 1 MG PO TABS
1.0000 mg | ORAL_TABLET | Freq: Two times a day (BID) | ORAL | Status: DC
  Administered 2016-10-11 – 2016-10-13 (×4): 1 mg via ORAL
  Filled 2016-10-11 (×8): qty 1

## 2016-10-11 NOTE — Progress Notes (Signed)
Writer spoke with patient 1:1 and she reports her day not going so well. She reported that she was feeling paranoid earlier in the day until she took some medicine that seemed to help but she felt suicidal and talked with a staff member about her feelings. She denies si currently and consented to seek staff if thoughts return. She appears guarded and some thought blocking during our conversation. She attended group and requested medications shortly after group. Support given and safety maintained on unit with 15 min checks.

## 2016-10-11 NOTE — [facility] Group Notes (Signed)
   Date:  10/11/2016  Time:  1300  Type of Therapy:  Nurse Education  /  The group focuses on teaching patients how to identify unhealthy beahiors and substitute them with healthier ones.  Participation Level:  Active  Participation Quality:  Attentive  Affect:  Appropriate  Cognitive:  Alert  Insight:  Limited  Engagement in Group:  Engaged  Modes of Intervention:  Education  Summary of Progress/Problems:  Rich BraveDuke, Ellysia Char Lynn 10/11/2016, 3:18 PM

## 2016-10-11 NOTE — [facility] Group Notes (Signed)
[facility] LCSW Group Therapy Note   10/11/2016  10:10 to 11:10 AM   Type of Therapy and Topic: Group Therapy: Feelings Around Returning Home & Establishing a Supportive Framework and Self Care  Participation Level: Minimal  Description of Group:  Patients first processed thoughts and feelings about up coming discharge. These included fears of upcoming changes, lack of change, new living environments, judgements and expectations from others and overall stigma of MH issues. We then discussed what is a supportive framework? What does it look like feel like and how do I discern it from and unhealthy non-supportive network? Learn how to cope when supports are not helpful and don't support you. Discussion followed on self care.   Therapeutic Goals Addressed in Processing Group:  1. Patient will identify one healthy supportive network that they can use at discharge. 2. Patient will identify one factor of a supportive framework and how to tell it from an unhealthy network. 3. Patient able to identify one coping skill to use when they do not have positive supports from others. 4. Patient will demonstrate ability to communicate their needs through discussion and/or role plays.  Summary of Patient Progress:  Pt entered at mid point of group session. As patients processed their anxiety about discharge and described healthy supports patient appeared distant as evidenced by body language and no eye contact. When asked direct question patient had no response and stated she felt hop[eless. Others offered encouragement.    Carney Bernatherine C Patsie Mccardle, LCSW

## 2016-10-11 NOTE — [facility] Suicide Risk Assessment (Signed)
[facility] INPATIENT:  Family/Significant Other Suicide Prevention Education  Suicide Prevention Education:  Family/Significant Other Refusal to Support Patient after Discharge:  Suicide Prevention Education Not Provided:  Patient has identified home of family/significant other as the place the patient will be residing after discharge.  With written consent of the patient, two attempts were made to provide Suicide Prevention Education to Alicia Porter  [phone].  This person indicates she cannot be responsible for the patient after discharge. Request made on weekend handoff to get consent for mom.   Modena MorrowCatherine C Harrill 10/11/2016,5:46 PM

## 2016-10-11 NOTE — Plan of Care (Signed)
Problem: Medication: Goal: Compliance with prescribed medication regimen will improve Outcome: Progressing Patient is complaint with scheduled medication.

## 2016-10-11 NOTE — Progress Notes (Signed)
Writer spoke with patient at medication window. She has been isolative to her room other than walking her mother up to the door and attending group tonight. She requested her medications which she received. She remains guarded and somewhat paranoid and suspicious. She has little to say and limits conversation. Support given and safety maintained on unit with 15 min checks.

## 2016-10-11 NOTE — Progress Notes (Addendum)
D: Pt presents with pinched affect and depressed mood. Endorse passive SI without a plan. Denies HI, AVH and pain when assessed. Remains paranoid, appears preoccupied on interactions. C/O of insomnia this shift. Believes "staff are laughing at me". Isolative to her room majority of the morning.  A: Emotional support and availability offered to pt. Encouraged pt to voice concerns and remains compliance with therapy including unit groups. Scheduled and PRN medications administered as prescribed and effects monitored. Q 15 minutes safety checks maintained without self harm gestures or outburst to note at this time.  R: Pt took her medications without issues. Denies adverse drug reactions at present. Appears less paranoid this evening in comparison to the beginning of this shift. Pt was able to nap post Seroquel administration. Off unit for meals, tolerates all PO intake well. POC continues for safety and mood stability.

## 2016-10-11 NOTE — Plan of Care (Signed)
Problem: Safety: Goal: Periods of time without injury will increase Outcome: Progressing Routine safety checks continues without self harm gestures or outburst to note at this time.   Problem: Medication: Goal: Compliance with prescribed medication regimen will improve Outcome: Progressing Pt is medications compliant. Denies adverse drug reactions when assessed.

## 2016-10-11 NOTE — Progress Notes (Signed)
[facility]  10/11/2016 2:54 PM Deneise LeverSharon A Porter  MRN:  [id number]   Subjective:  Alicia Porter DecemberSharon seen resting in bed, tearful, reports racing thought and paranoia. reports " I know the nursing are talking about me."  Patient continues to endorses suicidal ideation, however she is able to contract for safety. Patient reports " I cant remember what happened at my job". Patient reports her depression is 8/10. States she is taken the medications as prescribed and states she is tolerating medication well. Patient present disheveled and mild thought blocking during this assessment. MHT has encouraged patient to attend all group sessions on today. Support, encouragement and reassurance was provide.  Principal Problem: Major depressive disorder, recurrent episode, severe, with psychotic behavior (HCC) Diagnosis:   Patient Active Problem List   Diagnosis Date Noted  . Alcohol use disorder (HCC) [F10.99] 10/10/2016  . Major depressive disorder, recurrent episode, severe, with psychosis (HCC) [F33.3] 10/09/2016  . Alcohol dependence with withdrawal, uncomplicated (HCC) [F10.230] 10/09/2016  . Major depressive disorder, recurrent episode, severe, with psychotic behavior (HCC) [F33.3] 10/09/2016  . Obstructive sleep apnea of adult [G47.33] 04/13/2014  . Sleep onset Insomnia [G47.00] 04/13/2014  . Persistent disorder of initiating or maintaining sleep [G47.00] 04/13/2014  . Parasomnia [G47.50] 04/13/2014  . Restless leg syndrome [G25.81] 04/13/2014  . CPAP (continuous positive airway pressure) dependence [Z99.89] 04/13/2014  . Bruxism [F45.8] 04/13/2014  . PCOS (polycystic ovarian syndrome) [E28.2] 12/09/2012  . Encounter for long-term (current) use of other medications [Z79.899] 12/09/2012  . Tobacco use disorder [F17.200] 12/09/2012  . Type II or unspecified type diabetes mellitus without mention of complication, not stated as uncontrolled [E11.9] 12/09/2012  . Major depressive disorder, recurrent  (HCC) [F33.9] 01/24/2011  . Generalized anxiety disorder [F41.1] 01/24/2011  . Borderline personality disorder [F60.3] 01/24/2011   Total Time spent with patient: 30 minutes  Past Psychiatric History:   Past Medical History:  Past Medical History:  Diagnosis Date  . Depression   . Diabetes mellitus without complication (HCC)   . Hyperlipidemia   . Polycystic ovarian disease   . Polycystic ovarian disease 09/08/2012   takes Aldactone for it  . Sleep apnea    History reviewed. No pertinent surgical history. Family History:  Family History  Problem Relation Age of Onset  . Heart disease Mother   . Diabetes Mother   . Hyperlipidemia Mother    Family Psychiatric  History:  Social History:  History  Alcohol Use  . Yes    Comment: last drink 09/29/16     History  Drug Use No    Social History   Social History  . Marital status: Single    Spouse name: N/A  . Number of children: N/A  . Years of education: N/A   Social History Main Topics  . Smoking status: Current Every Day Smoker    Packs/day: 1.00  . Smokeless tobacco: Never Used  . Alcohol use Yes     Comment: last drink 09/29/16  . Drug use: No  . Sexual activity: Yes   Other Topics Concern  . None   Social History Narrative  . None   Additional Social History:                         Sleep: Fair  Appetite:  Poor  Current Medications: Current Facility-Administered Medications  Medication Dose Route Frequency Provider Last Rate Last Dose  . acetaminophen (TYLENOL) tablet 650 mg  650 mg Oral Q4H PRN  Charm Rings, NP      . alum & mag hydroxide-simeth (MAALOX/MYLANTA) 200-200-20 MG/5ML suspension 30 mL  30 mL Oral Q6H PRN Charm Rings, NP      . citalopram (CELEXA) tablet 20 mg  20 mg Oral Daily Charm Rings, NP   20 mg at 10/11/16 4098  . haloperidol (HALDOL) tablet 0.5 mg  0.5 mg Oral BID Charm Rings, NP   0.5 mg at 10/11/16 1191  . hydrOXYzine (ATARAX/VISTARIL) tablet 25 mg  25  mg Oral TID PRN Charm Rings, NP   25 mg at 10/11/16 1018  . magnesium hydroxide (MILK OF MAGNESIA) suspension 30 mL  30 mL Oral Daily PRN Charm Rings, NP      . naltrexone (DEPADE) tablet 50 mg  50 mg Oral Daily Izediuno, Vincent A, MD   50 mg at 10/11/16 0823  . nicotine (NICODERM CQ - dosed in mg/24 hours) patch 21 mg  21 mg Transdermal Daily Charm Rings, NP   21 mg at 10/11/16 0827  . ondansetron (ZOFRAN) tablet 4 mg  4 mg Oral Q8H PRN Charm Rings, NP      . traZODone (DESYREL) tablet 50 mg  50 mg Oral QHS,MR X 1 Nira Conn A, NP   50 mg at 10/10/16 2301    Lab Results: No results found for this or any previous visit (from the past 48 hour(s)).  Blood Alcohol level:  Lab Results  Component Value Date   ETH <5 10/08/2016   ETH 126 (H) 10/04/2013    Metabolic Disorder Labs: Lab Results  Component Value Date   HGBA1C 5.2 08/08/2013   No results found for: PROLACTIN Lab Results  Component Value Date   CHOL 81 08/08/2013   TRIG 67 08/08/2013   HDL 29 (L) 08/08/2013   CHOLHDL 2.8 08/08/2013   VLDL 13 08/08/2013   LDLCALC 39 08/08/2013   LDLCALC 50 04/11/2013    Physical Findings: AIMS: Facial and Oral Movements Muscles of Facial Expression: None, normal Lips and Perioral Area: None, normal Jaw: None, normal Tongue: None, normal,Extremity Movements Upper (arms, wrists, hands, fingers): None, normal Lower (legs, knees, ankles, toes): None, normal, Trunk Movements Neck, shoulders, hips: None, normal, Overall Severity Severity of abnormal movements (highest score from questions above): None, normal Incapacitation due to abnormal movements: None, normal Patient's awareness of abnormal movements (rate only patient's report): No Awareness, Dental Status Current problems with teeth and/or dentures?: No Does patient usually wear dentures?: No  CIWA:  CIWA-Ar Total: 3 COWS:     Musculoskeletal: Strength & Muscle Tone: within normal limits Gait & Station:  normal Patient leans: N/A  Psychiatric Specialty Exam: Physical Exam  Vitals reviewed. Constitutional: She is oriented to person, place, and time.  Cardiovascular: Normal rate.   Neurological: She is alert and oriented to person, place, and time.  Psychiatric: She has a normal mood and affect.    Review of Systems  Psychiatric/Behavioral: Positive for depression and suicidal ideas. The patient has insomnia.     Blood pressure 115/88, pulse 96, temperature 98.8 F (37.1 C), temperature source Oral, resp. rate 18, height 5\' 6"  (1.676 m), weight 101.6 kg (224 lb).Body mass index is 36.15 kg/m.  General Appearance: Disheveled  Eye Contact:  Poor  Speech:  Clear and Coherent, Pressured and Slow  Volume:  Decreased  Mood:  Anxious, Depressed and Dysphoric  Affect:  Depressed and Flat  Thought Process:  Coherent  Orientation:  Full (Time, Place,  and Person)  Thought Content:  Paranoid Ideation and Rumination  Suicidal Thoughts:  Yes.  with intent/plan  Homicidal Thoughts:  No  Memory:  Immediate;   Fair Recent;   Fair Remote;   Fair  Judgement:  Impaired  Insight:  Lacking  Psychomotor Activity:  Restlessness  Concentration:  Concentration: Poor  Recall:  Poor  Fund of Knowledge:  Poor  Language:  Fair  Akathisia:  No  Handed:  Right  AIMS (if indicated):     Assets:  Desire for Improvement Physical Health Resilience Social Support  ADL's:  Intact  Cognition:  WNL  Sleep:  Number of Hours: 4.25    I agree with current treatment plan on 10/11/2016, Patient seen face-to-face for psychiatric evaluation follow-up, chart reviewed. Reviewed the information documented and agree with the treatment plan.  Treatment Plan Summary: Daily contact with patient to assess and evaluate symptoms and progress in treatment and Medication management   Increased Haldol 0.5mg  to 1mg  PO BID for mood stabilization. Continue with Trazodone 50 mg, Celexa 20mg   for insomnia and mood  stabilization  Will continue to monitor vitals ,medication compliance and treatment side effects while patient is here.  Reviewed labs: Order 10/11/2016 Prolactin, A1C, EKG, TSH and Lipid Panel ,BAL -, UDS -  CSW will start working on disposition.  Patient to participate in therapeutic milieu  Oneta Rack, NP 10/11/2016, 2:54 PM

## 2016-10-11 NOTE — Progress Notes (Signed)
[facility] Group Notes:  (Nursing/MHT/Case Management/Adjunct)  Date:  10/11/2016  Time:  10:37 PM  Type of Therapy:  Psychoeducational Skills  Participation Level:  Active  Participation Quality:  Attentive  Affect:  Appropriate  Cognitive:  Appropriate  Insight:  Good  Engagement in Group:  Developing/Improving  Modes of Intervention:  Education  Summary of Progress/Problems: The patient had little to share in group except that she had a bad morning but felt better in the afternoon. She also verbalized that the visit with her mother went well. In terms of the theme for the day, her support system will consist of her parents and ex-lover.   Hazle CocaGOODMAN, Siyona Coto S 10/11/2016, 10:37 PM

## 2016-10-12 DIAGNOSIS — F39 Unspecified mood [affective] disorder: Secondary | ICD-10-CM

## 2016-10-12 DIAGNOSIS — F419 Anxiety disorder, unspecified: Secondary | ICD-10-CM

## 2016-10-12 DIAGNOSIS — G4700 Insomnia, unspecified: Secondary | ICD-10-CM

## 2016-10-12 LAB — TSH: TSH: 2.049 u[IU]/mL (ref 0.350–4.500)

## 2016-10-12 LAB — LIPID PANEL
CHOL/HDL RATIO: 5.7 ratio
Cholesterol: 149 mg/dL (ref 0–200)
HDL: 26 mg/dL — AB (ref >40)
LDL CALC: 96 mg/dL (ref 0–99)
Triglycerides: 135 mg/dL (ref <150)
VLDL: 27 mg/dL (ref 0–40)

## 2016-10-12 LAB — HEMOGLOBIN A1C
HEMOGLOBIN A1C: 5.6 % (ref 4.8–5.6)
Mean Plasma Glucose: 114.02 mg/dL

## 2016-10-12 MED ORDER — TRAZODONE HCL 50 MG PO TABS
50.0000 mg | ORAL_TABLET | Freq: Every day | ORAL | Status: DC
  Administered 2016-10-12 – 2016-10-14 (×3): 50 mg via ORAL
  Filled 2016-10-12 (×4): qty 1

## 2016-10-12 MED ORDER — GABAPENTIN 100 MG PO CAPS
100.0000 mg | ORAL_CAPSULE | Freq: Two times a day (BID) | ORAL | Status: DC
  Administered 2016-10-12 – 2016-10-14 (×4): 100 mg via ORAL
  Filled 2016-10-12 (×7): qty 1

## 2016-10-12 MED ORDER — GABAPENTIN 300 MG PO CAPS
600.0000 mg | ORAL_CAPSULE | Freq: Every day | ORAL | Status: DC
  Administered 2016-10-12 – 2016-10-17 (×6): 600 mg via ORAL
  Filled 2016-10-12 (×8): qty 2

## 2016-10-12 NOTE — Plan of Care (Addendum)
Problem: Safety: Goal: Ability to remain free from injury will improve Outcome: Progressing Pt endorses passive suicidal thoughts with no plan or intent. Pt verbally contracts for safety. No self harm behaviors observed.

## 2016-10-12 NOTE — Progress Notes (Signed)
[facility] MD Progress Note  10/12/2016 12:30 PM Alicia Porter  MRN:  [id number]   Subjective:  Patient reports that she is still very paranoid and depressed. She denies any SI/HI/AVH today. 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 She denies any direct neglect or medication abuse or misuse, but feel she may have messed up some assessments. Patients reports a long histroy of medications to include Lexapro, Lamictal, Lithium, Abilify, Saphris, Ativan, Klonopin, Adderall, and Seroquel, but doesn't remember any working better than the other. However, she states that she continued to drink alcohol with all her medications.   Objective: Patient is cooperative, but appears depressed and has a flat affect. She continues to complain about sleep and her medications changed. Will continue her Haldol 1 mg PO BID and will add Gabapentin 100 mg PO BID and 600 mg QHS after discussing with Dr. Jackquline Berlin.    Principal Problem: Major depressive disorder, recurrent episode, severe, with psychotic behavior (HCC) Diagnosis:   Patient Active Problem List   Diagnosis Date Noted  . Alcohol use disorder (HCC) [F10.99] 10/10/2016  . Major depressive disorder, recurrent episode, severe, with psychosis (HCC) [F33.3] 10/09/2016  . Alcohol dependence with withdrawal, uncomplicated (HCC) [F10.230] 10/09/2016  . Major depressive disorder, recurrent episode, severe, with psychotic behavior (HCC) [F33.3] 10/09/2016  . Obstructive sleep apnea of adult [G47.33] 04/13/2014  . Sleep onset Insomnia [G47.00] 04/13/2014  . Persistent disorder of initiating or maintaining sleep [G47.00] 04/13/2014  . Parasomnia [G47.50] 04/13/2014  . Restless leg syndrome [G25.81] 04/13/2014  . CPAP (continuous positive airway pressure) dependence  [Z99.89] 04/13/2014  . Bruxism [F45.8] 04/13/2014  . PCOS (polycystic ovarian syndrome) [E28.2] 12/09/2012  . Encounter for long-term (current) use of other medications [Z79.899] 12/09/2012  . Tobacco use disorder [F17.200] 12/09/2012  . Type II or unspecified type diabetes mellitus without mention of complication, not stated as uncontrolled [E11.9] 12/09/2012  . Major depressive disorder, recurrent (HCC) [F33.9] 01/24/2011  . Generalized anxiety disorder [F41.1] 01/24/2011  . Borderline personality disorder [F60.3] 01/24/2011   Total Time spent with patient: 25 minutes  Past Psychiatric History: See H&P  Past Medical History:  Past Medical History:  Diagnosis Date  . Depression   . Diabetes mellitus without complication (HCC)   . Hyperlipidemia   . Polycystic ovarian disease   . Polycystic ovarian disease 09/08/2012   takes Aldactone for it  . Sleep apnea    History reviewed. No pertinent surgical history. Family History:  Family History  Problem Relation Age of Onset  . Heart disease Mother   . Diabetes Mother   . Hyperlipidemia Mother    Family Psychiatric  History: See H&P Social History:  History  Alcohol Use  . Yes    Comment: last drink 09/29/16     History  Drug Use No    Social History   Social History  . Marital status: Single    Spouse name: N/A  . Number of children: N/A  . Years of education: N/A   Social History Main Topics  . Smoking status: Current Every Day Smoker    Packs/day: 1.00  . Smokeless tobacco: Never Used  . Alcohol use Yes     Comment: last drink 09/29/16  . Drug use: No  . Sexual activity: Yes  Other Topics Concern  . None   Social History Narrative  . None   Additional Social History:                         Sleep: Fair  Appetite:  Fair  Current Medications: Current Facility-Administered Medications  Medication Dose Route Frequency Provider Last Rate Last Dose  . acetaminophen (TYLENOL) tablet 650 mg  650  mg Oral Q4H PRN Charm RingsLord, Jamison Y, NP      . alum & mag hydroxide-simeth (MAALOX/MYLANTA) 200-200-20 MG/5ML suspension 30 mL  30 mL Oral Q6H PRN Charm RingsLord, Jamison Y, NP      . citalopram (CELEXA) tablet 20 mg  20 mg Oral Daily Charm RingsLord, Jamison Y, NP   20 mg at 10/12/16 0830  . gabapentin (NEURONTIN) capsule 100 mg  100 mg Oral BID Kiing Deakin, Feliz Beamravis B, FNP      . gabapentin (NEURONTIN) capsule 600 mg  600 mg Oral QHS Ahtziry Saathoff B, FNP      . haloperidol (HALDOL) tablet 1 mg  1 mg Oral BID Oneta RackLewis, Tanika N, NP   1 mg at 10/12/16 0830  . hydrOXYzine (ATARAX/VISTARIL) tablet 25 mg  25 mg Oral TID PRN Charm RingsLord, Jamison Y, NP   25 mg at 10/12/16 0957  . magnesium hydroxide (MILK OF MAGNESIA) suspension 30 mL  30 mL Oral Daily PRN Charm RingsLord, Jamison Y, NP      . naltrexone (DEPADE) tablet 50 mg  50 mg Oral Daily Izediuno, Delight OvensVincent A, MD   50 mg at 10/12/16 0830  . nicotine (NICODERM CQ - dosed in mg/24 hours) patch 21 mg  21 mg Transdermal Daily Charm RingsLord, Jamison Y, NP   21 mg at 10/12/16 0830  . ondansetron (ZOFRAN) tablet 4 mg  4 mg Oral Q8H PRN Charm RingsLord, Jamison Y, NP      . traZODone (DESYREL) tablet 50 mg  50 mg Oral QHS Erielle Gawronski, Gerlene Burdockravis B, FNP        Lab Results:  Results for orders placed or performed during the hospital encounter of 10/09/16 (from the past 48 hour(s))  Lipid panel     Status: Abnormal   Collection Time: 10/12/16  6:09 AM  Result Value Ref Range   Cholesterol 149 0 - 200 mg/dL   Triglycerides 161135 <096<150 mg/dL   HDL 26 (L) >04>40 mg/dL   Total CHOL/HDL Ratio 5.7 RATIO   VLDL 27 0 - 40 mg/dL   LDL Cholesterol 96 0 - 99 mg/dL    Comment:        Total Cholesterol/HDL:CHD Risk Coronary Heart Disease Risk Table                     Men   Women  1/2 Average Risk   3.4   3.3  Average Risk       5.0   4.4  2 X Average Risk   9.6   7.1  3 X Average Risk  23.4   11.0        Use the calculated Patient Ratio above and the CHD Risk Table to determine the patient's CHD Risk.        ATP III CLASSIFICATION (LDL):   <100     mg/dL   Optimal  [id number]  mg/dL   Near or Above                    Optimal  130-159  mg/dL   Borderline  160-189  mg/dL   High  >161     mg/dL   Very High Performed at [facility], [address], [place], [place] [address]   Hemoglobin A1c     Status: None   Collection Time: 10/12/16  6:09 AM  Result Value Ref Range   Hgb A1c MFr Bld 5.6 4.8 - 5.6 %    Comment: (NOTE) Pre diabetes:          5.7%-6.4% Diabetes:              >6.4% Glycemic control for   <7.0% adults with diabetes    Mean Plasma Glucose 114.02 mg/dL    Comment: Performed at [facility], [address], [place], [place] [address]  TSH     Status: None   Collection Time: 10/12/16  6:09 AM  Result Value Ref Range   TSH 2.049 0.350 - 4.500 uIU/mL    Comment: Performed by a 3rd Generation assay with a functional sensitivity of <=0.01 uIU/mL. Performed at [facility], [address] [place], [place] [address]     Blood Alcohol level:  Lab Results  Component Value Date   ETH <5 10/08/2016   ETH 126 (H) 10/04/2013    Metabolic Disorder Labs: Lab Results  Component Value Date   HGBA1C 5.6 10/12/2016   MPG 114.02 10/12/2016   No results found for: PROLACTIN Lab Results  Component Value Date   CHOL 149 10/12/2016   TRIG 135 10/12/2016   HDL 26 (L) 10/12/2016   CHOLHDL 5.7 10/12/2016   VLDL 27 10/12/2016   LDLCALC 96 10/12/2016   LDLCALC 39 08/08/2013    Physical Findings: AIMS: Facial and Oral Movements Muscles of Facial Expression: None, normal Lips and Perioral Area: None, normal Jaw: None, normal Tongue: None, normal,Extremity Movements Upper (arms, wrists, hands, fingers): None, normal Lower (legs, knees, ankles, toes): None, normal, Trunk Movements Neck, shoulders, hips: None, normal, Overall Severity Severity of abnormal movements (highest score from questions above): None, normal Incapacitation due to abnormal movements: None, normal Patient's  awareness of abnormal movements (rate only patient's report): No Awareness, Dental Status Current problems with teeth and/or dentures?: No Does patient usually wear dentures?: No  CIWA:  CIWA-Ar Total: 3 COWS:     Musculoskeletal: Strength & Muscle Tone: within normal limits Gait & Station: normal Patient leans: N/A  Psychiatric Specialty Exam: Physical Exam  Nursing note and vitals reviewed. Constitutional: She is oriented to person, place, and time. She appears well-developed and well-nourished.  Cardiovascular: Normal rate.   Respiratory: Effort normal.  Musculoskeletal: Normal range of motion.  Neurological: She is alert and oriented to person, place, and time.  Skin: Skin is warm.    Review of Systems  Constitutional: Negative.   HENT: Negative.   Eyes: Negative.   Respiratory: Negative.   Cardiovascular: Negative.   Gastrointestinal: Negative.   Genitourinary: Negative.   Musculoskeletal: Negative.   Skin: Negative.   Neurological: Negative.   Endo/Heme/Allergies: Negative.   Psychiatric/Behavioral: Positive for depression. Negative for hallucinations. The patient is nervous/anxious and has insomnia.     Blood pressure 104/71, pulse 92, temperature 98.5 F (36.9 C), temperature source Oral, resp. rate 16, height 5\' 6"  (1.676 m), weight 101.6 kg (224 lb).Body mass index is 36.15 kg/m.  General Appearance: Disheveled  Eye Contact:  Fair  Speech:  Clear and Coherent and Normal Rate  Volume:  Decreased  Mood:  Depressed  Affect:  Depressed and Flat  Thought  Process:  Coherent and Descriptions of Associations: Intact  Orientation:  Full (Time, Place, and Person)  Thought Content:  Paranoid Ideation  Suicidal Thoughts:  No  Homicidal Thoughts:  No  Memory:  Immediate;   Good Recent;   Good  Judgement:  Fair  Insight:  Lacking  Psychomotor Activity:  Normal  Concentration:  Concentration: Good and Attention Span: Good  Recall:  Good  Fund of Knowledge:  Good   Language:  Good  Akathisia:  No  Handed:  Right  AIMS (if indicated):     Assets:  Financial Resources/Insurance Housing Social Support Transportation  ADL's:  Intact  Cognition:  WNL  Sleep:  Number of Hours: 6     Treatment Plan Summary: Daily contact with patient to assess and evaluate symptoms and progress in treatment, Medication management and Plan is to:  -Start Neurontin 100 mg PO BID and 600 mg PO QHS for mood stability and sleep -Change Trazodone 50 mg PO QHS for sleep -Continue Haldol 1mg  PO BID for mood stability -Continue Celexa 20 mg PO Daily for mood stability -Continue Vistaril 25 mg PO TID PRN for anxiety -Encourage group therapy participation   Maryfrances Bunnell, FNP 10/12/2016, 12:30 PM

## 2016-10-12 NOTE — Progress Notes (Signed)
Recreation Therapy Notes  Date: 10/12/16 Time: 0930 Location: 300 [address] Group Room  Group Topic: Stress Management  Goal Area(s) Addresses:  Patient will verbalize importance of using healthy stress management.  Patient will identify positive emotions associated with healthy stress management.   Behavioral Response: Engaged   Intervention: Stress Management  Activity :  Meditation.  LRT introduced the stress management technique of meditation to patients.  LRT played Porter meditation from the Calm app that focused on humanity.  Patients were to follow along as the meditation played to fully engage in the meditation.  Education:  Stress Management, Discharge Planning.   Education Outcome: Acknowledges edcuation/In group clarification offered/Needs additional education  Clinical Observations/Feedback: Pt attended group.    Alicia Porter, LRT/CTRS         Alicia Porter 10/12/2016 12:19 PM 

## 2016-10-12 NOTE — [facility] Group Notes (Signed)
LCSW Group Therapy Note   10/12/2016 10:00am   Type of Therapy and Topic:  Group Therapy:  Overcoming Obstacles   Participation Level:  None   Description of Group:    In this group patients will be encouraged to explore what they see as obstacles to their own wellness and recovery. They will be guided to discuss their thoughts, feelings, and behaviors related to these obstacles. The group will process together ways to cope with barriers, with attention given to specific choices patients can make. Each patient will be challenged to identify changes they are motivated to make in order to overcome their obstacles. This group will be process-oriented, with patients participating in exploration of their own experiences as well as giving and receiving support and challenge from other group members.   Therapeutic Goals: 1. Patient will identify personal and current obstacles as they relate to admission. 2. Patient will identify barriers that currently interfere with their wellness or overcoming obstacles.  3. Patient will identify feelings, thought process and behaviors related to these barriers. 4. Patient will identify two changes they are willing to make to overcome these obstacles:      Summary of Patient Progress Pt declined to participate even when prompted; Pt appeared withdrawn and presented with a flat affect.      Therapeutic Modalities:   Cognitive Behavioral Therapy Solution Focused Therapy Motivational Interviewing Relapse Prevention Therapy  Verdene LennertLauren C Equilla Que, LCSW 10/12/2016 9:47 AM

## 2016-10-12 NOTE — Tx Team (Signed)
Interdisciplinary Treatment and Diagnostic Plan Update  10/12/2016 Time of Session: 9:30am Alicia Porter MRN: [id number]  Principal Diagnosis: Major depressive disorder, recurrent episode, severe, with psychotic behavior ([place])  Secondary Diagnoses: Principal Problem:   Major depressive disorder, recurrent episode, severe, with psychotic behavior ([place]) Active Problems:   Alcohol use disorder ([place])   Current Medications:  Current Facility-Administered Medications  Medication Dose Route Frequency Provider Last Rate Last Dose  . acetaminophen (TYLENOL) tablet 650 mg  650 mg Oral Q4H PRN Patrecia Pour, NP      . alum & mag hydroxide-simeth (MAALOX/MYLANTA) 200-200-20 MG/5ML suspension 30 mL  30 mL Oral Q6H PRN Patrecia Pour, NP      . citalopram (CELEXA) tablet 20 mg  20 mg Oral Daily Patrecia Pour, NP   20 mg at 10/12/16 0830  . haloperidol (HALDOL) tablet 1 mg  1 mg Oral BID Derrill Center, NP   1 mg at 10/12/16 0830  . hydrOXYzine (ATARAX/VISTARIL) tablet 25 mg  25 mg Oral TID PRN Patrecia Pour, NP   25 mg at 10/11/16 2106  . magnesium hydroxide (MILK OF MAGNESIA) suspension 30 mL  30 mL Oral Daily PRN Patrecia Pour, NP      . naltrexone (DEPADE) tablet 50 mg  50 mg Oral Daily Izediuno, Laruth Bouchard, MD   50 mg at 10/12/16 0830  . nicotine (NICODERM CQ - dosed in mg/24 hours) patch 21 mg  21 mg Transdermal Daily Patrecia Pour, NP   21 mg at 10/12/16 0830  . ondansetron (ZOFRAN) tablet 4 mg  4 mg Oral Q8H PRN Patrecia Pour, NP      . traZODone (DESYREL) tablet 50 mg  50 mg Oral QHS,MR X 1 Lindon Romp A, NP   50 mg at 10/11/16 2106    PTA Medications: Prescriptions Prior to Admission  Medication Sig Dispense Refill Last Dose  . atorvastatin (LIPITOR) 40 MG tablet TAKE 1 TABLET BY MOUTH AT BEDTIME.  "OV NEEDED" (Patient not taking: Reported on 10/08/2016) 30 tablet 0 Completed Course at Unknown time  . eletriptan (RELPAX) 40 MG tablet Take 1 tablet (40 mg total) by mouth as  needed for migraine or headache. One tablet by mouth at onset of headache. May repeat in 2 hours if headache persists or recurs. (Patient not taking: Reported on 10/08/2016) 10 tablet 12 Completed Course at Unknown time  . hydrOXYzine (ATARAX/VISTARIL) 25 MG tablet Take 25 mg by mouth 3 (three) times daily as needed for anxiety.   10/08/2016 at Unknown time  . meloxicam (MOBIC) 15 MG tablet Take 1 tablet (15 mg total) by mouth daily. (Patient not taking: Reported on 10/08/2016) 30 tablet 5 Completed Course at Unknown time  . metFORMIN (GLUCOPHAGE-XR) 500 MG 24 hr tablet TAKE 4 TABLETS BY MOUTH DAILY "NO MORE REFILLS WITHOUT OFFICE VISIT) 3RD (Patient not taking: Reported on 10/08/2016) 60 tablet 0 Completed Course at Unknown time  . spironolactone (ALDACTONE) 50 MG tablet Take 1 tablet (50 mg total) by mouth daily after breakfast. (Patient not taking: Reported on 10/08/2016) 90 tablet 3 Completed Course at Unknown time    Treatment Modalities: Medication Management, Group therapy, Case management,  1 to 1 session with clinician, Psychoeducation, Recreational therapy.  Patient Stressors: Legal issue Medication change or noncompliance Substance abuse Other: loss of nursing license  Patient Strengths: Ability for insight Average or above average intelligence Capable of independent living General fund of knowledge Motivation for treatment/growth Supportive family/friends  Physician  Treatment Plan for Primary Diagnosis: Major depressive disorder, recurrent episode, severe, with psychotic behavior ([place]) Long Term Goal(s): Improvement in symptoms so as ready for discharge  Short Term Goals: Ability to identify changes in lifestyle to reduce recurrence of condition will improve Ability to verbalize feelings will improve Ability to demonstrate self-control will improve Ability to identify triggers associated with substance abuse/mental health issues will improve Ability to identify changes in  lifestyle to reduce recurrence of condition will improve Ability to verbalize feelings will improve Compliance with prescribed medications will improve Ability to identify triggers associated with substance abuse/mental health issues will improve  Medication Management: Evaluate patient's response, side effects, and tolerance of medication regimen.  Therapeutic Interventions: 1 to 1 sessions, Unit Group sessions and Medication administration.  Evaluation of Outcomes: Not Met  Physician Treatment Plan for Secondary Diagnosis: Principal Problem:   Major depressive disorder, recurrent episode, severe, with psychotic behavior ([place]) Active Problems:   Alcohol use disorder ([place])   Long Term Goal(s): Improvement in symptoms so as ready for discharge  Short Term Goals: Ability to identify changes in lifestyle to reduce recurrence of condition will improve Ability to verbalize feelings will improve Ability to demonstrate self-control will improve Ability to identify triggers associated with substance abuse/mental health issues will improve Ability to identify changes in lifestyle to reduce recurrence of condition will improve Ability to verbalize feelings will improve Compliance with prescribed medications will improve Ability to identify triggers associated with substance abuse/mental health issues will improve  Medication Management: Evaluate patient's response, side effects, and tolerance of medication regimen.  Therapeutic Interventions: 1 to 1 sessions, Unit Group sessions and Medication administration.  Evaluation of Outcomes: Not Met   RN Treatment Plan for Primary Diagnosis: Major depressive disorder, recurrent episode, severe, with psychotic behavior ([place]) Long Term Goal(s): Knowledge of disease and therapeutic regimen to maintain health will improve  Short Term Goals: Ability to verbalize feelings will improve, Ability to disclose and discuss suicidal ideas and Ability to  identify and develop effective coping behaviors will improve  Medication Management: RN will administer medications as ordered by provider, will assess and evaluate patient's response and provide education to patient for prescribed medication. RN will report any adverse and/or side effects to prescribing provider.  Therapeutic Interventions: 1 on 1 counseling sessions, Psychoeducation, Medication administration, Evaluate responses to treatment, Monitor vital signs and CBGs as ordered, Perform/monitor CIWA, COWS, AIMS and Fall Risk screenings as ordered, Perform wound care treatments as ordered.  Evaluation of Outcomes: Not Met   LCSW Treatment Plan for Primary Diagnosis: Major depressive disorder, recurrent episode, severe, with psychotic behavior ([place]) Long Term Goal(s): Safe transition to appropriate next level of care at discharge, Engage patient in therapeutic group addressing interpersonal concerns.  Short Term Goals: Engage patient in aftercare planning with referrals and resources, Identify triggers associated with mental health/substance abuse issues and Increase skills for wellness and recovery  Therapeutic Interventions: Assess for all discharge needs, 1 to 1 time with Social worker, Explore available resources and support systems, Assess for adequacy in community support network, Educate family and significant other(s) on suicide prevention, Complete Psychosocial Assessment, Interpersonal group therapy.  Evaluation of Outcomes: Not Met   Progress in Treatment: Attending groups: Intermittently  Participating in groups: Intermittently  Taking medication as prescribed: Yes, MD continues to assess for medication changes as needed Toleration medication: Yes, no side effects reported at this time Family/Significant other contact made: No, CSW attempting to make contact with mother Patient understands diagnosis: Continuing to  assess Discussing patient identified problems/goals with  staff: Yes Medical problems stabilized or resolved: Yes Denies suicidal/homicidal ideation: No, endorses SI Issues/concerns per patient self-inventory: None Other: N/A  New problem(s) identified: None identified at this time.   New Short Term/Long Term Goal(s): None identified at this time.   Discharge Plan or Barriers: CSW will assess for appropriate discharge plan and relevant barriers.   Reason for Continuation of Hospitalization: Anxiety Depression Medication stabilization Suicidal ideation  Estimated Length of Stay: 3-5 days; est DC date 9/7  Attendees: Patient: Alicia Porter 10/12/2016  8:53 AM  Physician: Dr. Parke Poisson, MD 10/12/2016  8:53 AM  Nursing: , RN 10/12/2016  8:53 AM  RN Care Manager: Lars Pinks, RN 10/12/2016  8:53 AM  Social Worker: Adriana Reams, LCSW 10/12/2016  8:53 AM  Recreational Therapist:  10/12/2016  8:53 AM  Other: Lindell Spar, NP; Marvia Pickles, NP 10/12/2016  8:53 AM  Other:  10/12/2016  8:53 AM  Other: 10/12/2016  8:53 AM    Scribe for Treatment Team: Gladstone Lighter, LCSW 10/12/2016 8:53 AM

## 2016-10-12 NOTE — Progress Notes (Signed)
D: Pt presents with a sad affect and depressed mood. Pt appears to have some thought blocking and delayed responses with intense staring. Pt denies AVH but reports feeling paranoid. Pt endorses SI with no plan or intent. Pt verbally contracts for safety. Pt reports ongoing insomnia but stated that she was able to sleep a little longer last night before waking up. Pt stated that she continues to wake up a few times during the night. Pt rates depression 8/10. Anxiety 6/10. Pt continues to isolate in her room and appears withdrawn. No side effects to meds verbalized by pt. A: Medications reviewed with pt. mediations administered as ordered per MD. Verbal support provided. Pt encouraged to attend groups. 15 minute checks performed for safety.  R: Pt compliant with tx.

## 2016-10-13 LAB — PROLACTIN: Prolactin: 42.8 ng/mL — ABNORMAL HIGH (ref 4.8–23.3)

## 2016-10-13 NOTE — Progress Notes (Signed)
[facility] MD Progress Note  10/13/2016 8:58 AM Alicia Porter  MRN:  [id number]   Subjective:  Patient reports she is feeling better, less depressed, denies suicidal ideations. States she is concerned today because she feels that being on Haldol will make it impossible for her to get accepted into a rehab.   Objective : I have discussed case with treatment team and have met with patient. Patient reports she is significantly better than on admission, and reports that her mood is better, she is feeling more optimistic, states her sleep is better. Denies suicidal ideations. Today she is expressing anxiety about being on Haldol, and this precluding her being considered at [facility]. She states Haldol was started on admission and had never been on it before. It was started at 1 mgr BID due to vague paranoia. She states that these symptoms were related to alcohol withdrawal because during previous episodes of alcohol withdrawal she has also had some transient paranoia. She denies any hallucinations in the past, other than during a remote episode of DTs . Requests to stop Haldol.   Principal Problem: Major depressive disorder, recurrent episode, severe, with psychotic behavior ([place]) Diagnosis:   Patient Active Problem List   Diagnosis Date Noted  . Alcohol use disorder ([place]) [F10.99] 10/10/2016  . Major depressive disorder, recurrent episode, severe, with psychosis ([place]) [F33.3] 10/09/2016  . Alcohol dependence with withdrawal, uncomplicated ([place]) [O97.353] 10/09/2016  . Major depressive disorder, recurrent episode, severe, with psychotic behavior ([place]) [F33.3] 10/09/2016  . Obstructive sleep apnea of adult [G47.33] 04/13/2014  . Sleep onset Insomnia [G47.00] 04/13/2014  . Persistent disorder of initiating or maintaining sleep [G47.00] 04/13/2014  . Parasomnia [G47.50] 04/13/2014  . Restless leg syndrome [G25.81] 04/13/2014  . CPAP (continuous positive airway pressure) dependence [Z99.89]  04/13/2014  . Bruxism [F45.8] 04/13/2014  . PCOS (polycystic ovarian syndrome) [E28.2] 12/09/2012  . Encounter for long-term (current) use of other medications [Z79.899] 12/09/2012  . Tobacco use disorder [F17.200] 12/09/2012  . Type II or unspecified type diabetes mellitus without mention of complication, not stated as uncontrolled [E11.9] 12/09/2012  . Major depressive disorder, recurrent ([place]) [F33.9] 01/24/2011  . Generalized anxiety disorder [F41.1] 01/24/2011  . Borderline personality disorder [F60.3] 01/24/2011   Total Time spent with patient: 20 minutes  Past Psychiatric History:   Past Medical History:  Past Medical History:  Diagnosis Date  . Depression   . Diabetes mellitus without complication ([place])   . Hyperlipidemia   . Polycystic ovarian disease   . Polycystic ovarian disease 09/08/2012   takes Aldactone for it  . Sleep apnea    History reviewed. No pertinent surgical history. Family History:  Family History  Problem Relation Age of Onset  . Heart disease Mother   . Diabetes Mother   . Hyperlipidemia Mother    Family Psychiatric  History:  Social History:  History  Alcohol Use  . Yes    Comment: last drink 09/29/16     History  Drug Use No    Social History   Social History  . Marital status: Single    Spouse name: N/A  . Number of children: N/A  . Years of education: N/A   Social History Main Topics  . Smoking status: Current Every Day Smoker    Packs/day: 1.00  . Smokeless tobacco: Never Used  . Alcohol use Yes     Comment: last drink 09/29/16  . Drug use: No  . Sexual activity: Yes   Other Topics Concern  .  None   Social History Narrative  . None   Additional Social History:   Sleep: gradually improving   Appetite:  Improving   Current Medications: Current Facility-Administered Medications  Medication Dose Route Frequency Provider Last Rate Last Dose  . acetaminophen (TYLENOL) tablet 650 mg  650 mg Oral Q4H PRN Patrecia Pour,  NP      . alum & mag hydroxide-simeth (MAALOX/MYLANTA) 200-200-20 MG/5ML suspension 30 mL  30 mL Oral Q6H PRN Patrecia Pour, NP      . citalopram (CELEXA) tablet 20 mg  20 mg Oral Daily Patrecia Pour, NP   20 mg at 10/13/16 0818  . gabapentin (NEURONTIN) capsule 100 mg  100 mg Oral BID Money, Lowry Ram, FNP   100 mg at 10/13/16 0817  . gabapentin (NEURONTIN) capsule 600 mg  600 mg Oral QHS Money, Lowry Ram, FNP   600 mg at 10/12/16 2117  . haloperidol (HALDOL) tablet 1 mg  1 mg Oral BID Derrill Center, NP   1 mg at 10/13/16 0817  . hydrOXYzine (ATARAX/VISTARIL) tablet 25 mg  25 mg Oral TID PRN Patrecia Pour, NP   25 mg at 10/12/16 0957  . magnesium hydroxide (MILK OF MAGNESIA) suspension 30 mL  30 mL Oral Daily PRN Patrecia Pour, NP      . naltrexone (DEPADE) tablet 50 mg  50 mg Oral Daily Izediuno, Vincent A, MD   50 mg at 10/13/16 0817  . nicotine (NICODERM CQ - dosed in mg/24 hours) patch 21 mg  21 mg Transdermal Daily Patrecia Pour, NP   21 mg at 10/13/16 0816  . ondansetron (ZOFRAN) tablet 4 mg  4 mg Oral Q8H PRN Patrecia Pour, NP      . traZODone (DESYREL) tablet 50 mg  50 mg Oral QHS Money, Lowry Ram, FNP   50 mg at 10/12/16 2117    Lab Results:  Results for orders placed or performed during the hospital encounter of 10/09/16 (from the past 48 hour(s))  Lipid panel     Status: Abnormal   Collection Time: 10/12/16  6:09 AM  Result Value Ref Range   Cholesterol 149 0 - 200 mg/dL   Triglycerides 135 <150 mg/dL   HDL 26 (L) >40 mg/dL   Total CHOL/HDL Ratio 5.7 RATIO   VLDL 27 0 - 40 mg/dL   LDL Cholesterol 96 0 - 99 mg/dL    Comment:        Total Cholesterol/HDL:CHD Risk Coronary Heart Disease Risk Table                     Men   Women  1/2 Average Risk   3.4   3.3  Average Risk       5.0   4.4  2 X Average Risk   9.6   7.1  3 X Average Risk  23.4   11.0        Use the calculated Patient Ratio above and the CHD Risk Table to determine the patient's CHD Risk.         ATP III CLASSIFICATION (LDL):  <100     mg/dL   Optimal  100-129  mg/dL   Near or Above                    Optimal  130-159  mg/dL   Borderline  160-189  mg/dL   High  >190     mg/dL  Very High Performed at [facility], [place] [address], [place], [place] [address]   Hemoglobin A1c     Status: None   Collection Time: 10/12/16  6:09 AM  Result Value Ref Range   Hgb A1c MFr Bld 5.6 4.8 - 5.6 %    Comment: (NOTE) Pre diabetes:          5.7%-6.4% Diabetes:              >6.4% Glycemic control for   <7.0% adults with diabetes    Mean Plasma Glucose 114.02 mg/dL    Comment: Performed at [facility] [address], [place], [place] [address]  TSH     Status: None   Collection Time: 10/12/16  6:09 AM  Result Value Ref Range   TSH 2.049 0.350 - 4.500 uIU/mL    Comment: Performed by a 3rd Generation assay with a functional sensitivity of <=0.01 uIU/mL. Performed at [facility], [place] [address], [place], [place] [address]     Blood Alcohol level:  Lab Results  Component Value Date   ETH <5 10/08/2016   ETH 126 (H) 33/54/5625    Metabolic Disorder Labs: Lab Results  Component Value Date   HGBA1C 5.6 10/12/2016   MPG 114.02 10/12/2016   No results found for: PROLACTIN Lab Results  Component Value Date   CHOL 149 10/12/2016   TRIG 135 10/12/2016   HDL 26 (L) 10/12/2016   CHOLHDL 5.7 10/12/2016   VLDL 27 10/12/2016   LDLCALC 96 10/12/2016   LDLCALC 39 08/08/2013    Physical Findings: AIMS: Facial and Oral Movements Muscles of Facial Expression: None, normal Lips and Perioral Area: None, normal Jaw: None, normal Tongue: None, normal,Extremity Movements Upper (arms, wrists, hands, fingers): None, normal Lower (legs, knees, ankles, toes): None, normal, Trunk Movements Neck, shoulders, hips: None, normal, Overall Severity Severity of abnormal movements (highest score from questions above): None, normal Incapacitation due to abnormal  movements: None, normal Patient's awareness of abnormal movements (rate only patient's report): No Awareness, Dental Status Current problems with teeth and/or dentures?: No Does patient usually wear dentures?: No  CIWA:  CIWA-Ar Total: 3 COWS:     Musculoskeletal: Strength & Muscle Tone: within normal limits Gait & Station: normal Patient leans: N/A  Psychiatric Specialty Exam: Physical Exam  Vitals reviewed. Constitutional: She is oriented to person, place, and time.  Cardiovascular: Normal rate.   Neurological: She is alert and oriented to person, place, and time.  Psychiatric: She has a normal mood and affect.    Review of Systems  Psychiatric/Behavioral: Positive for depression and suicidal ideas. The patient has insomnia.   denies headache, no chest pain , no shortness of breath, no vomiting   Blood pressure 110/75, pulse 93, temperature 98.5 F (36.9 C), temperature source Oral, resp. rate 20, height _0  (1.676 m), weight 101.6 kg (224 lb).Body mass index is 36.15 kg/m.  General Appearance: improved grooming   Eye Contact:  Good  Speech:  Normal Rate  Volume:  Normal  Mood:  reports improving mood, and states mood today is " better, middle of the road"  Affect:  improved, vaguely anxious  Thought Process:  Linear and Descriptions of Associations: Intact  Orientation:  Full (Time, Place, and Person)  Thought Content:  denies any hallucinations, no delusions, not internally preoccupied, denies any paranoid or self referential ideations, and states " my paranoia is gone "  Suicidal Thoughts:  No denies suicidal ideations, denies self  injurious ideations   Homicidal Thoughts:  No  Memory:  Recent and remote grossly intact   Judgement:  Other:  improving   Insight:  improving   Psychomotor Activity:  Normal  Concentration:  Concentration: Good and Attention Span: Good  Recall:  Good  Fund of Knowledge:  Good  Language:  Good  Akathisia:  No  Handed:  Right  AIMS (if  indicated):     Assets:  Desire for Improvement Physical Health Resilience Social Support  ADL's:  Intact  Cognition:  WNL  Sleep:  Number of Hours: 6.75   Assessment - patient presents with improving mood and range of affect. At this time denies significant residual alcohol withdrawal , and does not appear to be in any acute distress or discomfort . Vitals stable. Patient currently reporting feeling better, denies SI, and is future oriented , looking forward to going to a rehab setting at discharge. She states she plans to go to a rehab but today is concerned that she will be unable to get into a rehab of her choice while on Haldol, and wants to stop this medication . She states transient vague paranoia has occurred during prior withdrawal syndromes as well, and is now resolved .  Treatment Plan Summary: Treatment plan reviewed today 9/4 Daily contact with patient to assess and evaluate symptoms and progress in treatment and Medication management  Encourage ongoing group and milieu participation to work on coping skills and symptom reduction Continue to encourage efforts to work on sobriety and relapse prevention Continue Celexa 20 mgrs QDAY for depression, anxiety Continue with Trazodone 50 mg QHS for insomnia as needed  Continue Neurontin  100 mgrs BID and 600 mgrs QHS for anxiety,pain Continue Naltrexone 50 mgrs QDAY to help with alcohol abstinence  Treatment team working on disposition planning as above - patient hopeful to be accepted to a rehab   Jenne Campus, MD 10/13/2016, 8:58 AM   Patient ID: Alicia Porter, female   DOB: 1971/01/25, 46 y.o.   MRN: [id number]

## 2016-10-13 NOTE — Progress Notes (Signed)
D: Pt presents flat, anxious, and paranoid. Pt reports passive SI thoughts. Pt reported that Haldol helped her.  A: Support and encouragement offered as well as 15 minute checks. R: Pt contracts with staff for safety maintained on the unit.

## 2016-10-13 NOTE — Progress Notes (Signed)
Recreation Therapy Notes  Animal-Assisted Activity (AAA) Program Checklist/Progress Notes Patient Eligibility Criteria Checklist & Daily Group note for Rec TxIntervention  Date: 09.04.2018 Time: 2:45pm [address]   AAA/T Program Assumption of Risk Form signed by Patient/ or Parent Legal Guardian Yes  Patient is free of allergies or sever asthma Yes  Patient reports no fear of animals Yes  Patient reports no history of cruelty to animals Yes  Patient understands his/her participation is voluntary Yes  Patient washes hands before animal contact Yes  Patient washes hands after animal contact Yes  Behavioral Response: Did not attend.   Marykay Lexenise L Elaina Cara, LRT/CTRS       Shaina Gullatt L 10/13/2016 3:01 PM

## 2016-10-13 NOTE — [facility] Group Notes (Signed)
[facility] Group Therapy 10/13/2016 1:15pm  Type of Therapy: Mental Health Association Presentation  Participation Level: Active  Participation Quality: Attentive  Affect: Appropriate  Cognitive: Oriented  Insight: Developing/Improving  Engagement in Therapy: Engaged  Modes of Intervention: Discussion, Education and Socialization  Summary of Progress/Problems: Mental Health Association (MHA) Speaker came to talk about his personal journey with mental health. The pt processed ways by which to relate to the speaker. MHA speaker provided handouts and educational information pertaining to groups and services offered by the MHA. Pt was engaged in speaker's presentation and was receptive to resources provided.    Ifrah Vest C Amarianna Abplanalp, LCSW 10/13/2016 2:05 PM  

## 2016-10-13 NOTE — Progress Notes (Signed)
Patient ID: Alicia LeverSharon A Porter, female   DOB: 03/09/1970, 46 y.o.   MRN: [id number]  D: Patient lying in bed with eyes closed. Respirations even and non-labored. Appears to be sleeping. No complaints with nurse from 7-11pm tonight.  A: Staff will monitor on q 15 minute checks, follow treatment plan, and give medications as ordered. R: Cooperative on the unit

## 2016-10-13 NOTE — Progress Notes (Signed)
Pt has been observed most of the evening in the dayroom watching TV.  She reports that today has been ok, but her main concern is that she has been on the Haldol since coming into the hospital and the rehab program she wants to get into will not take her being on the Haldol.  She says the doctor took her off the Haldol, but she is still frustrated that she has to re-submit her application to show that she does not take Haldol.  Otherwise she has been pleasant and appropriate.  Pt denies SI/HI/AVH this evening.  Support and encouragement offered.  Discharge plans are in process.  Pt is med compliant.  Safety maintained with q15 minute checks.

## 2016-10-13 NOTE — Plan of Care (Signed)
Problem: Medication: Goal: Compliance with prescribed medication regimen will improve Outcome: Progressing Pt taking medications as prescribed.   

## 2016-10-13 NOTE — [facility] Group Notes (Signed)
[facility] Group Notes:  (Nursing/MHT/Case Management/Adjunct)  Date:  10/13/2016  Time:  09:30 am  Type of Therapy:  Nurse Education  Participation Level:  Did Not Attend  Participation Quality:    Affect:   Cognitive:   Insight:    Engagement in Group:  Modes of Intervention:   Summary of Progress/Problems:  Alicia Porter 10/13/2016, 4:18 PM

## 2016-10-13 NOTE — Progress Notes (Signed)
[facility] Group Notes:  (Nursing/MHT/Case Management/Adjunct)  Date:  10/13/2016  Time:  2100 Type of Therapy:  wrap up group  Participation Level:  Active  Participation Quality:  Appropriate, Attentive, Sharing and Supportive  Affect:  Appropriate and Flat  Cognitive:  Appropriate  Insight:  Improving  Engagement in Group:  Engaged  Modes of Intervention:  Clarification, Education and Support  Summary of Progress/Problems: Pt shares that she is coming off alcohol. She stops drinking herself by weening herself off and then she has a nervous breakdown. Pt says she stays sober for a while and feels good but always relapses because she goes back to celebrating. 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 Pt does want to go to further treatment for her alcohol abuse upon discharge. Pt would like to have more control in her life over positive thinking and is grateful for her wonderful parents, best friend, dog and cat.   Marcille BuffyMcNeil, Mirka Barbone S 10/13/2016, 11:28 PM

## 2016-10-13 NOTE — [facility] Suicide Risk Assessment (Signed)
[facility] INPATIENT:  Family/Significant Other Suicide Prevention Education  Suicide Prevention Education:  Education Completed; Illene RegulusLois Calloway, Pt's mother (completed in person), has been identified by the patient as the family member/significant other with whom the patient will be residing, and identified as the person(s) who will aid the patient in the event of a mental health crisis (suicidal ideations/suicide attempt).  With written consent from the patient, the family member/significant other has been provided the following suicide prevention education, prior to the and/or following the discharge of the patient.  The suicide prevention education provided includes the following:  Suicide risk factors  Suicide prevention and interventions  National Suicide Hotline telephone number  [facility] assessment telephone number  [facility] Assistance 911  [facility] and/or Residential Mobile Crisis Unit telephone number  Request made of family/significant other to:  Remove weapons (e.g., guns, rifles, knives), all items previously/currently identified as safety concern.    Remove drugs/medications (over-the-counter, prescriptions, illicit drugs), all items previously/currently identified as a safety concern.  The family member/significant other verbalizes understanding of the suicide prevention education information provided.  The family member/significant other agrees to remove the items of safety concern listed above.  Verdene LennertLauren C Abiola Behring 10/13/2016, 2:14 PM

## 2016-10-14 MED ORDER — ARIPIPRAZOLE 2 MG PO TABS
2.0000 mg | ORAL_TABLET | Freq: Every day | ORAL | Status: DC
  Administered 2016-10-14 – 2016-10-18 (×5): 2 mg via ORAL
  Filled 2016-10-14 (×8): qty 1

## 2016-10-14 MED ORDER — GABAPENTIN 100 MG PO CAPS
200.0000 mg | ORAL_CAPSULE | Freq: Two times a day (BID) | ORAL | Status: DC
  Administered 2016-10-14 – 2016-10-18 (×8): 200 mg via ORAL
  Filled 2016-10-14 (×13): qty 2

## 2016-10-14 NOTE — Progress Notes (Signed)
[facility] MD Progress Note  10/14/2016 11:46 AM JULLIAN PREVITI  MRN:  [id number]   Subjective:  Patient reports she is better, but states she remains anxious, worried. Denies hallucinations, no delusions expressed . She remains ruminative about disposition planning , wants to go to [facility], but worries she may not be accepted .  Denies suicidal ideations.  Denies medication side effects.  Reports insomnia .  Objective : I have discussed case with treatment team and have met with patient. Patient reports some improvement compared to admission, states " I am thinking clearer, I am no longer feeling like people are laughing at me , and I feel more sociable". She reports ongoing anxiety, and remains ruminative about discharge planning , but states she feels less severely anxious than before she was admitted . Denies suicidal ideations. No current significant alcohol withdrawal at this time, vitals are stable. She wanted to stop Haldol due to concern she would not be accepted at rehab program on said medication, and denies any current psychotic symptoms. She states , however, that she feels vaguely paranoid, mainly related to alcohol, but today states " I do feel kind of paranoid even when I am sober" but describes this more as social anxiety ( feeling she will not be liked, or that people will make fun of her behind her back ) . Visible in day room, going to some groups   Principal Problem: Major depressive disorder, recurrent episode, severe, with psychotic behavior ([place]) Diagnosis:   Patient Active Problem List   Diagnosis Date Noted  . Alcohol use disorder ([place]) [F10.99] 10/10/2016  . Major depressive disorder, recurrent episode, severe, with psychosis ([employer]) [F33.3] 10/09/2016  . Alcohol dependence with withdrawal, uncomplicated ([place]) [C12.751] 10/09/2016  . Major depressive disorder, recurrent episode, severe, with psychotic behavior ([place]) [F33.3] 10/09/2016  . Obstructive sleep apnea of  adult [G47.33] 04/13/2014  . Sleep onset Insomnia [G47.00] 04/13/2014  . Persistent disorder of initiating or maintaining sleep [G47.00] 04/13/2014  . Parasomnia [G47.50] 04/13/2014  . Restless leg syndrome [G25.81] 04/13/2014  . CPAP (continuous positive airway pressure) dependence [Z99.89] 04/13/2014  . Bruxism [F45.8] 04/13/2014  . PCOS (polycystic ovarian syndrome) [E28.2] 12/09/2012  . Encounter for long-term (current) use of other medications [Z79.899] 12/09/2012  . Tobacco use disorder [F17.200] 12/09/2012  . Type II or unspecified type diabetes mellitus without mention of complication, not stated as uncontrolled [E11.9] 12/09/2012  . Major depressive disorder, recurrent ([place]) [F33.9] 01/24/2011  . Generalized anxiety disorder [F41.1] 01/24/2011  . Borderline personality disorder [F60.3] 01/24/2011   Total Time spent with patient: 20 minutes  Past Psychiatric History:   Past Medical History:  Past Medical History:  Diagnosis Date  . Depression   . Diabetes mellitus without complication ([place])   . Hyperlipidemia   . Polycystic ovarian disease   . Polycystic ovarian disease 09/08/2012   takes Aldactone for it  . Sleep apnea    History reviewed. No pertinent surgical history. Family History:  Family History  Problem Relation Age of Onset  . Heart disease Mother   . Diabetes Mother   . Hyperlipidemia Mother    Family Psychiatric  History:  Social History:  History  Alcohol Use  . Yes    Comment: last drink 09/29/16     History  Drug Use No    Social History   Social History  . Marital status: Single    Spouse name: N/A  . Number of children: N/A  . Years of education: N/A  Social History Main Topics  . Smoking status: Current Every Day Smoker    Packs/day: 1.00  . Smokeless tobacco: Never Used  . Alcohol use Yes     Comment: last drink 09/29/16  . Drug use: No  . Sexual activity: Yes   Other Topics Concern  . None   Social History Narrative  . None    Additional Social History:   Sleep: fair   Appetite: good   Current Medications: Current Facility-Administered Medications  Medication Dose Route Frequency Provider Last Rate Last Dose  . acetaminophen (TYLENOL) tablet 650 mg  650 mg Oral Q4H PRN Patrecia Pour, NP      . alum & mag hydroxide-simeth (MAALOX/MYLANTA) 200-200-20 MG/5ML suspension 30 mL  30 mL Oral Q6H PRN Patrecia Pour, NP      . citalopram (CELEXA) tablet 20 mg  20 mg Oral Daily Patrecia Pour, NP   20 mg at 10/14/16 0263  . gabapentin (NEURONTIN) capsule 100 mg  100 mg Oral BID Money, Lowry Ram, FNP   100 mg at 10/14/16 7858  . gabapentin (NEURONTIN) capsule 600 mg  600 mg Oral QHS Money, Lowry Ram, FNP   600 mg at 10/13/16 2108  . hydrOXYzine (ATARAX/VISTARIL) tablet 25 mg  25 mg Oral TID PRN Patrecia Pour, NP   25 mg at 10/12/16 0957  . magnesium hydroxide (MILK OF MAGNESIA) suspension 30 mL  30 mL Oral Daily PRN Patrecia Pour, NP      . naltrexone (DEPADE) tablet 50 mg  50 mg Oral Daily Artist Beach, MD   50 mg at 10/14/16 8502  . nicotine (NICODERM CQ - dosed in mg/24 hours) patch 21 mg  21 mg Transdermal Daily Patrecia Pour, NP   21 mg at 10/14/16 7741  . ondansetron (ZOFRAN) tablet 4 mg  4 mg Oral Q8H PRN Patrecia Pour, NP      . traZODone (DESYREL) tablet 50 mg  50 mg Oral QHS Money, Lowry Ram, FNP   50 mg at 10/13/16 2108    Lab Results:  No results found for this or any previous visit (from the past 48 hour(s)).  Blood Alcohol level:  Lab Results  Component Value Date   ETH <5 10/08/2016   ETH 126 (H) 28/78/6767    Metabolic Disorder Labs: Lab Results  Component Value Date   HGBA1C 5.6 10/12/2016   MPG 114.02 10/12/2016   Lab Results  Component Value Date   PROLACTIN 42.8 (H) 10/12/2016   Lab Results  Component Value Date   CHOL 149 10/12/2016   TRIG 135 10/12/2016   HDL 26 (L) 10/12/2016   CHOLHDL 5.7 10/12/2016   VLDL 27 10/12/2016   LDLCALC 96 10/12/2016   LDLCALC 39  08/08/2013    Physical Findings: AIMS: Facial and Oral Movements Muscles of Facial Expression: None, normal Lips and Perioral Area: None, normal Jaw: None, normal Tongue: None, normal,Extremity Movements Upper (arms, wrists, hands, fingers): None, normal Lower (legs, knees, ankles, toes): None, normal, Trunk Movements Neck, shoulders, hips: None, normal, Overall Severity Severity of abnormal movements (highest score from questions above): None, normal Incapacitation due to abnormal movements: None, normal Patient's awareness of abnormal movements (rate only patient's report): No Awareness, Dental Status Current problems with teeth and/or dentures?: No Does patient usually wear dentures?: No  CIWA:  CIWA-Ar Total: 3 COWS:     Musculoskeletal: Strength & Muscle Tone: within normal limits Gait & Station: normal Patient leans: N/A  Psychiatric  Specialty Exam: Physical Exam  Vitals reviewed. Constitutional: She is oriented to person, place, and time.  Cardiovascular: Normal rate.   Neurological: She is alert and oriented to person, place, and time.  Psychiatric: She has a normal mood and affect.    Review of Systems  Psychiatric/Behavioral: Positive for depression and suicidal ideas. The patient has insomnia.   denies headache, no chest pain , no shortness of breath, no vomiting   Blood pressure 111/72, pulse 81, temperature 98.5 F (36.9 C), temperature source Oral, resp. rate 18, height '5\' 6"'  (1.676 m), weight 101.6 kg (224 lb).Body mass index is 36.15 kg/m.  General Appearance: improved grooming   Eye Contact:  Good  Speech:  Normal Rate  Volume:  Normal  Mood:  partial improvement, but remains depressed, anxious   Affect:  anxious   Thought Process:  Linear and Descriptions of Associations: Intact  Orientation:  Full (Time, Place, and Person)  Thought Content:  no hallucinations, no delusions, some self referential ideations   Suicidal Thoughts:  No denies suicidal  ideations, denies self injurious ideations , contracts for safety on unit   Homicidal Thoughts:  No  Memory:  Recent and remote grossly intact   Judgement:  Other:  improving   Insight:  improving   Psychomotor Activity:  Normal  Concentration:  Concentration: Good and Attention Span: Good  Recall:  Good  Fund of Knowledge:  Good  Language:  Good  Akathisia:  No  Handed:  Right  AIMS (if indicated):     Assets:  Desire for Improvement Physical Health Resilience Social Support  ADL's:  Intact  Cognition:  WNL  Sleep:  Number of Hours: 5.75   Assessment -patient reports partial improvement compared to how she felt prior to admission. She continues to present vaguely depressed, and is anxious , ruminative . She is not presenting with alcohol withdrawal, and does not appear to be in any acute distress or discomfort. She reports vague self referential ideations, which may be related to social anxiety/phobia, as she describes thoughts of people not liking her or of making fun of her due to weight and  being " socially awkward")  Treatment Plan Summary: Treatment plan reviewed today 9/5 Daily contact with patient to assess and evaluate symptoms and progress in treatment and Medication management  Encourage ongoing group and milieu participation to work on coping skills and symptom reduction Continue to encourage efforts to work on sobriety and relapse prevention Continue Celexa 20 mgrs QDAY for depression, anxiety Start Abilify 2 mgrs QHS for antidepressant agumentation  Continue Trazodone 50 mg QHS for insomnia as needed  Increase  Neurontin  200 mgrs BID and 600 mgrs QHS for anxiety,pain Continue Naltrexone 50 mgrs QDAY to help with alcohol abstinence  Treatment team working on disposition planning as above - patient hopeful to be accepted to rehab [facility])    Jenne Campus, MD 10/14/2016, 11:46 AM   Patient ID: Claris Gladden, female   DOB: 08-26-1970, 46 y.o.   MRN:  [id number]

## 2016-10-14 NOTE — Progress Notes (Signed)
D: Patient observed to be resting in bed, not attending groups this AM. Patient verbalizes minimal information, even when prompted. Does endorse poor sleep but continues to nap during the day. Patient's affect depressed and sullen with congruent mood. Per self inventory and discussions with writer, rates depression at a 4/10, hopelessness at a 0/10 and anxiety at a 2/10. Rates sleep as poor, appetite as good, energy as low and concentration as good.  States goal for today is "getting into rehab, eat, groups." Denies pain, physical problems at this time.   A: Medicated per orders, no prns requested or required. Level III obs in place for safety. Emotional support offered and self inventory reviewed. Encouraged completion of Suicide Safety Plan and programming participation. Discussed POC with MD, SW. Patient strongly encouraged to be up and active during the day to promote a healthy sleep-wake cycle.  R: Patient verbalizes understanding of POC.  Patient denies SI/HI/AVH and remains safe on level III obs. Will continue to monitor closely and make verbal contact frequently.

## 2016-10-14 NOTE — [facility] Group Notes (Signed)
LCSW Group Therapy Note  10/14/2016 1:15pm  Type of Therapy/Topic:  Group Therapy:  Balance in Life  Participation Level:  Active  Description of Group:    This group will address the concept of balance and how it feels and looks when one is unbalanced. Patients will be encouraged to process areas in their lives that are out of balance and identify reasons for remaining unbalanced. Facilitators will guide patients in utilizing problem-solving interventions to address and correct the stressor making their life unbalanced. Understanding and applying boundaries will be explored and addressed for obtaining and maintaining a balanced life. Patients will be encouraged to explore ways to assertively make their unbalanced needs known to significant others in their lives, using other group members and facilitator for support and feedback.  Therapeutic Goals: 1. Patient will identify two or more emotions or situations they have that consume much of in their lives. 2. Patient will identify signs/triggers that life has become out of balance:  3. Patient will identify two ways to set boundaries in order to achieve balance in their lives:  4. Patient will demonstrate ability to communicate their needs through discussion and/or role plays  Summary of Patient Progress:   Alicia Porter was attentive and engaged during today's processing group. She shared a picture of lightening in the sky as her perception of balance. "This represents serenity and beauty of nature with also the crazy, dangerous part of nature." Alicia Porter actively listened as others shared what unbalance looks like and ways to reincorporate a sense of balance in life. She continues to improve in the group setting with improving insight.    Therapeutic Modalities:   Cognitive Behavioral Therapy Solution-Focused Therapy Assertiveness Training  Pulte HomesHeather N Smart, LCSW 10/14/2016 1:56 PM

## 2016-10-14 NOTE — Plan of Care (Signed)
Problem: Education: Goal: Verbalization of understanding the information provided will improve Outcome: Completed/Met Date Met: 10/14/16 Patient consistently verbalizes understanding of information, education provided.  Problem: Safety: Goal: Periods of time without injury will increase Outcome: Completed/Met Date Met: 10/14/16 Patient has not engaged in self harm, denies thoughts to do so. Also denies SI.

## 2016-10-14 NOTE — Progress Notes (Signed)
Recreation Therapy Notes  Date: 10/14/16 Time: 0930 Location: [address] Hall Dayroom  Group Topic: Stress Management  Goal Area(s) Addresses:  Patient will verbalize importance of using healthy stress management.  Patient will identify positive emotions associated with healthy stress management.   Intervention: Stress Management  Activity :  [employer] [employer].  LRT introduced the stress management technique of guided imagery.  LRT read a script to allow patients to engage in a mental vacation in the wilderness.  Patients were to follow along as the script was read to participate in the activity.  Education:  Stress Management, Discharge Planning.   Education Outcome: Acknowledges edcuation/In group clarification offered/Needs additional education  Clinical Observations/Feedback: Pt did not attend group.   Alicia Porter, Alicia Porter         Lillia AbedLindsay, Stony Stegmann A 10/14/2016 1:18 PM

## 2016-10-15 MED ORDER — TRAZODONE HCL 100 MG PO TABS
100.0000 mg | ORAL_TABLET | Freq: Every day | ORAL | Status: DC
  Administered 2016-10-15 – 2016-10-17 (×3): 100 mg via ORAL
  Filled 2016-10-15 (×6): qty 1

## 2016-10-15 NOTE — Plan of Care (Signed)
Problem: Medication: Goal: Compliance with prescribed medication regimen will improve Outcome: Completed/Met Date Met: 10/15/16 Patient is med compliant and verbalizes intent to remain so upon discharge.  Problem: Safety: Goal: Ability to remain free from injury will improve Outcome: Completed/Met Date Met: 10/15/16 Patient has not engaged in self harm, denies thoughts to do so. No SI.

## 2016-10-15 NOTE — Progress Notes (Signed)
Patient attended group and said that her day was a 6.  Her coping skill was praying.

## 2016-10-15 NOTE — Progress Notes (Signed)
Pt reports that today has been somewhat better than yesterday.  She is still anxious about being accepted at the [facility] rehab, but was encouraged by the help she is receiving from the social worker to expedite the admission process.  She voices no other needs or concerns.  She denies SI/HI/AVH.  Support and encouragement offered.  Discharge plans are in process.  Safety maintained with q15 minute checks.  Addendum:  Pt had difficulty going to sleep and received Vistaril prn.  It was after midnight before she actually went to sleep.

## 2016-10-15 NOTE — Progress Notes (Addendum)
D: Patient observed up and visible in the milieu. Reports feeling somewhat better today and was up and showered this AM. Patient states, "I am doing better, much better. I really hope the treatment center works out." Patient did become anxious midday in regards to discharge plan. Frequent contacts made 1:1 throughout shift. Patient's affect blank, anxious with anxious mood. Patient slow to respond in conversation and thought blocking suspected however much more spontaneous in her speech patterns this evening. Per self inventory and discussions with writer, rates depression at a 5/10, hopelessness at a 0/10 and anxiety at a 4/10. Rates sleep as fair, appetite as fair, energy as low and concentration as poor.  States goal for today is "getting better." Denies pain but did complain of constipation.   A: Medicated per orders, prn MOM given for discomfort. Vistaril given before lunch. Level III obs in place for safety. Emotional support offered and self inventory reviewed. Encouraged completion of Suicide Safety Plan and programming participation. Discussed POC with MD, SW.   R: Patient verbalizes understanding of POC. On reassess, patient reports small BM, reduction in anxiety. Patient denies SI/HI/AVH and remains safe on level III obs. Will continue to monitor closely and make verbal contact frequently.

## 2016-10-15 NOTE — Progress Notes (Signed)
[facility] Group Notes:  (Nursing/MHT/Case Management/Adjunct)  Date:  10/14/2016 Time:  2045   Type of Therapy:  wrap up group  Participation Level:  Active  Participation Quality:  Appropriate, Attentive and Supportive  Affect:  Depressed and Flat  Cognitive:  Appropriate  Insight:  Improving  Engagement in Group:  Supportive  Modes of Intervention:  Clarification, Education and Support  Summary of Progress/Problems: Pt reported having more energy and appetite than yesterday but doesn't know what to attribute the increase to. In regards to needs vs wants, pt reports needing support and wanting a clean re-start. If patient could change any one thing in her life it would the death of her animals (pets). Pt shared that one way to better herself would be to stay sober.   Johann CapersMcNeil, Cedra Villalon S 10/15/2016, 2:58 AM

## 2016-10-15 NOTE — Progress Notes (Signed)
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 She feels like she should just walk away from it because she has messed up too much. 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  She denies any SI/HI/AVH and contracts for safety, but still admits depression and minor paranoia. She denies any withdrawal symptoms. She did have a complaint of disrupted sleep and asked if something could be done to help it.  Objective: She is pleasant and cooperative. She has a better affect today and smiles a few times, but still appears depressed. She has a legitimate concern about her career. She is encouraged to speak with SW or peer support specialist and she agrees. I also encourage her to push forward with her career and she agreed. Patient will get increased Trazodone tonight for sleep. Patient shows no signs of psychosis. Patient is stabilized and can go to a treatment facility for long term treatment as soon as a bed is open.  Principal Problem: Major depressive disorder, recurrent episode, severe, with psychotic behavior (HCC) Diagnosis:   Patient Active Problem List   Diagnosis Date Noted  . Alcohol use disorder (HCC) [F10.99] 10/10/2016  . Major depressive disorder, recurrent episode, severe, with psychosis (HCC) [F33.3] 10/09/2016  . Alcohol dependence with withdrawal, uncomplicated (HCC) [F10.230] 10/09/2016  . Major depressive disorder, recurrent episode, severe, with psychotic behavior (HCC) [F33.3] 10/09/2016  . Obstructive sleep apnea of adult [G47.33] 04/13/2014  . Sleep onset Insomnia [G47.00] 04/13/2014  . Persistent disorder of initiating or maintaining sleep [G47.00] 04/13/2014  . Parasomnia [G47.50] 04/13/2014  . Restless leg syndrome [G25.81] 04/13/2014  . CPAP  (continuous positive airway pressure) dependence [Z99.89] 04/13/2014  . Bruxism [F45.8] 04/13/2014  . PCOS (polycystic ovarian syndrome) [E28.2] 12/09/2012  . Encounter for long-term (current) use of other medications [Z79.899] 12/09/2012  . Tobacco use disorder [F17.200] 12/09/2012  . Type II or unspecified type diabetes mellitus without mention of complication, not stated as uncontrolled [E11.9] 12/09/2012  . Major depressive disorder, recurrent (HCC) [F33.9] 01/24/2011  . Generalized anxiety disorder [F41.1] 01/24/2011  . Borderline personality disorder [F60.3] 01/24/2011   Total Time spent with patient: 25 minutes  Past Psychiatric History: See H&P  Past Medical History:  Past Medical History:  Diagnosis Date  . Depression   . Diabetes mellitus without complication (HCC)   . Hyperlipidemia   . Polycystic ovarian disease   . Polycystic ovarian disease 09/08/2012   takes Aldactone for it  . Sleep apnea    History reviewed. No pertinent surgical history. Family History:  Family History  Problem Relation Age of Onset  . Heart disease Mother   . Diabetes Mother   . Hyperlipidemia Mother    Family Psychiatric  History: See H&P Social History:  History  Alcohol Use  . Yes    Comment: last drink 09/29/16     History  Drug Use No    Social History   Social History  . Marital status: Single    Spouse name: N/A  . Number of children: N/A  . Years of education: N/A   Social History Main Topics  . Smoking status: Current Every Day Smoker    Packs/day: 1.00  . Smokeless tobacco: Never Used  . Alcohol use  Yes     Comment: last drink 09/29/16  . Drug use: No  . Sexual activity: Yes   Other Topics Concern  . None   Social History Narrative  . None   Additional Social History:                         Sleep: Fair  Appetite:  Good  Current Medications: Current Facility-Administered Medications  Medication Dose Route Frequency Provider Last Rate Last  Dose  . acetaminophen (TYLENOL) tablet 650 mg  650 mg Oral Q4H PRN Charm Rings, NP      . alum & mag hydroxide-simeth (MAALOX/MYLANTA) 200-200-20 MG/5ML suspension 30 mL  30 mL Oral Q6H PRN Charm Rings, NP      . ARIPiprazole (ABILIFY) tablet 2 mg  2 mg Oral Daily Stella Bortle, Rockey Situ, MD   2 mg at 10/15/16 0817  . citalopram (CELEXA) tablet 20 mg  20 mg Oral Daily Charm Rings, NP   20 mg at 10/15/16 0817  . gabapentin (NEURONTIN) capsule 200 mg  200 mg Oral BID Tashima Scarpulla, Rockey Situ, MD   200 mg at 10/15/16 0817  . gabapentin (NEURONTIN) capsule 600 mg  600 mg Oral QHS Money, Gerlene Burdock, FNP   600 mg at 10/14/16 2130  . hydrOXYzine (ATARAX/VISTARIL) tablet 25 mg  25 mg Oral TID PRN Charm Rings, NP   25 mg at 10/15/16 1217  . magnesium hydroxide (MILK OF MAGNESIA) suspension 30 mL  30 mL Oral Daily PRN Charm Rings, NP   30 mL at 10/15/16 0819  . naltrexone (DEPADE) tablet 50 mg  50 mg Oral Daily Izediuno, Vincent A, MD   50 mg at 10/15/16 0817  . nicotine (NICODERM CQ - dosed in mg/24 hours) patch 21 mg  21 mg Transdermal Daily Charm Rings, NP   21 mg at 10/15/16 0817  . ondansetron (ZOFRAN) tablet 4 mg  4 mg Oral Q8H PRN Charm Rings, NP      . traZODone (DESYREL) tablet 100 mg  100 mg Oral QHS Money, Gerlene Burdock, FNP        Lab Results: No results found for this or any previous visit (from the past 48 hour(s)).  Blood Alcohol level:  Lab Results  Component Value Date   ETH <5 10/08/2016   ETH 126 (H) 10/04/2013    Metabolic Disorder Labs: Lab Results  Component Value Date   HGBA1C 5.6 10/12/2016   MPG 114.02 10/12/2016   Lab Results  Component Value Date   PROLACTIN 42.8 (H) 10/12/2016   Lab Results  Component Value Date   CHOL 149 10/12/2016   TRIG 135 10/12/2016   HDL 26 (L) 10/12/2016   CHOLHDL 5.7 10/12/2016   VLDL 27 10/12/2016   LDLCALC 96 10/12/2016   LDLCALC 39 08/08/2013    Physical Findings: AIMS: Facial and Oral Movements Muscles of Facial  Expression: None, normal Lips and Perioral Area: None, normal Jaw: None, normal Tongue: None, normal,Extremity Movements Upper (arms, wrists, hands, fingers): None, normal Lower (legs, knees, ankles, toes): None, normal, Trunk Movements Neck, shoulders, hips: None, normal, Overall Severity Severity of abnormal movements (highest score from questions above): None, normal Incapacitation due to abnormal movements: None, normal Patient's awareness of abnormal movements (rate only patient's report): No Awareness, Dental Status Current problems with teeth and/or dentures?: No Does patient usually wear dentures?: No  CIWA:  CIWA-Ar Total: 3 COWS:     Musculoskeletal:  Strength & Muscle Tone: within normal limits Gait & Station: normal Patient leans: N/A  Psychiatric Specialty Exam: Physical Exam  Vitals reviewed. Constitutional: She is oriented to person, place, and time. She appears well-developed and well-nourished.  Cardiovascular: Normal rate.   Respiratory: Effort normal.  Musculoskeletal: Normal range of motion.  Neurological: She is alert and oriented to person, place, and time.    Review of Systems  Constitutional: Negative.   HENT: Negative.   Respiratory: Negative.   Cardiovascular: Negative.   Gastrointestinal: Negative.   Genitourinary: Negative.   Musculoskeletal: Negative.   Skin: Negative.   Neurological: Negative.   Endo/Heme/Allergies: Negative.     Blood pressure 102/72, pulse 95, temperature 98.5 F (36.9 C), resp. rate 20, height 5\' 6"  (1.676 m), weight 101.6 kg (224 lb).Body mass index is 36.15 kg/m.  General Appearance: Casual  Eye Contact:  Good  Speech:  Clear and Coherent and Normal Rate  Volume:  Normal  Mood:  Depressed  Affect:  Depressed and but improving  Thought Process:  Coherent and Descriptions of Associations: Intact  Orientation:  Full (Time, Place, and Person)  Thought Content:  WDL  Suicidal Thoughts:  No  Homicidal Thoughts:  No   Memory:  Immediate;   Good Recent;   Good  Judgement:  Fair  Insight:  Good  Psychomotor Activity:  Normal  Concentration:  Concentration: Good and Attention Span: Good  Recall:  Good  Fund of Knowledge:  Good  Language:  Good  Akathisia:  No  Handed:  Right  AIMS (if indicated):     Assets:  Desire for Improvement Financial Resources/Insurance Housing Social Support Transportation  ADL's:  Intact  Cognition:  WNL  Sleep:  Number of Hours: 5.25     Treatment Plan Summary: Daily contact with patient to assess and evaluate symptoms and progress in treatment, Medication management and Plan is to:   -Continue Abilify 2 mg PO Daily for mood stability -Continue Neurontin 200 mg PO BID and 600 mg PO QHS for withdrawal symptoms and mood control -Continue Naltrexone 50 mg PO Daily for withdrawal -Increase Trazodone 100 mg PO QHS for insomnia -Continue Vistaril 25 mg PO TID PRN for anxiety -Continue Celexa 20 mg PO Daily for mood stability -Encourage group therapy participation -Encourage 1:1 peer suppport counseling.  Gerlene Burdockravis B Money, FNP 10/15/2016, 1:02 PM   Agree with NP Progress Note

## 2016-10-15 NOTE — [facility] Group Notes (Signed)
LCSW Group Therapy Note  10/15/2016 1:15pm  Type of Therapy/Topic:  Group Therapy:  Emotion Regulation  Participation Level:  Minimal   Description of Group:   The purpose of this group is to assist patients in learning to regulate negative emotions and experience positive emotions. Patients will be guided to discuss ways in which they have been vulnerable to their negative emotions. These vulnerabilities will be juxtaposed with experiences of positive emotions or situations, and patients will be challenged to use positive emotions to combat negative ones. Special emphasis will be placed on coping with negative emotions in conflict situations, and patients will process healthy conflict resolution skills.  Therapeutic Goals: 1. Patient will identify two positive emotions or experiences to reflect on in order to balance out negative emotions 2. Patient will label two or more emotions that they find the most difficult to experience 3. Patient will demonstrate positive conflict resolution skills through discussion and/or role plays  Summary of Patient Progress: Pt was present in group but did not participate in group discussion.    Therapeutic Modalities:   Cognitive Behavioral Therapy Feelings Identification Dialectical Behavioral Therapy   Verdene LennertLauren C Lariyah Shetterly, LCSW 10/15/2016 3:12 PM

## 2016-10-15 NOTE — Progress Notes (Signed)
[facility] MD Progress Note  10/15/2016 8:43 AM Alicia Porter  MRN:  [id number]   Subjective:   Today patient acknowledges some improvement compared to admission and states she is happy that she has been accepted to [facility] ( has bed on Monday) . She reports ongoing anxiety, subjective sense of frustration particularly regarding paperwork issues. Denies medication side effects.  Objective : I have discussed case with treatment team and have met with patient. Patient presents anxious, vaguely irritable, and ruminative about work related paperwork. States " my mother went to my work's HR to pick up [employer], but they gave her FMLA papers". States " I don't even know where to begin filling this out, plus it is not what I need to turn in". She responds partially to reassurance, support, and have referred her to CSW in order to help determine if appropriate paperwork can be sent to her to unit , if she provides appropriate consent to contact HR . Staff notes indicate patient presents with a brighter affect and overall improved mood. Has denied SI. She is future oriented, improved compared to admission, and is  looking forward to going to [facility], but presents easily overwhelmed with potential challenges or problems such as when she was told she would  not be accepted if on Haldol ( now on Abilify) and more recently with paperwork as above  No disruptive or agitated behaviors. At this time denies medication side effects. No current suicidal ideations.   Principal Problem: Major depressive disorder, recurrent episode, severe, with psychotic behavior ([place]) Diagnosis:   Patient Active Problem List   Diagnosis Date Noted  . Alcohol use disorder ([place]) [F10.99] 10/10/2016  . Major depressive disorder, recurrent episode, severe, with psychosis ([place]) [F33.3] 10/09/2016  . Alcohol dependence with withdrawal, uncomplicated ([employer]) [I43.329] 10/09/2016  . Major depressive  disorder, recurrent episode, severe, with psychotic behavior ([place]) [F33.3] 10/09/2016  . Obstructive sleep apnea of adult [G47.33] 04/13/2014  . Sleep onset Insomnia [G47.00] 04/13/2014  . Persistent disorder of initiating or maintaining sleep [G47.00] 04/13/2014  . Parasomnia [G47.50] 04/13/2014  . Restless leg syndrome [G25.81] 04/13/2014  . CPAP (continuous positive airway pressure) dependence [Z99.89] 04/13/2014  . Bruxism [F45.8] 04/13/2014  . PCOS (polycystic ovarian syndrome) [E28.2] 12/09/2012  . Encounter for long-term (current) use of other medications [Z79.899] 12/09/2012  . Tobacco use disorder [F17.200] 12/09/2012  . Type II or unspecified type diabetes mellitus without mention of complication, not stated as uncontrolled [E11.9] 12/09/2012  . Major depressive disorder, recurrent ([place]) [F33.9] 01/24/2011  . Generalized anxiety disorder [F41.1] 01/24/2011  . Borderline personality disorder [F60.3] 01/24/2011   Total Time spent with patient: 20 minutes  Past Psychiatric History:   Past Medical History:  Past Medical History:  Diagnosis Date  . Depression   . Diabetes mellitus without complication ([place])   . Hyperlipidemia   . Polycystic ovarian disease   . Polycystic ovarian disease 09/08/2012   takes Aldactone for it  . Sleep apnea    History reviewed. No pertinent surgical history. Family History:  Family History  Problem Relation Age of Onset  . Heart disease Mother   . Diabetes Mother   . Hyperlipidemia Mother    Family Psychiatric  History:  Social History:  History  Alcohol Use  . Yes    Comment: last drink 09/29/16     History  Drug Use No    Social History   Social History  . Marital status: Single  Spouse name: N/A  . Number of children: N/A  . Years of education: N/A   Social History Main Topics  . Smoking status: Current Every Day Smoker    Packs/day: 1.00  . Smokeless tobacco: Never Used  . Alcohol use Yes     Comment: last drink  09/29/16  . Drug use: No  . Sexual activity: Yes   Other Topics Concern  . None   Social History Narrative  . None   Additional Social History:   Sleep: fair - improving ( uses CPAP)   Appetite: good   Current Medications: Current Facility-Administered Medications  Medication Dose Route Frequency Provider Last Rate Last Dose  . acetaminophen (TYLENOL) tablet 650 mg  650 mg Oral Q4H PRN Charm Rings, NP      . alum & mag hydroxide-simeth (MAALOX/MYLANTA) 200-200-20 MG/5ML suspension 30 mL  30 mL Oral Q6H PRN Charm Rings, NP      . ARIPiprazole (ABILIFY) tablet 2 mg  2 mg Oral Daily Arie Gable, Rockey Situ, MD   2 mg at 10/15/16 0817  . citalopram (CELEXA) tablet 20 mg  20 mg Oral Daily Charm Rings, NP   20 mg at 10/15/16 0817  . gabapentin (NEURONTIN) capsule 200 mg  200 mg Oral BID Vernessa Likes, Rockey Situ, MD   200 mg at 10/15/16 0817  . gabapentin (NEURONTIN) capsule 600 mg  600 mg Oral QHS Money, Gerlene Burdock, FNP   600 mg at 10/14/16 2130  . hydrOXYzine (ATARAX/VISTARIL) tablet 25 mg  25 mg Oral TID PRN Charm Rings, NP   25 mg at 10/14/16 2158  . magnesium hydroxide (MILK OF MAGNESIA) suspension 30 mL  30 mL Oral Daily PRN Charm Rings, NP   30 mL at 10/15/16 0819  . naltrexone (DEPADE) tablet 50 mg  50 mg Oral Daily Izediuno, Vincent A, MD   50 mg at 10/15/16 0817  . nicotine (NICODERM CQ - dosed in mg/24 hours) patch 21 mg  21 mg Transdermal Daily Charm Rings, NP   21 mg at 10/15/16 0817  . ondansetron (ZOFRAN) tablet 4 mg  4 mg Oral Q8H PRN Charm Rings, NP      . traZODone (DESYREL) tablet 50 mg  50 mg Oral QHS Money, Gerlene Burdock, FNP   50 mg at 10/14/16 2130    Lab Results:  No results found for this or any previous visit (from the past 48 hour(s)).  Blood Alcohol level:  Lab Results  Component Value Date   ETH <5 10/08/2016   ETH 126 (H) 10/04/2013    Metabolic Disorder Labs: Lab Results  Component Value Date   HGBA1C 5.6 10/12/2016   MPG 114.02 10/12/2016    Lab Results  Component Value Date   PROLACTIN 42.8 (H) 10/12/2016   Lab Results  Component Value Date   CHOL 149 10/12/2016   TRIG 135 10/12/2016   HDL 26 (L) 10/12/2016   CHOLHDL 5.7 10/12/2016   VLDL 27 10/12/2016   LDLCALC 96 10/12/2016   LDLCALC 39 08/08/2013    Physical Findings: AIMS: Facial and Oral Movements Muscles of Facial Expression: None, normal Lips and Perioral Area: None, normal Jaw: None, normal Tongue: None, normal,Extremity Movements Upper (arms, wrists, hands, fingers): None, normal Lower (legs, knees, ankles, toes): None, normal, Trunk Movements Neck, shoulders, hips: None, normal, Overall Severity Severity of abnormal movements (highest score from questions above): None, normal Incapacitation due to abnormal movements: None, normal Patient's awareness of abnormal movements (rate  only patient's report): No Awareness, Dental Status Current problems with teeth and/or dentures?: No Does patient usually wear dentures?: No  CIWA:  CIWA-Ar Total: 3 COWS:     Musculoskeletal: Strength & Muscle Tone: within normal limits Gait & Station: normal Patient leans: N/A  Psychiatric Specialty Exam: Physical Exam  Vitals reviewed. Constitutional: She is oriented to person, place, and time.  Cardiovascular: Normal rate.   Neurological: She is alert and oriented to person, place, and time.  Psychiatric: She has a normal mood and affect.    Review of Systems  Psychiatric/Behavioral: Positive for depression and suicidal ideas. The patient has insomnia.   denies headache, no chest pain , no shortness of breath, no vomiting   Blood pressure 111/72, pulse 81, temperature 98.5 F (36.9 C), temperature source Oral, resp. rate 18, height _0  (1.676 m), weight 101.6 kg (224 lb).Body mass index is 36.15 kg/m.  General Appearance: well groomed   Eye Contact:  Good  Speech:  Normal Rate  Volume:  Normal  Mood:  improved compared to admission  Affect:  remains  anxious, and intermittently irritable   Thought Process:  Linear and Descriptions of Associations: Intact  Orientation:  Full (Time, Place, and Person)  Thought Content:  no hallucinations, no delusions, some self referential ideations   Suicidal Thoughts:  No denies suicidal ideations, denies self injurious ideations , contracts for safety on unit   Homicidal Thoughts:  No  Memory:  Recent and remote grossly intact   Judgement:  Other:  improving   Insight:  improving   Psychomotor Activity:  Normal  Concentration:  Concentration: Good and Attention Span: Good  Recall:  Good  Fund of Knowledge:  Good  Language:  Good  Akathisia:  No  Handed:  Right  AIMS (if indicated):     Assets:  Desire for Improvement Physical Health Resilience Social Support  ADL's:  Intact  Cognition:  WNL  Sleep:  Number of Hours: 5.25   Assessment - patient is presenting with improving mood and range of affect, but remains vaguely irritable, and ruminative about work related paperwork today. No current symptoms of alcohol WDL. She is denying suicidal ideations and is future oriented, expressing desire to go to [facility] residential rehab setting - has bed available on Monday. Currently tolerating medications well .  Treatment Plan Summary: Treatment plan reviewed today 9/7 Daily contact with patient to assess and evaluate symptoms and progress in treatment and Medication management  Encourage ongoing group and milieu participation to work on coping skills and symptom reduction Continue to encourage efforts to work on sobriety and relapse prevention Continue Celexa 20 mgrs QDAY for depression, anxiety Continue Abilify 2 mgrs QHS for antidepressant agumentation  Continue Trazodone 50 mg QHS for insomnia as needed  Continue Neurontin  200 mgrs BID and 600 mgrs QHS for anxiety,pain Continue Naltrexone 50 mgrs QDAY to help with alcohol abstinence  Treatment team working on disposition planning as above -  patient hopeful to be accepted to rehab [facility])    Jenne Campus, MD 10/15/2016, 8:43 AM   Patient ID: Alicia Porter, female   DOB: 25-Aug-1970, 46 y.o.   MRN: [id number]

## 2016-10-15 NOTE — Progress Notes (Signed)
Nutrition Education Note  Pt attended group focusing on general, healthful nutrition education.  RD emphasized the importance of eating regular meals and snacks throughout the day. Consuming sugar-free beverages and incorporating fruits and vegetables into diet when possible. Provided examples of healthy snacks. Patient encouraged to leave group with a goal to improve nutrition/healthy eating.   Diet Order: Diet Heart Room service appropriate? Yes; Fluid consistency: Thin Pt is also offered choice of unit snacks mid-morning and mid-afternoon.  Pt is eating as desired.   If additional nutrition issues arise, please consult RD.    Caellum Mancil, MS, RD, LDN, CNSC Inpatient Clinical Dietitian Pager # [phone] After hours/weekend pager # [phone]     

## 2016-10-16 NOTE — [facility] Group Notes (Signed)
LCSW Group Therapy Note  10/16/2016 1:15pm  Type of Therapy and Topic:  Group Therapy:  Feelings around Relapse and Recovery  Participation Level:  Active   Description of Group:    Patients in this group will discuss emotions they experience before and after a relapse. They will process how experiencing these feelings, or avoidance of experiencing them, relates to having a relapse. Facilitator will guide patients to explore emotions they have related to recovery. Patients will be encouraged to process which emotions are more powerful. They will be guided to discuss the emotional reaction significant others in their lives may have to their relapse or recovery. Patients will be assisted in exploring ways to respond to the emotions of others without this contributing to a relapse.  Therapeutic Goals: 1. Patient will identify two or more emotions that lead to a relapse for them 2. Patient will identify two emotions that result when they relapse 3. Patient will identify two emotions related to recovery 4. Patient will demonstrate ability to communicate their needs through discussion and/or role plays   Summary of Patient Progress: Pt reports that she often drinks so she will not feel certain emotions. Pt reports that she is motivated for treatment because she knows she will die if she does not stop drinking.    Therapeutic Modalities:   Cognitive Behavioral Therapy Solution-Focused Therapy Assertiveness Training Relapse Prevention Therapy   Verdene LennertLauren C Jazira Maloney, LCSW 10/16/2016 2:47 PM

## 2016-10-16 NOTE — Progress Notes (Signed)
[facility] Group Notes:  (Nursing/MHT/Case Management/Adjunct)  Date:  10/16/2016  Time:  10:12 PM  Type of Therapy:  Psychoeducational Skills  Participation Level:  Active  Participation Quality:  Appropriate  Affect:  Appropriate  Cognitive:  Appropriate  Insight:  Improving  Engagement in Group:  Improving  Modes of Intervention:  Education  Summary of Progress/Problems: Patient expressed in group that she both slept and ate well today. She also expressed that she was accepted into a treatment program but that she may have to wait until Monday to be discharged. She would like to go home on Sunday so that she can pack up her belongings. In terms of the theme for the day, her coping skills will be as follows: prayer, talking to family, and talking to staff.   Alicia Porter S 10/16/2016, 10:12 PM

## 2016-10-16 NOTE — Progress Notes (Signed)
Per [facility], Pt has been accepted for treatment on Monday 9/10.   Alicia ShanksLauren Tamee Battin, LCSW Clinical Social Work [phone]

## 2016-10-16 NOTE — Progress Notes (Signed)
D    Pt is pleasant on approach and cooperative   She reports feeling excited and hopeful to be able to attend a rehab program for 28 days but feels like she needs to be discharged Sunday instead of Monday so she can pack her bags   She verbalizes being invested in remaining sober  A    Verbal support given   Medications administered and effectiveness monitored    Q 15 min checks    R   Pt is safe at present time

## 2016-10-16 NOTE — Progress Notes (Signed)
D: Patient denies SI/HI or AVH. Patient has a depressed mood and flat affect.  She is visualized in the dayroom interacting with staff and others on the unit.  Pt. States she is excited and nervous about her acceptance [facility].  She did request medication to assist with her anxiety is looking forward to speaking with that facility.  Pt. Denies any other physical complaints.    A: Patient given emotional support from RN. Patient encouraged to come to staff with concerns and/or questions. Patient's medication routine continued. Patient's orders and plan of care reviewed.   R: Patient remains appropriate and cooperative. Will continue to monitor patient q15 minutes for safety.

## 2016-10-16 NOTE — Progress Notes (Signed)
Pt reports she is doing better and presents with a brighter affect this evening.  She says that her outlook is more positive, and that she has decided that no matter how long the process takes to get her into the rehab, she is not going to let it discourage her.  She denies SI/HI/AVH.  She has been appropriate and makes her needs known to staff.  Support and encouragement offered.  Discharge plans are in process.  Pt still wants to get into [facility] for additional therapy.  Safety maintained on the unit with q15 minute checks.

## 2016-10-17 NOTE — Progress Notes (Addendum)
D Patient is observed UAL OOB on the 400 hall today...she tolerates this well. She reponds quickly, briskly and actually makes eye contact with this Clinical research associatewriter when  She comes to med window for her morning medication. She says " I'm doing a lot better today", in response to writer asking her how she is doing.She endorses a flat, blunted affect.  A Writer observed her swallow her medicaiton and then she says " I really need to be discharged tomorrow.. I need  time to go to my house, pack and then get to the place on time .[facility]. on MOnday". She denied SI today and she rated her depression, anxiety and hopelessness anda rated them " 3-5//3", respectively. Pt relayed how difficult it is for her to keep up with her medications at home and writer encouraged ehr to use a med box, as a way of assuring she has taken them at the right time in the correct dosages. Pt stated she understood this process and that she thinks it will help her and says " I think I'll try it." R Pt safe and therapeutic relationship is fostered.

## 2016-10-17 NOTE — Progress Notes (Signed)
[facility] MD Progress Note  10/17/2016 1:12 PM Alicia Porter  MRN:  [id number]   Subjective:  Patient states that she is feeling better today and ready to be discharged tomorrow so she can go to the rehab facility on Monday. She denies any SI/HI/AVH and smiles when talking about discharge. 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  Objective: She is pleasant and cooperative. She has a better affect today and smiles. Patient is stabilized and can go to a treatment facility for long term treatment as soon as a bed is open. Will plan discharge for tomorrow.  Principal Problem: Major depressive disorder, recurrent episode, severe, with psychotic behavior (HCC) Diagnosis:   Patient Active Problem List   Diagnosis Date Noted  . Alcohol use disorder (HCC) [F10.99] 10/10/2016  . Major depressive disorder, recurrent episode, severe, with psychosis (HCC) [F33.3] 10/09/2016  . Alcohol dependence with withdrawal, uncomplicated (HCC) [F10.230] 10/09/2016  . Major depressive disorder, recurrent episode, severe, with psychotic behavior (HCC) [F33.3] 10/09/2016  . Obstructive sleep apnea of adult [G47.33] 04/13/2014  . Sleep onset Insomnia [G47.00] 04/13/2014  . Persistent disorder of initiating or maintaining sleep [G47.00] 04/13/2014  . Parasomnia [G47.50] 04/13/2014  . Restless leg syndrome [G25.81] 04/13/2014  . CPAP (continuous positive airway pressure) dependence [Z99.89] 04/13/2014  . Bruxism [F45.8] 04/13/2014  . PCOS (polycystic ovarian syndrome) [E28.2] 12/09/2012  . Encounter for long-term (current) use of other medications [Z79.899] 12/09/2012  . Tobacco use disorder [F17.200] 12/09/2012  . Type II or unspecified type diabetes mellitus without mention of complication, not stated as uncontrolled [E11.9] 12/09/2012  . Major depressive disorder, recurrent (HCC) [F33.9] 01/24/2011  . Generalized anxiety disorder [F41.1] 01/24/2011  .  Borderline personality disorder [F60.3] 01/24/2011   Total Time spent with patient: 15 minutes  Past Psychiatric History: See H&P  Past Medical History:  Past Medical History:  Diagnosis Date  . Depression   . Diabetes mellitus without complication (HCC)   . Hyperlipidemia   . Polycystic ovarian disease   . Polycystic ovarian disease 09/08/2012   takes Aldactone for it  . Sleep apnea    History reviewed. No pertinent surgical history. Family History:  Family History  Problem Relation Age of Onset  . Heart disease Mother   . Diabetes Mother   . Hyperlipidemia Mother    Family Psychiatric  History: See H&P Social History:  History  Alcohol Use  . Yes    Comment: last drink 09/29/16     History  Drug Use No    Social History   Social History  . Marital status: Single    Spouse name: N/A  . Number of children: N/A  . Years of education: N/A   Social History Main Topics  . Smoking status: Current Every Day Smoker    Packs/day: 1.00  . Smokeless tobacco: Never Used  . Alcohol use Yes     Comment: last drink 09/29/16  . Drug use: No  . Sexual activity: Yes   Other Topics Concern  . None   Social History Narrative  . None   Additional Social History:      Sleep: Fair  Appetite:  Good  Current Medications: Current Facility-Administered Medications  Medication Dose Route Frequency Provider Last Rate Last Dose  . acetaminophen (TYLENOL) tablet 650 mg  650 mg Oral Q4H PRN Charm Rings, NP      . alum & mag hydroxide-simeth (MAALOX/MYLANTA)  200-200-20 MG/5ML suspension 30 mL  30 mL Oral Q6H PRN Charm RingsLord, Jamison Y, NP      . ARIPiprazole (ABILIFY) tablet 2 mg  2 mg Oral Daily Cobos, Rockey SituFernando A, MD   2 mg at 10/17/16 0824  . citalopram (CELEXA) tablet 20 mg  20 mg Oral Daily Charm RingsLord, Jamison Y, NP   20 mg at 10/17/16 0824  . gabapentin (NEURONTIN) capsule 200 mg  200 mg Oral BID Cobos, Rockey SituFernando A, MD   200 mg at 10/17/16 0823  . gabapentin (NEURONTIN) capsule 600 mg   600 mg Oral QHS Money, Gerlene Burdockravis B, FNP   600 mg at 10/16/16 2104  . hydrOXYzine (ATARAX/VISTARIL) tablet 25 mg  25 mg Oral TID PRN Charm RingsLord, Jamison Y, NP   25 mg at 10/17/16 1251  . magnesium hydroxide (MILK OF MAGNESIA) suspension 30 mL  30 mL Oral Daily PRN Charm RingsLord, Jamison Y, NP   30 mL at 10/15/16 0819  . naltrexone (DEPADE) tablet 50 mg  50 mg Oral Daily Georgiann CockerIzediuno, Vincent A, MD   50 mg at 10/17/16 0824  . nicotine (NICODERM CQ - dosed in mg/24 hours) patch 21 mg  21 mg Transdermal Daily Charm RingsLord, Jamison Y, NP   21 mg at 10/17/16 [id number]  . ondansetron (ZOFRAN) tablet 4 mg  4 mg Oral Q8H PRN Charm RingsLord, Jamison Y, NP      . traZODone (DESYREL) tablet 100 mg  100 mg Oral QHS Money, Gerlene Burdockravis B, FNP   100 mg at 10/16/16 2104    Lab Results: No results found for this or any previous visit (from the past 48 hour(s)).  Blood Alcohol level:  Lab Results  Component Value Date   ETH <5 10/08/2016   ETH 126 (H) 10/04/2013    Metabolic Disorder Labs: Lab Results  Component Value Date   HGBA1C 5.6 10/12/2016   MPG 114.02 10/12/2016   Lab Results  Component Value Date   PROLACTIN 42.8 (H) 10/12/2016   Lab Results  Component Value Date   CHOL 149 10/12/2016   TRIG 135 10/12/2016   HDL 26 (L) 10/12/2016   CHOLHDL 5.7 10/12/2016   VLDL 27 10/12/2016   LDLCALC 96 10/12/2016   LDLCALC 39 08/08/2013    Physical Findings: AIMS: Facial and Oral Movements Muscles of Facial Expression: None, normal Lips and Perioral Area: None, normal Jaw: None, normal Tongue: None, normal,Extremity Movements Upper (arms, wrists, hands, fingers): None, normal Lower (legs, knees, ankles, toes): None, normal, Trunk Movements Neck, shoulders, hips: None, normal, Overall Severity Severity of abnormal movements (highest score from questions above): None, normal Incapacitation due to abnormal movements: None, normal Patient's awareness of abnormal movements (rate only patient's report): No Awareness, Dental Status Current  problems with teeth and/or dentures?: No Does patient usually wear dentures?: No  CIWA:  CIWA-Ar Total: 3 COWS:     Musculoskeletal: Strength & Muscle Tone: within normal limits Gait & Station: normal Patient leans: N/A  Psychiatric Specialty Exam: Physical Exam  Vitals reviewed. Constitutional: She is oriented to person, place, and time. She appears well-developed and well-nourished.  Cardiovascular: Normal rate.   Respiratory: Effort normal.  Musculoskeletal: Normal range of motion.  Neurological: She is alert and oriented to person, place, and time.    Review of Systems  Constitutional: Negative.   HENT: Negative.   Respiratory: Negative.   Cardiovascular: Negative.   Gastrointestinal: Negative.   Genitourinary: Negative.   Musculoskeletal: Negative.   Skin: Negative.   Neurological: Negative.   Endo/Heme/Allergies: Negative.  Blood pressure (!) 89/52, pulse 88, temperature 98.3 F (36.8 C), resp. rate 16, height  (1.676 m), weight 101.6 kg (224 lb).Body mass index is 36.15 kg/m.  General Appearance: Casual  Eye Contact:  Good  Speech:  Clear and Coherent and Normal Rate  Volume:  Normal  Mood:  Depressed  Affect:  Appropriate  Thought Process:  Coherent and Descriptions of Associations: Intact  Orientation:  Full (Time, Place, and Person)  Thought Content:  WDL  Suicidal Thoughts:  No  Homicidal Thoughts:  No  Memory:  Immediate;   Good Recent;   Good  Judgement:  Fair  Insight:  Good  Psychomotor Activity:  Normal  Concentration:  Concentration: Good and Attention Span: Good  Recall:  Good  Fund of Knowledge:  Good  Language:  Good  Akathisia:  No  Handed:  Right  AIMS (if indicated):     Assets:  Desire for Improvement Financial Resources/Insurance Housing Social Support Transportation  ADL's:  Intact  Cognition:  WNL  Sleep:  Number of Hours: 6.5     Treatment Plan Summary: Daily contact with patient to assess and evaluate symptoms  and progress in treatment, Medication management and Plan is to:   -Continue Abilify 2 mg PO Daily for mood stability -Continue Neurontin 200 mg PO BID and 600 mg PO QHS for withdrawal symptoms and mood control -Continue Naltrexone 50 mg PO Daily for withdrawal -Contineu Trazodone 100 mg PO QHS for insomnia -Continue Vistaril 25 mg PO TID PRN for anxiety -Continue Celexa 20 mg PO Daily for mood stability -Encourage group therapy participation   Maryfrances Bunnell, FNP 10/17/2016, 1:12 PM

## 2016-10-17 NOTE — Progress Notes (Signed)
D    Pt has isolated to her room the entire shift   She has been in her bed with the cpap machine on   She woke up to get snack    She appears more depressed than yesterday but denies same  A   Verbal support given   Medications administered and effectiveness monitored   Q 15 min checks  R   Pt is safe at present time

## 2016-10-17 NOTE — Progress Notes (Signed)
Psychoeducational Group Note  Date:  10/17/2016 Time:  2225  Group Topic/Focus:  Wrap-Up Group:   The focus of this group is to help patients review their daily goal of treatment and discuss progress on daily workbooks.  Participation Level: Did Not Attend  Participation Quality:  Not Applicable  Affect:  Not Applicable  Cognitive:  Not Applicable  Insight:  Not Applicable  Engagement in Group: Not Applicable  Additional Comments:  Patient did not attend group this evening.   Hazle CocaGOODMAN, Kynlea Blackston S 10/17/2016, 10:25 PM

## 2016-10-17 NOTE — [facility] Group Notes (Signed)
[facility] LCSW Group Therapy Note  Date/Time:    10/17/2016 10:00-11:00AM  Type of Therapy and Topic:  Group Therapy:  Healthy vs Unhealthy Coping Skills  Participation Level:  Did Not Attend   Description of Group:  The focus of this group was to determine what unhealthy coping techniques typically are used by group members and what healthy coping techniques would be helpful in coping with various problems. Patients were guided in becoming aware of the differences between healthy and unhealthy coping techniques.  "Benefits" and "Costs" of a number of coping skills were evaluated by the group, including isolation, cutting, drinking/using drugs, exercising, talking things out, and taking out anger on a pillow.    Therapeutic Goals 1. Patients learned that coping is what human beings do all day long to deal with various situations in their lives 2. Patients defined and discussed healthy vs unhealthy coping techniques 3. Patients identified their preferred coping techniques and identified whether these were healthy or unhealthy 4. Patients provided support and ideas to each other  Summary of Patient Progress: N/A   Therapeutic Modalities Cognitive Behavioral Therapy Motivational Interviewing   Ambrose MantleMareida Grossman-Orr, LCSW 10/17/2016, 3:40 PM

## 2016-10-17 NOTE — [facility] Group Notes (Signed)
   Date:  10/17/2016  Time:  1100  Type of Therapy:  Nurse Education  /  The group focuses on teaching patients how to identify their needs as well as how to develop the skills needed to get them met.   Participation Level:  Active  Participation Quality:  Attentive  Affect:  Anxious  Cognitive:  Appropriate  Insight:  Improving  Engagement in Group:  Engaged  Modes of Intervention:  Education  Summary of Progress/Problems:  Alicia Porter 10/17/2016, 3:12 PM

## 2016-10-18 DIAGNOSIS — F10239 Alcohol dependence with withdrawal, unspecified: Secondary | ICD-10-CM

## 2016-10-18 MED ORDER — NALTREXONE HCL 50 MG PO TABS: 50.0000 mg | ORAL_TABLET | Freq: Every day | ORAL | 0 refills | Status: DC

## 2016-10-18 MED ORDER — ARIPIPRAZOLE 2 MG PO TABS: 2.0000 mg | ORAL_TABLET | Freq: Every day | ORAL | 0 refills | Status: DC

## 2016-10-18 MED ORDER — GABAPENTIN 100 MG PO CAPS: 200.0000 mg | ORAL_CAPSULE | Freq: Two times a day (BID) | ORAL | 0 refills | Status: DC

## 2016-10-18 MED ORDER — CITALOPRAM HYDROBROMIDE 20 MG PO TABS: 20.0000 mg | ORAL_TABLET | Freq: Every day | ORAL | 0 refills | Status: DC

## 2016-10-18 MED ORDER — TRAZODONE HCL 100 MG PO TABS: 100.0000 mg | ORAL_TABLET | Freq: Every day | ORAL | 0 refills | Status: DC

## 2016-10-18 MED ORDER — HYDROXYZINE HCL 25 MG PO TABS: 25.0000 mg | ORAL_TABLET | Freq: Three times a day (TID) | ORAL | 0 refills | Status: DC | PRN

## 2016-10-18 MED ORDER — GABAPENTIN 300 MG PO CAPS: 600.0000 mg | ORAL_CAPSULE | Freq: Every day | ORAL | 0 refills | Status: DC

## 2016-10-18 NOTE — Discharge Summary (Signed)
Physician Discharge Summary Note  Patient:  Alicia Porter is an 46 y.o., female MRN:  [id number] DOB:  10/08/1970 Patient phone:  [phone] (home)  Patient address:   [address] [place] [id number],  Total Time spent with patient: 20 minutes  Date of Admission:  10/09/2016 Date of Discharge: 10/18/16   Reason for Admission:  ETOH abuse, depression with SI  Principal Problem: Major depressive disorder, recurrent episode, severe, with psychotic behavior [facility](HCC) Discharge Diagnoses: Patient Active Problem List   Diagnosis Date Noted  . Alcohol use disorder (HCC) [F10.99] 10/10/2016  . Major depressive disorder, recurrent episode, severe, with psychosis (HCC) [F33.3] 10/09/2016  . Alcohol dependence with withdrawal, uncomplicated (HCC) [F10.230] 10/09/2016  . Major depressive disorder, recurrent episode, severe, with psychotic behavior (HCC) [F33.3] 10/09/2016  . Obstructive sleep apnea of adult [G47.33] 04/13/2014  . Sleep onset Insomnia [G47.00] 04/13/2014  . Persistent disorder of initiating or maintaining sleep [G47.00] 04/13/2014  . Parasomnia [G47.50] 04/13/2014  . Restless leg syndrome [G25.81] 04/13/2014  . CPAP (continuous positive airway pressure) dependence [Z99.89] 04/13/2014  . Bruxism [F45.8] 04/13/2014  . PCOS (polycystic ovarian syndrome) [E28.2] 12/09/2012  . Encounter for long-term (current) use of other medications [Z79.899] 12/09/2012  . Tobacco use disorder [F17.200] 12/09/2012  . Type II or unspecified type diabetes mellitus without mention of complication, not stated as uncontrolled [E11.9] 12/09/2012  . Major depressive disorder, recurrent (HCC) [F33.9] 01/24/2011  . Generalized anxiety disorder [F41.1] 01/24/2011  . Borderline personality disorder [F60.3] 01/24/2011    Past Psychiatric History: MDD, Borderline, ETOH abuse, GAD  Past Medical History:  Past Medical History:  Diagnosis Date  . Depression   . Diabetes mellitus without  complication (HCC)   . Hyperlipidemia   . Polycystic ovarian disease   . Polycystic ovarian disease 09/08/2012   takes Aldactone for it  . Sleep apnea    History reviewed. No pertinent surgical history. Family History:  Family History  Problem Relation Age of Onset  . Heart disease Mother   . Diabetes Mother   . Hyperlipidemia Mother    Family Psychiatric  History: Denies Social History:  History  Alcohol Use  . Yes    Comment: last drink 09/29/16     History  Drug Use No    Social History   Social History  . Marital status: Single    Spouse name: N/A  . Number of children: N/A  . Years of education: N/A   Social History Main Topics  . Smoking status: Current Every Day Smoker    Packs/day: 1.00  . Smokeless tobacco: Never Used  . Alcohol use Yes     Comment: last drink 09/29/16  . Drug use: No  . Sexual activity: Yes   Other Topics Concern  . None   Social History Narrative  . None    Hospital Course:   46 y.o Caucasian female, single, lives alone, no kids. 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 Worried she is likely going to lose her license. Background history of childhood trauma, AUD and unspecified mood disorder. Has been drinking since her mid teens. Her mom was visiting from [place] and encouraged her to come to the hospital. Reportsworsening suicidal thoughts. Expressed thoughts of blowing her own brain off with a gun. Has attempted to drown self in the past. Expressed feelings as she is being watched. This makes her feel paranoid. Last drink was four days ago. Routine labs are essientially normal.  Says she was forced to leave her job  about a week ago. She has been drinking more since then. Says she just has this feeling as if something terrible is about to happen to her. Has negative interpretation of neurtral events. She applied for FMLA but not sure if it would be honored. Overwhelmed with recent turn of events. Denies any other stressors. Reports past suicidal  behavior. Got drunk while in the bath tub. Expected to drown self in the process. Has had past addiction treatment. First at age of 48 years. Second was three years ago. Did ninety days. Has had past DUI's and arrests while intoxicated. Past periods of depression and hypomania in context of substance use. She has been tried on multiple medications by her psychiatrist. Recently started on Citalopram. Has consented to use of naltrexone. Withdrawal symptoms are less. No sweatiness, no headaches. No retching, nausea or vomiting. No fullness in the head. No visual, tactile or auditory hallucination. No internal restlessness. No homicidal thoughts. No thoughts of violence.  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 She was started on Abilify 2 mg PO Daily, Gabapentin 200 mg PO BID and 600 mg PO QHS, and Naltrexone 50 mg PO Daily. She was also provided Trazodone and Vistaril PRN. She went through detox protocol while here as well. She was requesting a residential program and plans to go to [facility] tomorrow for treatment. Patient stabilized in 8 days here on the medications and she was seen attending groups and interacting with others appropriately. She did keep somewhat of a flat affect. She continually denies SI/HI/AVH throughout stay.   Physical Findings: AIMS: Facial and Oral Movements Muscles of Facial Expression: None, normal Lips and Perioral Area: None, normal Jaw: None, normal Tongue: None, normal,Extremity Movements Upper (arms, wrists, hands, fingers): None, normal Lower (legs, knees, ankles, toes): None, normal, Trunk Movements Neck, shoulders, hips: None, normal, Overall Severity Severity of abnormal movements (highest score from questions above): None, normal Incapacitation due to abnormal movements: None, normal Patient's awareness of abnormal movements (rate only patient's report): No Awareness, Dental  Status Current problems with teeth and/or dentures?: No Does patient usually wear dentures?: No  CIWA:  CIWA-Ar Total: 3 COWS:     Musculoskeletal: Strength & Muscle Tone: within normal limits Gait & Station: normal Patient leans: N/A  Psychiatric Specialty Exam: Physical Exam  Nursing note and vitals reviewed. Constitutional: She is oriented to person, place, and time. She appears well-developed and well-nourished.  Cardiovascular: Normal rate.   Respiratory: Effort normal.  Musculoskeletal: Normal range of motion.  Neurological: She is alert and oriented to person, place, and time.  Skin: Skin is warm.    Review of Systems  Constitutional: Negative.   HENT: Negative.   Eyes: Negative.   Respiratory: Negative.   Cardiovascular: Negative.   Gastrointestinal: Negative.   Genitourinary: Negative.   Musculoskeletal: Negative.   Skin: Negative.   Neurological: Negative.   Endo/Heme/Allergies: Negative.     Blood pressure (!) 88/66, pulse 97, temperature 98.3 F (36.8 C), resp. rate 16, height  (1.676 m), weight 101.6 kg (224 lb).Body mass index is 36.15 kg/m.  General Appearance: Casual  Eye Contact:  Good  Speech:  Clear and Coherent and Normal Rate  Volume:  Normal  Mood:  She i seuthymic about going home but a little anxious about her emplyment   Affect:  Flat  Thought Process:  Coherent and Descriptions of Associations: Intact  Orientation:  Full (Time, Place, and Person)  Thought Content:  WDL  Suicidal Thoughts:  No  Homicidal Thoughts:  No  Memory:  Immediate;   Good Recent;   Good Remote;   Good  Judgement:  Good  Insight:  Good  Psychomotor Activity:  Normal  Concentration:  Concentration: Good and Attention Span: Good  Recall:  Good  Fund of Knowledge:  Good  Language:  Good  Akathisia:  No  Handed:  Right  AIMS (if indicated):     Assets:  Desire for Improvement Financial Resources/Insurance Housing Resilience Social  Support Transportation Vocational/Educational  ADL's:  Intact  Cognition:  WNL  Sleep:  Number of Hours: 6.5     Have you used any form of tobacco in the last 30 days? (Cigarettes, Smokeless Tobacco, Cigars, and/or Pipes): Yes  Has this patient used any form of tobacco in the last 30 days? (Cigarettes, Smokeless Tobacco, Cigars, and/or Pipes) Yes, Yes, A prescription for an FDA-approved tobacco cessation medication was offered at discharge and the patient refused  Blood Alcohol level:  Lab Results  Component Value Date   ETH <5 10/08/2016   ETH 126 (H) 10/04/2013    Metabolic Disorder Labs:  Lab Results  Component Value Date   HGBA1C 5.6 10/12/2016   MPG 114.02 10/12/2016   Lab Results  Component Value Date   PROLACTIN 42.8 (H) 10/12/2016   Lab Results  Component Value Date   CHOL 149 10/12/2016   TRIG 135 10/12/2016   HDL 26 (L) 10/12/2016   CHOLHDL 5.7 10/12/2016   VLDL 27 10/12/2016   LDLCALC 96 10/12/2016   LDLCALC 39 08/08/2013    See Psychiatric Specialty Exam and Suicide Risk Assessment completed by Attending Physician prior to discharge.  Discharge destination:  Home  Is patient on multiple antipsychotic therapies at discharge:  No   Has Patient had three or more failed trials of antipsychotic monotherapy by history:  No  Recommended Plan for Multiple Antipsychotic Therapies: NA   Allergies as of 10/18/2016      Reactions   Levofloxacin Other (See Comments)   Tendon damage   Sulfa Antibiotics Nausea And Vomiting   Wellbutrin [bupropion Hcl] Other (See Comments)   "Crawling out of my skin"      Medication List    STOP taking these medications   atorvastatin 40 MG tablet Commonly known as:  LIPITOR   eletriptan 40 MG tablet Commonly known as:  RELPAX   meloxicam 15 MG tablet Commonly known as:  MOBIC   metFORMIN 500 MG 24 hr tablet Commonly known as:  GLUCOPHAGE-XR   spironolactone 50 MG tablet Commonly known as:  ALDACTONE     TAKE  these medications     Indication  ARIPiprazole 2 MG tablet Commonly known as:  ABILIFY Take 1 tablet (2 mg total) by mouth daily. For mood control  Indication:  mood stability   citalopram 20 MG tablet Commonly known as:  CELEXA Take 1 tablet (20 mg total) by mouth daily. For mood control  Indication:  mood stability   gabapentin 300 MG capsule Commonly known as:  NEURONTIN Take 2 capsules (600 mg total) by mouth at bedtime. Withdrawal symptoms  Indication:  withdrawal symptoms   gabapentin 100 MG capsule Commonly known as:  NEURONTIN Take 2 capsules (200 mg total) by mouth 2 (two) times daily. Withdrawal symptoms  Indication:  withdrawal symptoms   hydrOXYzine 25 MG tablet Commonly known as:  ATARAX/VISTARIL Take 1 tablet (25 mg total) by mouth 3 (  three) times daily as needed for anxiety.  Indication:  Feeling Anxious   naltrexone 50 MG tablet Commonly known as:  DEPADE Take 1 tablet (50 mg total) by mouth daily. Alcohol dependence  Indication:  Excessive Use of Alcohol   traZODone 100 MG tablet Commonly known as:  DESYREL Take 1 tablet (100 mg total) by mouth at bedtime. sleep  Indication:  Trouble Sleeping      Follow-up Information    [facility] Follow up on 10/19/2016.   Why:  Please present for admission on this date at 3:00pm for continued treatment.  Contact information: [address] [place], [place] [address] [phone] (fax)          Follow-up recommendations:  Continue activity as tolerated. Continue diet as recommended by your PCP. Ensure to keep all appointments with outpatient providers.  Comments:  Patient is instructed prior to discharge to: Take all medications as prescribed by his/her mental healthcare provider. Report any adverse effects and or reactions from the medicines to his/her outpatient provider promptly. Patient has been instructed & cautioned: To not engage in alcohol and or illegal drug use while on prescription  medicines. In the event of worsening symptoms, patient is instructed to call the crisis hotline, 911 and or go to the nearest ED for appropriate evaluation and treatment of symptoms. To follow-up with his/her primary care provider for your other medical issues, concerns and or health care needs.    Signed: Gerlene Burdock Rajah Tagliaferro, FNP 10/18/2016, 9:01 AM

## 2016-10-18 NOTE — Progress Notes (Signed)
  [facility] Discharge [facility] :  Will you be returning to the same living situation after discharge:  No.  Going to rehab At discharge, do you have transportation home?: Yes,  family Do you have the ability to pay for your medications: Yes,  states there are no barriers  Release of information consent forms completed and turned in to Medical Records.  Patient to Follow up at: Follow-up Information    [facility] Follow up on 10/19/2016.   Why:  Please present for admission on this date at 3:00pm for continued treatment.  Contact information: [address] [place], [place] [address] [phone] (fax)          Next level of care provider has access to [facility] Link:no  Safety Planning and Suicide Prevention discussed: Yes,  with patient and mother  Have you used any form of tobacco in the last 30 days? (Cigarettes, Smokeless Tobacco, Cigars, and/or Pipes): Yes  Has patient been referred to the Quitline?: Patient refused referral  Patient has been referred for addiction treatment: Yes  Lynnell ChadMareida J Grossman-Orr, LCSW 10/18/2016, 8:32 AM

## 2016-10-18 NOTE — [facility] Suicide Risk Assessment (Signed)
[facility] Discharge Suicide Risk Assessment   Principal Problem: Major depressive disorder, recurrent episode, severe, with psychotic behavior [facility]) Discharge Diagnoses:  Patient Active Problem List   Diagnosis Date Noted  . Alcohol use disorder (HCC) [F10.99] 10/10/2016  . Major depressive disorder, recurrent episode, severe, with psychosis (HCC) [F33.3] 10/09/2016  . Alcohol dependence with withdrawal, uncomplicated (HCC) [F10.230] 10/09/2016  . Major depressive disorder, recurrent episode, severe, with psychotic behavior (HCC) [F33.3] 10/09/2016  . Obstructive sleep apnea of adult [G47.33] 04/13/2014  . Sleep onset Insomnia [G47.00] 04/13/2014  . Persistent disorder of initiating or maintaining sleep [G47.00] 04/13/2014  . Parasomnia [G47.50] 04/13/2014  . Restless leg syndrome [G25.81] 04/13/2014  . CPAP (continuous positive airway pressure) dependence [Z99.89] 04/13/2014  . Bruxism [F45.8] 04/13/2014  . PCOS (polycystic ovarian syndrome) [E28.2] 12/09/2012  . Encounter for long-term (current) use of other medications [Z79.899] 12/09/2012  . Tobacco use disorder [F17.200] 12/09/2012  . Type II or unspecified type diabetes mellitus without mention of complication, not stated as uncontrolled [E11.9] 12/09/2012  . Major depressive disorder, recurrent (HCC) [F33.9] 01/24/2011  . Generalized anxiety disorder [F41.1] 01/24/2011  . Borderline personality disorder [F60.3] 01/24/2011    Total Time spent with patient: 30 minutes  Musculoskeletal: Strength & Muscle Tone: within normal limits Gait & Station: normal Patient leans: Right  Psychiatric Specialty Exam: ROS  Blood pressure (!) 88/66, pulse 97, temperature 98.3 F (36.8 C), resp. rate 16, height  (1.676 m), weight 101.6 kg (224 lb).Body mass index is 36.15 kg/m.  General Appearance: Casual  Eye Contact::  Fair  Speech:  Slow409  Volume:  Normal  Mood:  Anxious  Affect:  Constricted  Thought Process:  Goal Directed   Orientation:  Full (Time, Place, and Person)  Thought Content:  Rumination  Suicidal Thoughts:  No  Homicidal Thoughts:  No  Memory:  Immediate;   Good Recent;   Good Remote;   Good  Judgement:  Good  Insight:  Good  Psychomotor Activity:  Normal  Concentration:  Fair  Recall:  Good  Fund of Knowledge:Good  Language: Good  Akathisia:  No  Handed:  Right  AIMS (if indicated):     Assets:  Communication Skills Desire for Improvement Resilience Social Support  Sleep:  Number of Hours: 6.5  Cognition: WNL  ADL's:  Intact   Mental Status Per Nursing Assessment::   On Admission:     Demographic Factors:  Caucasian  Loss Factors: Decrease in vocational status  Historical Factors: Prior suicide attempts and Impulsivity  Risk Reduction Factors:   Sense of responsibility to family, Religious beliefs about death, Living with another person, especially a relative, Positive social support, Positive therapeutic relationship and Positive coping skills or problem solving skills  Continued Clinical Symptoms:  Depression:   Impulsivity Alcohol/Substance Abuse/Dependencies  Cognitive Features That Contribute To Risk:  None    Suicide Risk:  Minimal: No identifiable suicidal ideation.  Patients presenting with no risk factors but with morbid ruminations; may be classified as minimal risk based on the severity of the depressive symptoms  Follow-up Information    [facility] Follow up on 10/19/2016.   Why:  Please present for admission on this date at 3:00pm for continued treatment.  Contact information: [address] [place], [place] [address] [phone] (fax)          Plan Of Care/Follow-up recommendations:  Patient is 46 year old Caucasian female who was admitted because of worsening depression and having suicidal thoughts.  She was also drinking  heavily and requesting help.  Patient was hospitalized in treatment given.  She is doing much better.  She  still have guilt about her past and a grant that she lost her job but also she is hoping to get better and she is planning to get rehabilitation.  Her support system is her mother who came from [place].  Patient has good support system.  Currently she has no suicidal thoughts, paranoia or any hallucination.  She is taking her medication and reported no side effects.  She feels that she is ready to go home.  Please see discharge summary for more detailed plan of care. Activity:  as tolerated Diet:  nchanged from the past  Sueann Brownley T., MD 10/18/2016, 11:38 AM

## 2016-10-18 NOTE — Progress Notes (Signed)
D Patient is observed at medication window this morning at shift change and she is standing away from other patients. She has a worried expression on her face. She has one hand in her pants pocket, she looks like she has a question on her facial expression and when writer  Asks her what she is thinking about, she says " I'm worried they're going to come and get me at rehab". 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  She says she knows she was short with people, that there were patient family members who had made threatening comments to her while she was on duty and she remuniates about this. She is not clear exactly what she did and didn't do - at her job- before she was admitted here. She is convinced she is going to be arrested and / or lose her nursing license .  ASHe did complete her daily assessment and on this she wrote she denied SI today and she rated her depression, hopelessness and anxeity " 3/3/6", respectively. She spoke with Clinical research associatewriter and the extender and both has encouraged her to seek a job that she feels more comfortable in- that she won't be so pressured and anxious about. R DC planning cont for today.

## 2016-10-18 NOTE — Progress Notes (Signed)
Patient is given her dc instructions by this Clinical research associatewriter and she stated understanding and willingness to comply. She is then given cc of these ( AVS, SRA, transition record and SSP) ALl belongings in her locker are returned to her and then she was escorted to bldg entrance and dc'd per MD order.

## 2016-10-18 NOTE — [facility] Group Notes (Signed)
[facility] Group Notes: (Clinical Social Work)   10/18/2016      Type of Therapy:  Group Therapy   Participation Level:  Did Not Attend despite MHT prompting   Doreen Garretson Grossman-Orr, LCSW 10/18/2016, 1:39 PM     

## 2017-10-11 ENCOUNTER — Ambulatory Visit (HOSPITAL_COMMUNITY)
Admission: EM | Admit: 2017-10-11 | Discharge: 2017-10-11 | Disposition: A | Discharge status: Home / Self Care | Payer: Self-pay | Attending: Family Medicine | Admitting: Family Medicine

## 2017-10-11 ENCOUNTER — Encounter (HOSPITAL_COMMUNITY): Payer: Self-pay | Admitting: Emergency Medicine

## 2017-10-11 ENCOUNTER — Other Ambulatory Visit: Payer: Self-pay

## 2017-10-11 DIAGNOSIS — R42 Dizziness and giddiness: Secondary | ICD-10-CM

## 2017-10-11 MED ORDER — MECLIZINE HCL 12.5 MG PO TABS: 12.5000 mg | ORAL_TABLET | Freq: Three times a day (TID) | ORAL | 0 refills | Status: DC | PRN

## 2017-10-11 MED ORDER — ONDANSETRON 4 MG PO TBDP: 4.0000 mg | ORAL_TABLET | Freq: Three times a day (TID) | ORAL | 0 refills | Status: DC | PRN

## 2017-10-11 NOTE — ED Provider Notes (Signed)
[facility]    CSN: [id number] Arrival date & time: 10/11/17  0849     History   Chief Complaint Chief Complaint  Patient presents with  . Ear Problem    HPI Alicia Porter is a 47 y.o. female history of depression, hyperlipidemia, PCOS, sleep apnea presenting today for evaluation of dizziness.  Patient states that for the past 2 weeks she has had occasional episodes of feeling off balance.  These episodes have become more frequent, and feels as if she experiences this daily.  Notices sensations more so after turning towards the right side or standing up quickly.  States that she feels off balance and needs to hold onto something as if she were drunk.  Denies room spinning sensation.  Denies syncope, but occasionally does feel like she is going to need to sit down.  Has associated nausea.  Symptoms usually last for approximately 3 to 5 hours.  Denies any new medication changes.  Denies recent URI, does note she recently had an infected tooth on the right side and was treated for this.  Denies any vision changes.  He also notes that for the past year she has had tinnitus in her right ear which is led to hearing loss, but more recently denies any new hearing loss.  She has tried to do the Epley maneuver without relief.  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  HPI  Past Medical History:  Diagnosis Date  . Depression   . Diabetes mellitus without complication (HCC)   . Hyperlipidemia   . Polycystic ovarian disease   . Polycystic ovarian disease 09/08/2012   takes Aldactone for it  . Sleep apnea     Patient Active Problem List   Diagnosis Date Noted  . Alcohol use disorder 10/10/2016  . Major depressive disorder, recurrent episode, severe, with psychosis (HCC) 10/09/2016  . Alcohol dependence with withdrawal, uncomplicated (HCC) 10/09/2016  . Major depressive disorder, recurrent episode, severe, with psychotic  behavior (HCC) 10/09/2016  . Obstructive sleep apnea of adult 04/13/2014  . Sleep onset Insomnia 04/13/2014  . Persistent disorder of initiating or maintaining sleep 04/13/2014  . Parasomnia 04/13/2014  . Restless leg syndrome 04/13/2014  . CPAP (continuous positive airway pressure) dependence 04/13/2014  . Bruxism 04/13/2014  . PCOS (polycystic ovarian syndrome) 12/09/2012  . Encounter for long-term (current) use of other medications 12/09/2012  . Tobacco use disorder 12/09/2012  . Type II or unspecified type diabetes mellitus without mention of complication, not stated as uncontrolled 12/09/2012  . Major depressive disorder, recurrent (HCC) 01/24/2011  . Generalized anxiety disorder 01/24/2011  . Borderline personality disorder (HCC) 01/24/2011    Past Surgical History:  Procedure Laterality Date  . LAPAROTOMY    . THROAT SURGERY      OB History   None      Home Medications    Prior to Admission medications   Medication Sig Start Date End Date Taking? Authorizing Provider  cholecalciferol (VITAMIN D) 1000 units tablet Take 1,000 Units by mouth daily.   Yes [provider]  FLUoxetine (PROZAC) 40 MG capsule Take 40 mg by mouth daily.   Yes [provider]  NAPROXEN PO Take by mouth.   Yes [provider]  risperiDONE (RISPERDAL) 0.25 MG tablet Take 0.25 mg by mouth at bedtime.   Yes [provider]  meclizine (ANTIVERT) 12.5 MG tablet Take 1 tablet (12.5 mg total) by mouth 3 (three)  times daily as needed for dizziness or nausea. 10/11/17   Edith Groleau C, PA-C  ondansetron (ZOFRAN ODT) 4 MG disintegrating tablet Take 1 tablet (4 mg total) by mouth every 8 (eight) hours as needed for nausea or vomiting. 10/11/17   Dvontae Ruan, Junius Creamer, PA-C    Family History Family History  Problem Relation Age of Onset  . Heart disease Mother   . Diabetes Mother   . Hyperlipidemia Mother     Social History Social History   Tobacco Use  . Smoking  status: Current Every Day Smoker    Packs/day: 1.00  . Smokeless tobacco: Never Used  Substance Use Topics  . Alcohol use: Not Currently    Comment: last drink 09/29/16  . Drug use: No     Allergies   Levofloxacin; Sulfa antibiotics; and Wellbutrin [bupropion hcl]   Review of Systems Review of Systems  Constitutional: Negative for fatigue and fever.  HENT: Positive for congestion and tinnitus. Negative for ear pain, sinus pressure and sore throat.   Eyes: Negative for photophobia, pain and visual disturbance.  Respiratory: Negative for cough and shortness of breath.   Cardiovascular: Negative for chest pain.  Gastrointestinal: Negative for abdominal pain, nausea and vomiting.  Genitourinary: Negative for decreased urine volume and hematuria.  Musculoskeletal: Negative for myalgias, neck pain and neck stiffness.  Neurological: Positive for dizziness. Negative for syncope, facial asymmetry, speech difficulty, weakness, light-headedness, numbness and headaches.     Physical Exam Triage Vital Signs ED Triage Vitals  Enc Vitals Group     BP 10/11/17 0922 111/77     Pulse Rate 10/11/17 0922 78     Resp 10/11/17 0922 20     Temp 10/11/17 0922 97.9 F (36.6 C)     Temp Source 10/11/17 0922 Oral     SpO2 10/11/17 0922 96 %     Weight --      Height --      Head Circumference --      Peak Flow --      Pain Score 10/11/17 0917 0     Pain Loc --      Pain Edu? --      Excl. in GC? --    No data found.  Updated Vital Signs BP 111/77 (BP Location: Right Arm)   Pulse 78   Temp 97.9 F (36.6 C) (Oral)   Resp 20   SpO2 96%   Visual Acuity Right Eye Distance:   Left Eye Distance:   Bilateral Distance:    Right Eye Near:   Left Eye Near:    Bilateral Near:     Physical Exam  Constitutional: She is oriented to person, place, and time. She appears well-developed and well-nourished. No distress.  HENT:  Head: Normocephalic and atraumatic.  Bilateral ears without  tenderness to palpation of external auricle, tragus and mastoid, EAC's without erythema or swelling, TM's with good bony landmarks and cone of light. Non erythematous.  Swollen turbinates  Oral mucosa pink and moist, no tonsillar enlargement or exudate. Posterior pharynx patent and nonerythematous, no uvula deviation or swelling. Normal phonation.  Eyes: Pupils are equal, round, and reactive to light. Conjunctivae and EOM are normal.  Wearing glasses  Neck: Neck supple.  Cardiovascular: Normal rate and regular rhythm.  No murmur heard. Pulmonary/Chest: Effort normal and breath sounds normal. No respiratory distress.  Breathing comfortably at rest, CTABL, no wheezing, rales or other adventitious sounds auscultated  Abdominal: Soft. There is no tenderness.  Musculoskeletal: She  exhibits no edema.  Neurological: She is alert and oriented to person, place, and time.  Patient A&O x3, cranial nerves II-XII grossly intact, strength at shoulders, hips and knees 5/5, equal bilaterally, patellar reflex 1+ bilaterally. Normal Finger to nose, RAM and heel to shin. Negative Romberg and Pronator Drift. Gait without abnormality. Negative Dix-Hallpike  Skin: Skin is warm and dry.  Psychiatric: She has a normal mood and affect.  Nursing note and vitals reviewed.    UC Treatments / Results  Labs (all labs ordered are listed, but only abnormal results are displayed) Labs Reviewed - No data to display  EKG None  Radiology No results found.  Procedures Procedures (including critical care time)  Medications Ordered in UC Medications - No data to display  Initial Impression / Assessment and Plan / UC Course  I have reviewed the triage vital signs and the nursing notes.  Pertinent labs & imaging results that were available during my care of the patient were reviewed by me and considered in my medical decision making (see chart for details).     Patient with dizziness associated with quick  motions/head turning.  Negative Dix-Hallpike, no focal neuro deficits.  Symptoms correlate most closely with vertigo.  Seems more peripheral. vital signs stable.  Discussed with patient obtaining i-STAT, EKG, orthostatics, patient declined as her health insurance has not kicked in yet.  She is requesting to treat for vertigo and follow-up of symptoms changing.  Will provide meclizine, discuss sleepiness/drowsiness regarding meclizine advised not to drive or use with work.  Will try Zofran to help with nausea while working, educated on Teaching laboratory technician.  Follow-up with ENT if symptoms persisting, return here if symptoms worsening, changing or developing new symptoms.Discussed strict return precautions. Patient verbalized understanding and is agreeable with plan.  Final Clinical Impressions(s) / UC Diagnoses   Final diagnoses:  Dizziness     Discharge Instructions     Dizziness seems to be most likely from vertigo  Please perform Epley maneuver 5-6 times twice daily   Use meclizine as needed for dizziness/nausea- will cause sleepiness do not drive/work after use  May try zofran for nausea at work  Follow up with ENT if symptoms persisting  Please return here go to emergency room if developing worsening dizziness, lightheadedness, episodes of passing out, changes in vision, weakness, difficulty speaking, chest discomfort, shortness of breath, associated headache   ED Prescriptions    Medication Sig Dispense Auth. Provider   ondansetron (ZOFRAN ODT) 4 MG disintegrating tablet Take 1 tablet (4 mg total) by mouth every 8 (eight) hours as needed for nausea or vomiting. 20 tablet Alsha Meland C, PA-C   meclizine (ANTIVERT) 12.5 MG tablet Take 1 tablet (12.5 mg total) by mouth 3 (three) times daily as needed for dizziness or nausea. 30 tablet Keo Schirmer, [place] C, PA-C     Controlled Substance Prescriptions [place] Controlled Substance Registry consulted? Not Applicable   Lew Dawes,  [place] 10/11/17 1003

## 2017-10-11 NOTE — ED Triage Notes (Signed)
tinnitus in right ear for a year.   2 weeks ago started having dizziness and nausea, balance is off.  Patient says this lasts for hours.  This happens every day

## 2017-10-11 NOTE — Discharge Instructions (Signed)
Dizziness seems to be most likely from vertigo  Please perform Epley maneuver 5-6 times twice daily   Use meclizine as needed for dizziness/nausea- will cause sleepiness do not drive/work after use  May try zofran for nausea at work  Follow up with ENT if symptoms persisting  Please return here go to emergency room if developing worsening dizziness, lightheadedness, episodes of passing out, changes in vision, weakness, difficulty speaking, chest discomfort, shortness of breath, associated headache

## 2017-11-05 ENCOUNTER — Ambulatory Visit (HOSPITAL_COMMUNITY)
Admission: EM | Admit: 2017-11-05 | Discharge: 2017-11-05 | Disposition: A | Discharge status: Home / Self Care | Payer: Self-pay | Attending: Family Medicine | Admitting: Family Medicine

## 2017-11-05 ENCOUNTER — Other Ambulatory Visit: Payer: Self-pay

## 2017-11-05 ENCOUNTER — Ambulatory Visit (INDEPENDENT_AMBULATORY_CARE_PROVIDER_SITE_OTHER): Payer: Self-pay

## 2017-11-05 ENCOUNTER — Encounter (HOSPITAL_COMMUNITY): Payer: Self-pay | Admitting: Urgent Care

## 2017-11-05 DIAGNOSIS — F333 Major depressive disorder, recurrent, severe with psychotic symptoms: Secondary | ICD-10-CM | POA: Insufficient documentation

## 2017-11-05 DIAGNOSIS — Z79899 Other long term (current) drug therapy: Secondary | ICD-10-CM | POA: Insufficient documentation

## 2017-11-05 DIAGNOSIS — E119 Type 2 diabetes mellitus without complications: Secondary | ICD-10-CM | POA: Insufficient documentation

## 2017-11-05 DIAGNOSIS — E785 Hyperlipidemia, unspecified: Secondary | ICD-10-CM | POA: Insufficient documentation

## 2017-11-05 DIAGNOSIS — J209 Acute bronchitis, unspecified: Secondary | ICD-10-CM | POA: Insufficient documentation

## 2017-11-05 DIAGNOSIS — G4733 Obstructive sleep apnea (adult) (pediatric): Secondary | ICD-10-CM | POA: Insufficient documentation

## 2017-11-05 DIAGNOSIS — F17210 Nicotine dependence, cigarettes, uncomplicated: Secondary | ICD-10-CM | POA: Insufficient documentation

## 2017-11-05 DIAGNOSIS — E282 Polycystic ovarian syndrome: Secondary | ICD-10-CM | POA: Insufficient documentation

## 2017-11-05 DIAGNOSIS — G4700 Insomnia, unspecified: Secondary | ICD-10-CM | POA: Insufficient documentation

## 2017-11-05 DIAGNOSIS — F603 Borderline personality disorder: Secondary | ICD-10-CM | POA: Insufficient documentation

## 2017-11-05 DIAGNOSIS — R300 Dysuria: Secondary | ICD-10-CM | POA: Insufficient documentation

## 2017-11-05 DIAGNOSIS — G2581 Restless legs syndrome: Secondary | ICD-10-CM | POA: Insufficient documentation

## 2017-11-05 DIAGNOSIS — R102 Pelvic and perineal pain: Secondary | ICD-10-CM

## 2017-11-05 DIAGNOSIS — F411 Generalized anxiety disorder: Secondary | ICD-10-CM | POA: Insufficient documentation

## 2017-11-05 DIAGNOSIS — F10230 Alcohol dependence with withdrawal, uncomplicated: Secondary | ICD-10-CM | POA: Insufficient documentation

## 2017-11-05 LAB — POCT URINALYSIS DIP (DEVICE)
Glucose, UA: 100 mg/dL — AB
LEUKOCYTES UA: NEGATIVE
Nitrite: NEGATIVE
Protein, ur: 100 mg/dL — AB
Urobilinogen, UA: 1 mg/dL (ref 0.0–1.0)
pH: 5.5 (ref 5.0–8.0)

## 2017-11-05 MED ORDER — IPRATROPIUM-ALBUTEROL 0.5-2.5 (3) MG/3ML IN SOLN
RESPIRATORY_TRACT | Status: AC
  Filled 2017-11-05: qty 3

## 2017-11-05 MED ORDER — PREDNISONE 10 MG (21) PO TBPK: ORAL_TABLET | ORAL | 0 refills | Status: DC

## 2017-11-05 MED ORDER — ALBUTEROL SULFATE HFA 108 (90 BASE) MCG/ACT IN AERS: 1.0000 | INHALATION_SPRAY | Freq: Four times a day (QID) | RESPIRATORY_TRACT | 0 refills | Status: DC | PRN

## 2017-11-05 MED ORDER — IPRATROPIUM-ALBUTEROL 0.5-2.5 (3) MG/3ML IN SOLN
3.0000 mL | Freq: Once | RESPIRATORY_TRACT | Status: AC
  Administered 2017-11-05: 3 mL via RESPIRATORY_TRACT

## 2017-11-05 NOTE — ED Provider Notes (Signed)
[facility]    CSN: [id number] Arrival date & time: 11/05/17  1003     History   Chief Complaint Chief Complaint  Patient presents with  . vaginal pain    HPI Alicia Porter is a 47 y.o. female.   She is a 47 year old female who presents with approximately 5 days of cough, congestion.  She is having yellow sputum and shortness of breath at times.  Symptoms have been constant and worsening.  she has been using over-the-counter medication such as TheraFlu without much relief of symptoms.  She is a current everyday smoker 1 pack/day.  She does work in a assisted living facility.  She denies any fever but has had some associated body aches, chills an reports O2 sats been slightly low.  She denies any history of asthma or COPD.   She is also having some dysuria and vaginal burning x 1 day. She denies any vaginal discharge, vaginal bleeding, itching or irritation. Denies any abd pain, pelvic pain, back pain.   ROS per HPI        Past Medical History:  Diagnosis Date  . Depression   . Diabetes mellitus without complication (HCC)   . Hyperlipidemia   . Polycystic ovarian disease   . Polycystic ovarian disease 09/08/2012   takes Aldactone for it  . Sleep apnea     Patient Active Problem List   Diagnosis Date Noted  . Alcohol use disorder 10/10/2016  . Major depressive disorder, recurrent episode, severe, with psychosis (HCC) 10/09/2016  . Alcohol dependence with withdrawal, uncomplicated (HCC) 10/09/2016  . Major depressive disorder, recurrent episode, severe, with psychotic behavior (HCC) 10/09/2016  . Obstructive sleep apnea of adult 04/13/2014  . Sleep onset Insomnia 04/13/2014  . Persistent disorder of initiating or maintaining sleep 04/13/2014  . Parasomnia 04/13/2014  . Restless leg syndrome 04/13/2014  . CPAP (continuous positive airway pressure) dependence 04/13/2014  . Bruxism 04/13/2014  . PCOS (polycystic ovarian syndrome) 12/09/2012  . Encounter  for long-term (current) use of other medications 12/09/2012  . Tobacco use disorder 12/09/2012  . Type II or unspecified type diabetes mellitus without mention of complication, not stated as uncontrolled 12/09/2012  . Major depressive disorder, recurrent (HCC) 01/24/2011  . Generalized anxiety disorder 01/24/2011  . Borderline personality disorder (HCC) 01/24/2011    Past Surgical History:  Procedure Laterality Date  . LAPAROTOMY    . THROAT SURGERY      OB History   None      Home Medications    Prior to Admission medications   Medication Sig Start Date End Date Taking? Authorizing Provider  albuterol (PROVENTIL HFA;VENTOLIN HFA) 108 (90 Base) MCG/ACT inhaler Inhale 1-2 puffs into the lungs every 6 (six) hours as needed for wheezing or shortness of breath. 11/05/17   Dahlia Byes A, NP  cholecalciferol (VITAMIN D) 1000 units tablet Take 1,000 Units by mouth daily.    [provider]  FLUoxetine (PROZAC) 40 MG capsule Take 40 mg by mouth daily.    [provider]  meclizine (ANTIVERT) 12.5 MG tablet Take 1 tablet (12.5 mg total) by mouth 3 (three) times daily as needed for dizziness or nausea. 10/11/17   Wieters, Hallie C, PA-C  NAPROXEN PO Take by mouth.    [provider]  ondansetron (ZOFRAN ODT) 4 MG disintegrating tablet Take 1 tablet (4 mg total) by mouth every 8 (eight) hours as needed for nausea or vomiting. 10/11/17   Wieters, Hallie C, PA-C  predniSONE (STERAPRED  UNI-PAK 21 TAB) 10 MG (21) TBPK tablet 6 tabs for 1 day, then 5 tabs for 1 das, then 4 tabs for 1 day, then 3 tabs for 1 day, 2 tabs for 1 day, then 1 tab for 1 day 11/05/17   Dahlia Byes A, NP  risperiDONE (RISPERDAL) 0.25 MG tablet Take 0.25 mg by mouth at bedtime.    [provider]    Family History Family History  Problem Relation Age of Onset  . Heart disease Mother   . Diabetes Mother   . Hyperlipidemia Mother     Social History Social History   Tobacco Use  .  Smoking status: Current Every Day Smoker    Packs/day: 1.00  . Smokeless tobacco: Never Used  Substance Use Topics  . Alcohol use: Not Currently    Comment: last drink 09/29/16  . Drug use: No     Allergies   Levofloxacin; Sulfa antibiotics; and Wellbutrin [bupropion hcl]   Review of Systems Review of Systems   Physical Exam Triage Vital Signs ED Triage Vitals  Enc Vitals Group     BP 11/05/17 1032 113/82     Pulse Rate 11/05/17 1032 87     Resp --      Temp 11/05/17 1032 97.7 F (36.5 C)     Temp Source 11/05/17 1032 Oral     SpO2 11/05/17 1032 95 %     Weight 11/05/17 1034 250 lb (113.4 kg)     Height --      Head Circumference --      Peak Flow --      Pain Score 11/05/17 1034 4     Pain Loc --      Pain Edu? --      Excl. in GC? --    No data found.  Updated Vital Signs BP 113/82   Pulse 87   Temp 97.7 F (36.5 C) (Oral)   Wt 250 lb (113.4 kg)   SpO2 95%   BMI 40.35 kg/m   Visual Acuity Right Eye Distance:   Left Eye Distance:   Bilateral Distance:    Right Eye Near:   Left Eye Near:    Bilateral Near:     Physical Exam  Constitutional: She appears well-developed and well-nourished.  Appears ill   HENT:  Head: Normocephalic and atraumatic.  Bilateral TMs normal.  External ears normal.  Without posterior oropharyngeal erythema, tonsillar swelling or exudates. No lesions.  Clear drainage from nares.  No lymphadenopathy.     Eyes: Conjunctivae are normal.  Neck: Normal range of motion.  Cardiovascular: Normal rate and regular rhythm.  Pulmonary/Chest: Effort normal.  Course lung sounds in lower lung fields.  Mild dyspnea  Abdominal:  Abdomen soft, non tender. No CVA tenderness. No rebound tenderness.     Musculoskeletal: Normal range of motion.  Neurological: She is alert.  Skin: Skin is warm and dry.  Psychiatric: She has a normal mood and affect.  Nursing note and vitals reviewed.    UC Treatments / Results  Labs (all labs  ordered are listed, but only abnormal results are displayed) Labs Reviewed  POCT URINALYSIS DIP (DEVICE) - Abnormal; Notable for the following components:      Result Value   Glucose, UA 100 (*)    Bilirubin Urine MODERATE (*)    Ketones, ur TRACE (*)    Hgb urine dipstick MODERATE (*)    Protein, ur 100 (*)    All other components within normal limits  URINE CULTURE  URINE CYTOLOGY ANCILLARY ONLY    EKG None  Radiology Dg Chest 2 View  Result Date: 11/05/2017 CLINICAL DATA:  Productive cough EXAM: CHEST - 2 VIEW COMPARISON:  December 09, 2012 FINDINGS: Lungs are clear. Heart size and pulmonary vascularity are normal. No adenopathy. No pneumothorax. No bone lesions. IMPRESSION: No edema or consolidation. Electronically Signed   By: Bretta Bang III M.D.   On: 11/05/2017 11:56    Procedures Procedures (including critical care time)  Medications Ordered in UC Medications  ipratropium-albuterol (DUONEB) 0.5-2.5 (3) MG/3ML nebulizer solution 3 mL (3 mLs Nebulization Given 11/05/17 1110)    Initial Impression / Assessment and Plan / UC Course  I have reviewed the triage vital signs and the nursing notes.  Pertinent labs & imaging results that were available during my care of the patient were reviewed by me and considered in my medical decision making (see chart for details).     DuoNeb given in clinic Urine showed mild dehydration, some hemoglobin was present but negative for nitrites or leukocytes.  Will send for culture Instructed to increase fluids.  We will also send for cytology. Chest x-ray normal We will go ahead and treat for bronchitis With prednisone and inhaler Follow up as needed for continued or worsening symptoms   Final Clinical Impressions(s) / UC Diagnoses   Final diagnoses:  Acute bronchitis, unspecified organism  Dysuria     Discharge Instructions     It was nice meeting you!!  Your xray didn't show any pneumonia We will go ahead and treat  you for bronchitis Your urine was negative for infection but it did show some slight dehydration Make sure you are increasing your fluids We will send the urine for culture and cytology Follow up as needed for continued or worsening symptoms      ED Prescriptions    Medication Sig Dispense Auth. Provider   albuterol (PROVENTIL HFA;VENTOLIN HFA) 108 (90 Base) MCG/ACT inhaler Inhale 1-2 puffs into the lungs every 6 (six) hours as needed for wheezing or shortness of breath. 1 Inhaler Averey Koning A, NP   predniSONE (STERAPRED UNI-PAK 21 TAB) 10 MG (21) TBPK tablet 6 tabs for 1 day, then 5 tabs for 1 das, then 4 tabs for 1 day, then 3 tabs for 1 day, 2 tabs for 1 day, then 1 tab for 1 day 21 tablet Marykathryn Carboni A, NP     Controlled Substance Prescriptions [place] Controlled Substance Registry consulted? Not Applicable   Janace Aris, NP 11/05/17 1342    Isa Rankin, MD 11/16/17 [phone]

## 2017-11-05 NOTE — Discharge Instructions (Signed)
It was nice meeting you!!  Your xray didn't show any pneumonia We will go ahead and treat you for bronchitis Your urine was negative for infection but it did show some slight dehydration Make sure you are increasing your fluids We will send the urine for culture and cytology Follow up as needed for continued or worsening symptoms

## 2017-11-05 NOTE — ED Triage Notes (Signed)
Pt states she has vaginal pain this started last night. Cough and body ache x 4 days.

## 2017-11-06 LAB — URINE CULTURE: Culture: NO GROWTH

## 2017-11-08 LAB — URINE CYTOLOGY ANCILLARY ONLY
Chlamydia: NEGATIVE
NEISSERIA GONORRHEA: NEGATIVE
TRICH (WINDOWPATH): NEGATIVE

## 2017-11-09 LAB — URINE CYTOLOGY ANCILLARY ONLY
Bacterial vaginitis: NEGATIVE
Candida vaginitis: NEGATIVE

## 2017-12-23 ENCOUNTER — Other Ambulatory Visit: Payer: Self-pay | Admitting: Family Medicine

## 2017-12-23 DIAGNOSIS — Z1231 Encounter for screening mammogram for malignant neoplasm of breast: Secondary | ICD-10-CM

## 2018-01-20 ENCOUNTER — Ambulatory Visit: Payer: Self-pay | Admitting: Family Medicine

## 2018-01-25 ENCOUNTER — Encounter: Payer: Self-pay | Admitting: Family Medicine

## 2018-01-25 ENCOUNTER — Other Ambulatory Visit: Payer: Self-pay

## 2018-01-25 ENCOUNTER — Ambulatory Visit: Payer: BLUE CROSS/BLUE SHIELD | Admitting: Family Medicine

## 2018-01-25 VITALS — BP 106/72 | HR 92 | Temp 98.3°F | Ht 66.0 in | Wt 240.8 lb

## 2018-01-25 DIAGNOSIS — G4733 Obstructive sleep apnea (adult) (pediatric): Secondary | ICD-10-CM

## 2018-01-25 DIAGNOSIS — F3341 Major depressive disorder, recurrent, in partial remission: Secondary | ICD-10-CM

## 2018-01-25 DIAGNOSIS — E559 Vitamin D deficiency, unspecified: Secondary | ICD-10-CM

## 2018-01-25 DIAGNOSIS — G2581 Restless legs syndrome: Secondary | ICD-10-CM | POA: Insufficient documentation

## 2018-01-25 DIAGNOSIS — G4750 Parasomnia, unspecified: Secondary | ICD-10-CM | POA: Insufficient documentation

## 2018-01-25 DIAGNOSIS — E119 Type 2 diabetes mellitus without complications: Secondary | ICD-10-CM

## 2018-01-25 DIAGNOSIS — M25551 Pain in right hip: Secondary | ICD-10-CM

## 2018-01-25 MED ORDER — FLUOXETINE HCL 20 MG PO TABS: 20.0000 mg | ORAL_TABLET | Freq: Every day | ORAL | 0 refills | Status: DC

## 2018-01-25 MED ORDER — FLUOXETINE HCL 40 MG PO CAPS: 40.0000 mg | ORAL_CAPSULE | Freq: Every day | ORAL | 1 refills | Status: DC

## 2018-01-25 NOTE — Patient Instructions (Signed)
° ° ° °  If you have lab work done today you will be contacted with your lab results within the next 2 weeks.  If you have not heard from us then please contact us. The fastest way to get your results is to register for My Chart. ° ° °IF you received an x-ray today, you will receive an invoice from [facility]. Please contact [facility] at [phone] with questions or concerns regarding your invoice.  ° °IF you received labwork today, you will receive an invoice from [facility]. Please contact [facility] at [phone] with questions or concerns regarding your invoice.  ° °Our billing staff will not be able to assist you with questions regarding bills from these companies. ° °You will be contacted with the lab results as soon as they are available. The fastest way to get your results is to activate your My Chart account. Instructions are located on the last page of this paperwork. If you have not heard from us regarding the results in 2 weeks, please contact this office. °  ° ° ° °

## 2018-01-25 NOTE — Progress Notes (Signed)
12/17/201911:04 AM  Claris Gladden 02/06/1971, 47 y.o. female [id number]  Chief Complaint  Patient presents with  . Establish Care    not sure of dm status at this point nor vit d status. Says A1c increased due to medication she was taken for bipolar. Would like to be tested.    HPI:   Patient is a 47 y.o. female with past medical history significant for DM2, OSA on CPAP, PCOS, depression, who presents today to establish care  Last medical care about 2 years Ran out of prozac about 3 months ago d id really well on it Struggles with alcohol - now sober for about a year, going to [place] Once she was sober - diagnosis of depression and not bipolar  PH9 = 4, no SI  Used to be metformin for PCOS When she was placed on bipolar meds A1c got really high, stopped medication and a1c normalized a1c in 2018, 5.6 Does not follow a diet  Having issues with hips, most severe in the right Pain lateral/localized to side of right hip Has been trying to rest, taking naproxen Works on her feet all day long  Uses her cpap every night Has not seen a provider for several years  Has had vitamin d deficiency in the past Used to take supplements, not recently She does eat diary    Fall Risk  01/25/2018  Falls in the past year? 0     No flowsheet data found.  Allergies  Allergen Reactions  . Levofloxacin Other (See Comments)    Tendon damage  . Sulfa Antibiotics Nausea And Vomiting and Nausea Only  . Wellbutrin [Bupropion Hcl] Other (See Comments)    "Crawling out of my skin"    Prior to Admission medications   Medication Sig Start Date End Date Taking? Authorizing Provider  albuterol (PROVENTIL HFA;VENTOLIN HFA) 108 (90 Base) MCG/ACT inhaler Inhale 1-2 puffs into the lungs every 6 (six) hours as needed for wheezing or shortness of breath. 11/05/17  Yes Bast, Traci A, NP  meclizine (ANTIVERT) 12.5 MG tablet Take 1 tablet (12.5 mg total) by mouth 3 (three) times daily as needed for  dizziness or nausea. 10/11/17  Yes Wieters, Hallie C, PA-C  NAPROXEN PO Take by mouth.   Yes [provider]  ondansetron (ZOFRAN ODT) 4 MG disintegrating tablet Take 1 tablet (4 mg total) by mouth every 8 (eight) hours as needed for nausea or vomiting. 10/11/17  Yes Wieters, Hallie C, PA-C  naproxen (NAPROSYN) 500 MG tablet naproxen 500 mg tablet    [provider]    Past Medical History:  Diagnosis Date  . Depression   . Diabetes mellitus without complication ([place])   . Hyperlipidemia   . Polycystic ovarian disease   . Polycystic ovarian disease 09/08/2012   takes Aldactone for it  . Sleep apnea     Past Surgical History:  Procedure Laterality Date  . LAPAROTOMY    . THROAT SURGERY      Social History   Tobacco Use  . Smoking status: Current Every Day Smoker    Packs/day: 1.00  . Smokeless tobacco: Never Used  Substance Use Topics  . Alcohol use: Not Currently    Comment: last drink 09/29/16    Family History  Problem Relation Age of Onset  . Heart disease Mother   . Diabetes Mother   . Hyperlipidemia Mother     Review of Systems  Constitutional: Negative for chills and fever.  Respiratory: Negative for cough  and shortness of breath.   Cardiovascular: Negative for chest pain, palpitations and leg swelling.  Gastrointestinal: Negative for abdominal pain, nausea and vomiting.     OBJECTIVE:  Blood pressure 106/72, pulse 92, temperature 98.3 F (36.8 C), temperature source Oral, height '5\' 6"'  (1.676 m), weight 240 lb 12.8 oz (109.2 kg), SpO2 94 %. Body mass index is 38.87 kg/m.   Wt Readings from Last 3 Encounters:  01/25/18 240 lb 12.8 oz (109.2 kg)  11/05/17 250 lb (113.4 kg)  10/08/16 232 lb (105.2 kg)    Physical Exam Vitals signs and nursing note reviewed.  Constitutional:      Appearance: She is well-developed.  HENT:     Head: Normocephalic and atraumatic.     Mouth/Throat:     Pharynx: No oropharyngeal exudate.  Eyes:      General: No scleral icterus.    Conjunctiva/sclera: Conjunctivae normal.     Pupils: Pupils are equal, round, and reactive to light.  Neck:     Musculoskeletal: Neck supple.  Cardiovascular:     Rate and Rhythm: Normal rate and regular rhythm.     Heart sounds: Normal heart sounds. No murmur. No friction rub. No gallop.   Pulmonary:     Effort: Pulmonary effort is normal.     Breath sounds: Normal breath sounds. No wheezing or rales.  Skin:    General: Skin is warm and dry.  Neurological:     Mental Status: She is alert and oriented to person, place, and time.     ASSESSMENT and PLAN  1. Type 2 diabetes mellitus without complication, without long-term current use of insulin ([place]) Checking labs today, medications will be adjusted as needed.  - CMP14+EGFR - Hemoglobin A1c - Lipid panel  2. Recurrent major depressive disorder, in partial remission (HCC) Restarting prozac, discussed titration to 23m daily - TSH  3. Obstructive sleep apnea of adult On cpap - CBC  4. Right hip pain - Ambulatory referral to Sports Medicine  5. Vitamin D deficiency Checking labs today, medications will be adjusted as needed.  - VITAMIN D 25 Hydroxy (Vit-D Deficiency, Fractures)  Other orders - naproxen (NAPROSYN) 500 MG tablet; naproxen 500 mg tablet - FLUoxetine (PROZAC) 20 MG tablet; Take 1 tablet (20 mg total) by mouth daily. - FLUoxetine (PROZAC) 40 MG capsule; Take 1 capsule (40 mg total) by mouth daily.  Return in about 3 months (around 04/26/2018).    IRutherford Guys MD [facility] [id number].  [phone] [phone]

## 2018-01-26 LAB — LIPID PANEL
Chol/HDL Ratio: 5.6 ratio — ABNORMAL HIGH (ref 0.0–4.4)
Cholesterol, Total: 213 mg/dL — ABNORMAL HIGH (ref 100–199)
HDL: 38 mg/dL — ABNORMAL LOW (ref >39)
LDL Calculated: 150 mg/dL — ABNORMAL HIGH (ref 0–99)
Triglycerides: 124 mg/dL (ref 0–149)
VLDL Cholesterol Cal: 25 mg/dL (ref 5–40)

## 2018-01-26 LAB — CMP14+EGFR
ALT: 37 IU/L — ABNORMAL HIGH (ref 0–32)
AST: 25 IU/L (ref 0–40)
Albumin/Globulin Ratio: 2.3 — ABNORMAL HIGH (ref 1.2–2.2)
Albumin: 4.6 g/dL (ref 3.5–5.5)
Alkaline Phosphatase: 83 IU/L (ref 39–117)
BUN/Creatinine Ratio: 16 (ref 9–23)
BUN: 10 mg/dL (ref 6–24)
Bilirubin Total: 0.5 mg/dL (ref 0.0–1.2)
CO2: 23 mmol/L (ref 20–29)
Calcium: 9.6 mg/dL (ref 8.7–10.2)
Chloride: 101 mmol/L (ref 96–106)
Creatinine, Ser: 0.64 mg/dL (ref 0.57–1.00)
GFR calc Af Amer: 122 mL/min/{1.73_m2} (ref >59)
GFR calc non Af Amer: 106 mL/min/{1.73_m2} (ref >59)
Globulin, Total: 2 g/dL (ref 1.5–4.5)
Glucose: 93 mg/dL (ref 65–99)
Potassium: 4.4 mmol/L (ref 3.5–5.2)
Sodium: 138 mmol/L (ref 134–144)
Total Protein: 6.6 g/dL (ref 6.0–8.5)

## 2018-01-26 LAB — HEMOGLOBIN A1C
Est. average glucose Bld gHb Est-mCnc: 120 mg/dL
Hgb A1c MFr Bld: 5.8 % — ABNORMAL HIGH (ref 4.8–5.6)

## 2018-01-26 LAB — CBC
Hematocrit: 46.7 % — ABNORMAL HIGH (ref 34.0–46.6)
Hemoglobin: 15.4 g/dL (ref 11.1–15.9)
MCH: 32 pg (ref 26.6–33.0)
MCHC: 33 g/dL (ref 31.5–35.7)
MCV: 97 fL (ref 79–97)
Platelets: 329 10*3/uL (ref 150–450)
RBC: 4.81 x10E6/uL (ref 3.77–5.28)
RDW: 13.2 % (ref 12.3–15.4)
WBC: 12 10*3/uL — ABNORMAL HIGH (ref 3.4–10.8)

## 2018-01-26 LAB — VITAMIN D 25 HYDROXY (VIT D DEFICIENCY, FRACTURES): Vit D, 25-Hydroxy: 26.9 ng/mL — ABNORMAL LOW (ref 30.0–100.0)

## 2018-01-26 LAB — TSH: TSH: 2.04 u[IU]/mL (ref 0.450–4.500)

## 2018-01-28 ENCOUNTER — Encounter: Payer: Self-pay | Admitting: Family Medicine

## 2018-01-28 DIAGNOSIS — D72829 Elevated white blood cell count, unspecified: Secondary | ICD-10-CM

## 2018-01-30 MED ORDER — ATORVASTATIN CALCIUM 10 MG PO TABS: 10.0000 mg | ORAL_TABLET | Freq: Every day | ORAL | 3 refills | Status: DC

## 2018-01-30 MED ORDER — METFORMIN HCL ER 500 MG PO TB24: 500.0000 mg | ORAL_TABLET | Freq: Every day | ORAL | 1 refills | Status: DC

## 2018-01-31 ENCOUNTER — Other Ambulatory Visit: Payer: Self-pay | Admitting: Family Medicine

## 2018-01-31 ENCOUNTER — Ambulatory Visit (INDEPENDENT_AMBULATORY_CARE_PROVIDER_SITE_OTHER): Payer: BLUE CROSS/BLUE SHIELD | Admitting: Family Medicine

## 2018-01-31 DIAGNOSIS — D72829 Elevated white blood cell count, unspecified: Secondary | ICD-10-CM

## 2018-01-31 NOTE — Telephone Encounter (Signed)
Copied from CRM [id number]. Topic: Quick Communication - See Telephone Encounter >> Jan 31, 2018  1:56 PM Waymon AmatoBurton, Donna F wrote: Pt calling stating that the atorvastatin and metformin was sent to the wrong pharmacy it should [facility] been [facility] [place]  [place] ;which is where it was sent on 01/30/18 at 818 pm   Best number [phone]

## 2018-01-31 NOTE — Progress Notes (Signed)
Lab orders

## 2018-02-01 DIAGNOSIS — Z882 Allergy status to sulfonamides status: Secondary | ICD-10-CM | POA: Diagnosis not present

## 2018-02-01 DIAGNOSIS — Z79899 Other long term (current) drug therapy: Secondary | ICD-10-CM | POA: Diagnosis not present

## 2018-02-01 DIAGNOSIS — R112 Nausea with vomiting, unspecified: Secondary | ICD-10-CM | POA: Diagnosis not present

## 2018-02-01 DIAGNOSIS — Z975 Presence of (intrauterine) contraceptive device: Secondary | ICD-10-CM | POA: Diagnosis not present

## 2018-02-01 DIAGNOSIS — Z888 Allergy status to other drugs, medicaments and biological substances status: Secondary | ICD-10-CM | POA: Diagnosis not present

## 2018-02-01 DIAGNOSIS — F17200 Nicotine dependence, unspecified, uncomplicated: Secondary | ICD-10-CM | POA: Diagnosis not present

## 2018-02-01 DIAGNOSIS — R109 Unspecified abdominal pain: Secondary | ICD-10-CM | POA: Diagnosis not present

## 2018-02-01 DIAGNOSIS — F99 Mental disorder, not otherwise specified: Secondary | ICD-10-CM | POA: Diagnosis not present

## 2018-02-01 DIAGNOSIS — D259 Leiomyoma of uterus, unspecified: Secondary | ICD-10-CM | POA: Diagnosis not present

## 2018-02-01 DIAGNOSIS — R1031 Right lower quadrant pain: Secondary | ICD-10-CM | POA: Diagnosis not present

## 2018-02-01 DIAGNOSIS — E119 Type 2 diabetes mellitus without complications: Secondary | ICD-10-CM | POA: Diagnosis not present

## 2018-02-01 LAB — CBC WITH DIFFERENTIAL/PLATELET
Basophils Absolute: 0.1 10*3/uL (ref 0.0–0.2)
Basos: 1 %
EOS (ABSOLUTE): 0.2 10*3/uL (ref 0.0–0.4)
Eos: 2 %
Hematocrit: 44.6 % (ref 34.0–46.6)
Hemoglobin: 15.1 g/dL (ref 11.1–15.9)
Immature Grans (Abs): 0 10*3/uL (ref 0.0–0.1)
Immature Granulocytes: 0 %
Lymphocytes Absolute: 2.7 10*3/uL (ref 0.7–3.1)
Lymphs: 24 %
MCH: 32.6 pg (ref 26.6–33.0)
MCHC: 33.9 g/dL (ref 31.5–35.7)
MCV: 96 fL (ref 79–97)
Monocytes Absolute: 0.7 10*3/uL (ref 0.1–0.9)
Monocytes: 6 %
Neutrophils Absolute: 7.4 10*3/uL — ABNORMAL HIGH (ref 1.4–7.0)
Neutrophils: 67 %
Platelets: 345 10*3/uL (ref 150–450)
RBC: 4.63 x10E6/uL (ref 3.77–5.28)
RDW: 13.6 % (ref 12.3–15.4)
WBC: 11 10*3/uL — ABNORMAL HIGH (ref 3.4–10.8)

## 2018-02-01 MED ORDER — ATORVASTATIN CALCIUM 10 MG PO TABS: 10.0000 mg | ORAL_TABLET | Freq: Every day | ORAL | 3 refills | Status: DC

## 2018-02-01 MED ORDER — METFORMIN HCL ER 500 MG PO TB24: 500.0000 mg | ORAL_TABLET | Freq: Every day | ORAL | 1 refills | Status: DC

## 2018-02-01 NOTE — Telephone Encounter (Signed)
Called pt and was able to schedule appt with Dr. Leretha PolSantiago (per MyChart message) for 02/15/18 at 4:20. I advised pt of time, building number and late policy. Pt acknowledged.

## 2018-02-08 ENCOUNTER — Ambulatory Visit
Admission: RE | Admit: 2018-02-08 | Discharge: 2018-02-08 | Disposition: A | Discharge status: Home / Self Care | Payer: BLUE CROSS/BLUE SHIELD | Source: Ambulatory Visit | Attending: Family Medicine | Admitting: Family Medicine

## 2018-02-08 DIAGNOSIS — Z1231 Encounter for screening mammogram for malignant neoplasm of breast: Secondary | ICD-10-CM | POA: Diagnosis not present

## 2018-02-08 NOTE — Addendum Note (Signed)
Addended by: Myles LippsSANTIAGO, Kaidence Sant M on: 02/08/2018 04:30 PM   Modules accepted: Orders

## 2018-02-10 NOTE — Progress Notes (Signed)
Yes please. Thank you

## 2018-02-12 ENCOUNTER — Telehealth: Payer: Self-pay | Admitting: Family Medicine

## 2018-02-12 NOTE — Telephone Encounter (Signed)
Dr. Leretha Pol wants the patient to come back in for LAB VISIT only. She wants to do a peripheral smear. I called and left a message for the patient to call the office Shay  02-12-18

## 2018-02-15 ENCOUNTER — Ambulatory Visit: Payer: BLUE CROSS/BLUE SHIELD | Admitting: Family Medicine

## 2018-02-15 ENCOUNTER — Encounter: Payer: Self-pay | Admitting: Family Medicine

## 2018-02-15 VITALS — BP 128/87 | HR 94 | Temp 98.6°F | Resp 17 | Ht 66.0 in | Wt 241.0 lb

## 2018-02-15 DIAGNOSIS — Z5321 Procedure and treatment not carried out due to patient leaving prior to being seen by health care provider: Secondary | ICD-10-CM

## 2018-02-15 NOTE — Patient Instructions (Signed)
° ° ° °  If you have lab work done today you will be contacted with your lab results within the next 2 weeks.  If you have not heard from us then please contact us. The fastest way to get your results is to register for My Chart. ° ° °IF you received an x-ray today, you will receive an invoice from [facility]. Please contact [facility] at [phone] with questions or concerns regarding your invoice.  ° °IF you received labwork today, you will receive an invoice from [facility]. Please contact [facility] at [phone] with questions or concerns regarding your invoice.  ° °Our billing staff will not be able to assist you with questions regarding bills from these companies. ° °You will be contacted with the lab results as soon as they are available. The fastest way to get your results is to activate your My Chart account. Instructions are located on the last page of this paperwork. If you have not heard from us regarding the results in 2 weeks, please contact this office. °  ° ° ° °

## 2018-07-03 ENCOUNTER — Telehealth: Payer: BLUE CROSS/BLUE SHIELD | Admitting: Family

## 2018-07-03 ENCOUNTER — Encounter: Payer: Self-pay | Admitting: Family Medicine

## 2018-07-03 DIAGNOSIS — R300 Dysuria: Secondary | ICD-10-CM

## 2018-07-03 NOTE — Progress Notes (Signed)
Based on what you shared with me, I feel your condition warrants further evaluation and I recommend that you be seen for a face to face office visit.     NOTE: If you entered your credit card information for this eVisit, you will not be charged. You may see a "hold" on your card for the $35 but that hold will drop off and you will not have a charge processed.  If you are having a true medical emergency please call 911.  If you need an urgent face to face visit, [facility] has four urgent care centers for your convenience.    PLEASE NOTE: THE [facility] LOCATIONS AND URGENT CARE CLINICS DO NOT HAVE THE TESTING FOR CORONAVIRUS COVID19 AVAILABLE.  IF YOU FEEL YOU NEED THIS TEST YOU MUST GO TO A TRIAGE LOCATION AT ONE OF THE HOSPITAL EMERGENCY DEPARTMENTS   [email or web address] to reserve your spot online an avoid wait times  [facility] [address], [address] [place], [place] [address] Modified hours of operation: Monday-Friday, 12 PM to 6 PM  Saturday & Sunday 10 AM to 4 PM *Across the street from [employer]  [facility] (New Address!) [address], [address] , [place] [address] *Just off [address], across the road from [employer]* Modified hours of operation: Monday-Friday, 12 PM to 6 PM  Closed Saturday & Sunday  [facility]'s modified hours of operation will be in effect from May 1 until May 31   The following sites will take your insurance:  . [facility]  [phone] Get Driving Directions Find a Provider at this Location  [address] [place], [place] [address] . 10 am to 8 pm Monday-Friday . 12 pm to 8 pm Saturday-Sunday   . [facility]  [phone] Get Driving Directions Find a Provider at this Location  [address], [address] [place], [place] [address] . 8 am to 8 pm Monday-Friday . 9 am to 6 pm Saturday . 11 am to 6 pm Sunday   . [facility] [place]  [phone] Get Driving Directions  [address] [place], [place] [address] . 8 am to 8 pm Monday-Friday . 8 am to 4 pm Saturday-Sunday   Your e-visit answers were reviewed by a board certified advanced clinical practitioner to complete your personal care plan.  Thank you for using e-Visits. 

## 2020-02-01 ENCOUNTER — Ambulatory Visit (HOSPITAL_COMMUNITY)
Admission: EM | Admit: 2020-02-01 | Discharge: 2020-02-02 | Disposition: A | Discharge status: Home / Self Care | Payer: No Payment, Other | Attending: Student | Admitting: Student

## 2020-02-01 ENCOUNTER — Encounter (HOSPITAL_COMMUNITY): Payer: Self-pay | Admitting: Emergency Medicine

## 2020-02-01 ENCOUNTER — Other Ambulatory Visit: Payer: Self-pay

## 2020-02-01 DIAGNOSIS — F17210 Nicotine dependence, cigarettes, uncomplicated: Secondary | ICD-10-CM | POA: Insufficient documentation

## 2020-02-01 DIAGNOSIS — F333 Major depressive disorder, recurrent, severe with psychotic symptoms: Secondary | ICD-10-CM | POA: Insufficient documentation

## 2020-02-01 DIAGNOSIS — R454 Irritability and anger: Secondary | ICD-10-CM | POA: Insufficient documentation

## 2020-02-01 DIAGNOSIS — F39 Unspecified mood [affective] disorder: Secondary | ICD-10-CM | POA: Diagnosis not present

## 2020-02-01 DIAGNOSIS — G4700 Insomnia, unspecified: Secondary | ICD-10-CM | POA: Insufficient documentation

## 2020-02-01 DIAGNOSIS — Z20822 Contact with and (suspected) exposure to covid-19: Secondary | ICD-10-CM | POA: Diagnosis not present

## 2020-02-01 DIAGNOSIS — F22 Delusional disorders: Secondary | ICD-10-CM | POA: Insufficient documentation

## 2020-02-01 DIAGNOSIS — R450 Nervousness: Secondary | ICD-10-CM | POA: Insufficient documentation

## 2020-02-01 DIAGNOSIS — Z79899 Other long term (current) drug therapy: Secondary | ICD-10-CM | POA: Insufficient documentation

## 2020-02-01 DIAGNOSIS — R451 Restlessness and agitation: Secondary | ICD-10-CM | POA: Diagnosis not present

## 2020-02-01 DIAGNOSIS — F419 Anxiety disorder, unspecified: Secondary | ICD-10-CM | POA: Insufficient documentation

## 2020-02-01 LAB — POCT URINE DRUG SCREEN - MANUAL ENTRY (I-SCREEN)
POC Amphetamine UR: NOT DETECTED
POC Buprenorphine (BUP): NOT DETECTED
POC Cocaine UR: NOT DETECTED
POC Marijuana UR: NOT DETECTED
POC Methadone UR: NOT DETECTED
POC Methamphetamine UR: NOT DETECTED
POC Morphine: NOT DETECTED
POC Oxazepam (BZO): NOT DETECTED
POC Oxycodone UR: NOT DETECTED
POC Secobarbital (BAR): NOT DETECTED

## 2020-02-01 LAB — POC SARS CORONAVIRUS 2 AG -  ED: SARS Coronavirus 2 Ag: NEGATIVE

## 2020-02-01 LAB — POCT PREGNANCY, URINE: Preg Test, Ur: NEGATIVE

## 2020-02-01 MED ORDER — NICOTINE 14 MG/24HR TD PT24: 14.0000 mg | MEDICATED_PATCH | Freq: Every day | TRANSDERMAL | Status: DC

## 2020-02-01 MED ORDER — ACETAMINOPHEN 325 MG PO TABS: 650.0000 mg | ORAL_TABLET | Freq: Four times a day (QID) | ORAL | Status: DC | PRN

## 2020-02-01 MED ORDER — ALUM & MAG HYDROXIDE-SIMETH 200-200-20 MG/5ML PO SUSP: 30.0000 mL | ORAL | Status: DC | PRN

## 2020-02-01 MED ORDER — LORAZEPAM 1 MG PO TABS: 1.0000 mg | ORAL_TABLET | Freq: Once | ORAL | Status: DC

## 2020-02-01 MED ORDER — MAGNESIUM HYDROXIDE 400 MG/5ML PO SUSP: 30.0000 mL | Freq: Every day | ORAL | Status: DC | PRN

## 2020-02-01 MED ORDER — TRAZODONE HCL 50 MG PO TABS: 50.0000 mg | ORAL_TABLET | Freq: Every evening | ORAL | Status: DC | PRN

## 2020-02-01 NOTE — ED Notes (Signed)
Member of [facility], reports pt with paranoia, agitation, disorientation also.  Recovery group wants to know if it is safe for pt to return to facility.

## 2020-02-01 NOTE — ED Provider Notes (Signed)
Behavioral Health Admission [facility])  Date: 02/01/20 Patient Name: Alicia Porter MRN: [id number] Chief Complaint:  Chief Complaint  Patient presents with  . Depression      Diagnoses:  Final diagnoses:  Unspecified mood (affective) disorder (HCC)  Paranoia (HCC)  Agitation    HPI: Alicia Porter is a 49 y.o. female who presents to [facility] voluntarily due to agitation and paranoia. Patient reports that she traveled from [place] on 01/31/20 to [facility] in [place]. Representative from [facility] present during assessment per patient's request. Patient is alert and oriented x 4. She is irritable at times and uncooperative. Speech is clear and coherent. During the evaluation, patient abruptly states "stop with the therapeutic nonsense and the mmm mmm mmm and remain professional." She states that she travels between [place] and [place] regularly. States that she was living with her parents in [place]. Patient reports that people at [facility] are irritating her. She reports feeling that people are talking about her and conspiring against her. When asking questions about paranoia, patient states "You will not understand, just move on." She denies auditory and visual hallucinations. She denies suicidal ideations. She denies homicidal ideations. She states that she does not feel safe returning to the [employer]. She is agreeable to staying at [facility] for continuous assessment. Representative from [facility] states that the patient can return to [facility] when discharged.   Patient states that she was last psychiatrically hospitalized  In 2018 at [facility]. She states that she has not taken medications in 3 years and that she is not interested in starting medications. She reports that she has a history of alcohol abuse but has been sober for 3 years. She denies use of illicit substances.     PHQ 2-9:     Total Time spent with patient: 30 minutes  Musculoskeletal   Strength & Muscle Tone: within normal limits Gait & Station: normal Patient leans: N/A  Psychiatric Specialty Exam  Presentation General Appearance: Appropriate for Environment; Neat  Eye Contact:Good  Speech:Clear and Coherent; Normal Rate  Speech Volume:Normal  Handedness:Right   Mood and Affect  Mood:Anxious; Depressed; Irritable  Affect:Congruent   Thought Process  Thought Processes:Coherent  Descriptions of Associations:Intact  Orientation:Full (Time, Place and Person)  Thought Content:Paranoid Ideation  Hallucinations:Hallucinations: Other (comment) (Denies)  Ideas of Reference:Paranoia  Suicidal Thoughts:Suicidal Thoughts: No  Homicidal Thoughts:Homicidal Thoughts: No   Sensorium  Memory:Immediate Good; Recent Good; Remote Good  Judgment:Intact  Insight:Lacking   Executive Functions  Concentration:Fair  Attention Span:Fair  Recall:Good  Fund of Knowledge:Good  Language:Good   Psychomotor Activity  Psychomotor Activity:Psychomotor Activity: Normal   Assets  Assets:Desire for Improvement; Housing; Physical Health   Sleep  Sleep:Sleep: Fair   Physical Exam Constitutional:      General: She is not in acute distress.    Appearance: She is not ill-appearing, toxic-appearing or diaphoretic.  HENT:     Head: Normocephalic.     Right Ear: External ear normal.     Left Ear: External ear normal.  Eyes:     Conjunctiva/sclera: Conjunctivae normal.     Pupils: Pupils are equal, round, and reactive to light.  Cardiovascular:     Rate and Rhythm: Normal rate.  Pulmonary:     Effort: Pulmonary effort is normal. No respiratory distress.  Musculoskeletal:        General: Normal range of motion.  Neurological:     General: No focal deficit present.     Mental  Status: She is alert and oriented to person, place, and time.  Psychiatric:        Mood and Affect: Mood is anxious and depressed.        Thought Content: Thought content is  paranoid. Thought content is not delusional. Thought content does not include homicidal or suicidal ideation. Thought content does not include suicidal plan.    Review of Systems  Constitutional: Negative for chills, diaphoresis, fever, malaise/fatigue and weight loss.  HENT: Negative for congestion.   Respiratory: Negative for cough and shortness of breath.   Cardiovascular: Negative for chest pain and palpitations.  Gastrointestinal: Negative for diarrhea, nausea and vomiting.  Neurological: Negative for dizziness and seizures.  Psychiatric/Behavioral: Positive for depression. Negative for hallucinations, memory loss, substance abuse and suicidal ideas. The patient is nervous/anxious and has insomnia.   All other systems reviewed and are negative.   Pulse 86, temperature 97.7 F (36.5 C), temperature source Temporal, resp. rate 20, height 5\' 7"  (1.702 m), weight 170 lb (77.1 kg), SpO2 96 %. Body mass index is 26.63 kg/m.  Past Psychiatric History: MDD with psychotic features, alcohol use disorder, Bipolar Disorder  Is the patient at risk to self? No  Has the patient been a risk to self in the past 6 months? No .    Has the patient been a risk to self within the distant past? No   Is the patient a risk to others? No   Has the patient been a risk to others in the past 6 months? No   Has the patient been a risk to others within the distant past? No   Past Medical History:  Past Medical History:  Diagnosis Date  . Depression   . Diabetes mellitus without complication (HCC)   . Hyperlipidemia   . Polycystic ovarian disease   . Polycystic ovarian disease 09/08/2012   takes Aldactone for it  . Sleep apnea     Past Surgical History:  Procedure Laterality Date  . LAPAROTOMY    . THROAT SURGERY      Family History:  Family History  Problem Relation Age of Onset  . Heart disease Mother   . Diabetes Mother   . Hyperlipidemia Mother     Social History:  Social History    Socioeconomic History  . Marital status: Single    Spouse name: Not on file  . Number of children: Not on file  . Years of education: Not on file  . Highest education level: Not on file  Occupational History  . Not on file  Tobacco Use  . Smoking status: Current Every Day Smoker    Packs/day: 1.00  . Smokeless tobacco: Never Used  Vaping Use  . Vaping Use: Never used  Substance and Sexual Activity  . Alcohol use: Not Currently    Comment: last drink 09/29/16  . Drug use: No  . Sexual activity: Yes  Other Topics Concern  . Not on file  Social History Narrative  . Not on file   Social Determinants of Health   Financial Resource Strain: Not on file  Food Insecurity: Not on file  Transportation Needs: Not on file  Physical Activity: Not on file  Stress: Not on file  Social Connections: Not on file  Intimate Partner Violence: Not on file    SDOH:  SDOH Screenings   Alcohol Screen: Not on file  Depression 10/01/16): Not on file  Financial Resource Strain: Not on file  Food Insecurity: Not on file  Housing: Not on file  Physical Activity: Not on file  Social Connections: Not on file  Stress: Not on file  Tobacco Use: High Risk  . Smoking Tobacco Use: Current Every Day Smoker  . Smokeless Tobacco Use: Never Used  Transportation Needs: Not on file    Last Labs:  No visits with results within 6 Month(s) from this visit.  Latest known visit with results is:  Office Visit on 01/31/2018  Component Date Value Ref Range Status  . WBC 01/31/2018 11.0* 3.4 - 10.8 x10E3/uL Final  . RBC 01/31/2018 4.63  3.77 - 5.28 x10E6/uL Final  . Hemoglobin 01/31/2018 15.1  11.1 - 15.9 g/dL Final  . Hematocrit 16/11/9602 44.6  34.0 - 46.6 % Final  . MCV 01/31/2018 96  79 - 97 fL Final  . MCH 01/31/2018 32.6  26.6 - 33.0 pg Final  . MCHC 01/31/2018 33.9  31.5 - 35.7 g/dL Final  . RDW 54/10/8117 13.6  12.3 - 15.4 % Final   Comment: **Effective February 14, 2018, the RDW pediatric  reference**   interval will be removed and the adult reference interval   will be changing to:                             Female 11.7 - 15.4                                                      Female 11.6 - 15.4   . Platelets 01/31/2018 345  150 - 450 x10E3/uL Final  . Neutrophils 01/31/2018 67  Not Estab. % Final  . Lymphs 01/31/2018 24  Not Estab. % Final  . Monocytes 01/31/2018 6  Not Estab. % Final  . Eos 01/31/2018 2  Not Estab. % Final  . Basos 01/31/2018 1  Not Estab. % Final  . Neutrophils Absolute 01/31/2018 7.4* 1.4 - 7.0 x10E3/uL Final  . Lymphocytes Absolute 01/31/2018 2.7  0.7 - 3.1 x10E3/uL Final  . Monocytes Absolute 01/31/2018 0.7  0.1 - 0.9 x10E3/uL Final  . EOS (ABSOLUTE) 01/31/2018 0.2  0.0 - 0.4 x10E3/uL Final  . Basophils Absolute 01/31/2018 0.1  0.0 - 0.2 x10E3/uL Final  . Immature Granulocytes 01/31/2018 0  Not Estab. % Final  . Immature Grans (Abs) 01/31/2018 0.0  0.0 - 0.1 x10E3/uL Final    Allergies: Levofloxacin, Sulfa antibiotics, and Wellbutrin [bupropion hcl]  PTA Medications: (Not in a hospital admission)   Medical Decision Making  Admission labs ordered   Patient declines medications      Recommendations  Based on my evaluation the patient does not appear to have an emergency medical condition.   Patient will be placed in the continuous assessment area at [facility] for treatment and stabilization. ... will be reevaluated on 02/02/2020. The treatment team will determine disposition at that time.     Jackelyn Poling, NP 02/01/20  9:14 PM

## 2020-02-01 NOTE — ED Triage Notes (Signed)
Presents with depression increasing in severity tonight.  Denies SI, HI or AVH.

## 2020-02-01 NOTE — ED Notes (Signed)
Pt very irritable and agitated, refusing staff to put linen on her bed.  Demanding that staff not come near her.  NP Nira Conn at bedside to speak with pt.  Pt very argumentative and demeaning to staff.  Resting at present.  Monitoring for safety.  No distress noted.
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
## 2020-02-01 NOTE — ED Notes (Signed)
Pt is sleeping@this time. Breathing even and unlabored. Will continue to monitor for safety 

## 2020-02-01 NOTE — ED Notes (Signed)
Unable to obtain blood work, due to pt having c/o of pain and jumping while nurse was trying to obtain the bloodwork

## 2020-02-02 LAB — RESP PANEL BY RT-PCR (FLU A&B, COVID) ARPGX2
Influenza A by PCR: NEGATIVE
Influenza B by PCR: NEGATIVE
SARS Coronavirus 2 by RT PCR: NEGATIVE

## 2020-02-02 NOTE — Discharge Instructions (Signed)
  Discharge recommendations:  Please see information for follow-up appointment with psychiatry and therapy. Please follow up with your primary care provider for all medical related needs.   Therapy: We recommend that patient participate in individual therapy to address mental health concerns.  Medications: The patient is to contact a medical professional and/or outpatient provider to address any new side effects that develop. Patient should update outpatient providers of any new medications and/or medication changes.   Safety:  The patient should abstain from use of illicit substances/drugs and abuse of any medications. If symptoms worsen or do not continue to improve or if the patient becomes actively suicidal or homicidal then it is recommended that the patient return to the closest hospital emergency department, the [facility], or call 911 for further evaluation and treatment. National Suicide Prevention Lifeline 1-800-SUICIDE or 801-536-7193.

## 2020-02-02 NOTE — ED Provider Notes (Addendum)
FBC/OBS ASAP Discharge Summary  Date and Time: 02/02/2020 2:10 PM  Name: Alicia Porter  MRN:  [id number]   Discharge Diagnoses:  Final diagnoses:  Unspecified mood (affective) disorder (HCC)  Paranoia (HCC)  Agitation    Subjective: Patient states she slept fairly well. She has not had breakfast yet. She states that she is ready to be discharged and does not understand the point of being at the [facility]. She denies SI, HI, AVH, or delusions. She reports that her current situation with her mother and father who live in [place] is "not good" and that they "get her very sick". She states that her father "uses old [place] tradition", but does not elaborate further. She states that the "people at [facility] are trying to push her buttons". Patient states that she is homeless and can't find stable housing. Patient is adamant that she does not need to be restarted on medications.   On exam, patient is sitting in a chair in hospital scrubs, appearing irritated, but in no acute distress. She is A&Ox4. Her mood is irritable with congruent affect. Patient is cooperative initially, but then becomes uncooperative and is unwilling to answer any further questions during the remainder of the encounter. She does not appear to be responding to internal stimuli.   Stay Summary: Patient presented to the [facility] as a voluntary walk-in from [facility] accompanied by an [employer] on 02/01/2020 for agitation and paranoia. Patient agreed to stay at the [facility] for continuous overnight observation/assessment.  Following labs were ordered upon overnight observation:  -Urine Pregnancy: Negative   -Lipid Panel, Hemoglobin A1c, TSH, CMP, CBC w/ Diff ordered but not collected due to "patient having complaint of pain and jumping while nurse was trying to obtain the bloodwork" (per 02/01/2020 9:43 PM nursing note) One-time dose of Ativan 1 mg PO administered to patient on 02/01/2020 at 2115. Patient was not  started on any scheduled psychotropic medications upon continuous observation.  Patient noted by nursing staff to be "very irritable and agitated, refusing staff to put linen on her bed. Demanding staff not come near her." as well as "very argumentative and demeaning to staff." (per 02/01/2020 10:27 pm nursing note). Patient reassessed by myself this morning. Patient is cooperative initially, but then becomes uncooperative and refuses to answer any further questions for the remainder of the encounter.   Patient discussed with Dr. Lucianne Muss.   Total Time spent with patient: 15 minutes  Past Psychiatric History:  Per Chart Review:  -MDD, recurrent  -Generalized Anxiety Disorder -Borderline Personality Disorder  -Tobacco use disorder -Parasomnia  -Restless Leg Syndrome  -MDD, recurrent episode, severe, with psychosis  -Alcohol use disorder  -Bipolar 1 Disorder  -Drug abuse  -Delusional Disorder   Past Medical History:  Past Medical History:  Diagnosis Date  . Depression   . Diabetes mellitus without complication (HCC)   . Hyperlipidemia   . Polycystic ovarian disease   . Polycystic ovarian disease 09/08/2012   takes Aldactone for it  . Sleep apnea     Past Surgical History:  Procedure Laterality Date  . LAPAROTOMY    . THROAT SURGERY     Family History:  Family History  Problem Relation Age of Onset  . Heart disease Mother   . Diabetes Mother   . Hyperlipidemia Mother    Family Psychiatric History: None reported by patient.   Social History:  Social History   Substance and Sexual Activity  Alcohol Use Not Currently   Comment: last  drink 09/29/16     Social History   Substance and Sexual Activity  Drug Use No    Social History   Socioeconomic History  . Marital status: Single    Spouse name: Not on file  . Number of children: Not on file  . Years of education: Not on file  . Highest education level: Not on file  Occupational History  . Not on file  Tobacco  Use  . Smoking status: Current Every Day Smoker    Packs/day: 1.00  . Smokeless tobacco: Never Used  Vaping Use  . Vaping Use: Never used  Substance and Sexual Activity  . Alcohol use: Not Currently    Comment: last drink 09/29/16  . Drug use: No  . Sexual activity: Yes  Other Topics Concern  . Not on file  Social History Narrative  . Not on file   Social Determinants of Health   Financial Resource Strain: Not on file  Food Insecurity: Not on file  Transportation Needs: Not on file  Physical Activity: Not on file  Stress: Not on file  Social Connections: Not on file   SDOH:  SDOH Screenings   Alcohol Screen: Not on file  Depression (IWP8-0): Not on file  Financial Resource Strain: Not on file  Food Insecurity: Not on file  Housing: Not on file  Physical Activity: Not on file  Social Connections: Not on file  Stress: Not on file  Tobacco Use: High Risk  . Smoking Tobacco Use: Current Every Day Smoker  . Smokeless Tobacco Use: Never Used  Transportation Needs: Not on file    Has this patient used any form of tobacco in the last 30 days? (Cigarettes, Smokeless Tobacco, Cigars, and/or Pipes) A prescription for an FDA-approved tobacco cessation medication was offered at discharge and the patient refused  Current Medications:  Current Facility-Administered Medications  Medication Dose Route Frequency Provider Last Rate Last Admin  . acetaminophen (TYLENOL) tablet 650 mg  650 mg Oral Q6H PRN Jackelyn Poling, NP      . alum & mag hydroxide-simeth (MAALOX/MYLANTA) 200-200-20 MG/5ML suspension 30 mL  30 mL Oral Q4H PRN Nira Conn A, NP      . LORazepam (ATIVAN) tablet 1 mg  1 mg Oral Once Nira Conn A, NP      . magnesium hydroxide (MILK OF MAGNESIA) suspension 30 mL  30 mL Oral Daily PRN Nira Conn A, NP      . nicotine (NICODERM CQ - dosed in mg/24 hours) patch 14 mg  14 mg Transdermal Daily Nira Conn A, NP      . traZODone (DESYREL) tablet 50 mg  50 mg Oral QHS PRN  Jackelyn Poling, NP       Current Outpatient Medications  Medication Sig Dispense Refill  . atorvastatin (LIPITOR) 10 MG tablet Take 10 mg by mouth daily. (Patient not taking: No sig reported)    . FLUoxetine (PROZAC) 20 MG tablet Take 1 tablet (20 mg total) by mouth daily. (Patient not taking: Reported on 02/02/2020) 30 tablet 0  . metFORMIN (GLUCOPHAGE-XR) 500 MG 24 hr tablet Take 500 mg by mouth daily. (Patient not taking: No sig reported)    . Multiple Vitamins-Minerals (THERAPEUTIC-M) tablet Take 1 tablet by mouth daily. (Patient not taking: No sig reported)      PTA Medications: (Not in a hospital admission)   Musculoskeletal  Strength & Muscle Tone: within normal limits Gait & Station: normal Patient leans: N/A  Psychiatric Specialty Exam  Presentation  General Appearance: Fairly Groomed  Eye Contact:Good  Speech:Clear and Coherent; Normal Rate  Speech Volume:Normal  Handedness:Right   Mood and Affect  Mood:Irritable  Affect:Congruent   Thought Process  Thought Processes:Coherent  Descriptions of Associations:Intact  Orientation:Full (Time, Place and Person)  Thought Content:Scattered  Hallucinations:Hallucinations: None  Ideas of Reference:None  Suicidal Thoughts:Suicidal Thoughts: No  Homicidal Thoughts:Homicidal Thoughts: No   Sensorium  Memory:Immediate Good; Recent Good; Remote Good  Judgment:Fair  Insight:Lacking   Executive Functions  Concentration:Fair  Attention Span:Fair  Recall:Fair  Fund of Knowledge:Fair  Language:Good   Psychomotor Activity  Psychomotor Activity:Psychomotor Activity: Normal   Assets  Assets:Physical Health; Leisure Time; Communication Skills; Housing   Sleep  Sleep:Sleep: Fair   Physical Exam  Physical Exam Vitals reviewed.  Constitutional:      General: She is not in acute distress.    Appearance: She is normal weight. She is not ill-appearing, toxic-appearing or diaphoretic.      Comments: Patient appears irritated.   HENT:     Head: Normocephalic and atraumatic.     Right Ear: External ear normal.     Left Ear: External ear normal.  Cardiovascular:     Rate and Rhythm: Normal rate.  Pulmonary:     Effort: Pulmonary effort is normal. No respiratory distress.  Musculoskeletal:        General: Normal range of motion.     Cervical back: Normal range of motion.  Neurological:     General: No focal deficit present.     Mental Status: She is alert and oriented to person, place, and time.  Psychiatric:        Attention and Perception: Attention normal. She does not perceive auditory or visual hallucinations.        Speech: Speech normal.        Behavior: Behavior is not slowed, aggressive, withdrawn, hyperactive or combative.        Thought Content: Thought content is not delusional. Thought content does not include homicidal or suicidal ideation.        Cognition and Memory: Cognition normal.     Comments: Mood is irritable with congruent affect. Patient is cooperative initially, but then becomes uncooperative and is unwilling to answer any further questions during the remainder of the encounter. Judgement fair.    Review of Systems  Constitutional: Negative for chills, diaphoresis, fever, malaise/fatigue and weight loss.  HENT: Negative for congestion.   Respiratory: Negative for cough and shortness of breath.   Cardiovascular: Negative for chest pain and palpitations.  Gastrointestinal: Negative for abdominal pain, constipation, diarrhea, nausea and vomiting.  Musculoskeletal: Negative for joint pain and myalgias.  Neurological: Negative for dizziness and headaches.  Psychiatric/Behavioral: Positive for depression. Negative for hallucinations, memory loss and suicidal ideas. The patient has insomnia. The patient is not nervous/anxious.      Vitals: Blood pressure 91/74, pulse 86, temperature 97.8 F (36.6 C), temperature source Temporal, resp. rate 18, height  5\' 7"  (1.702 m), weight 170 lb (77.1 kg), SpO2 99 %. Body mass index is 26.63 kg/m.  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  Continued Clinical Symptoms:  More than one psychiatric diagnosis  Cognitive Features That Contribute To Risk:  Closed-mindedness    Suicide Risk:  Minimal: No identifiable suicidal ideation.  Patients presenting with no risk factors  but with morbid ruminations; may be classified as minimal risk based on the severity of the depressive symptoms  Plan Of Care/Follow-up recommendations:  Activity:  As Tolerated Diet:  No Dietary Restrictions Other:  [facility] Open [facility] Hours: Monday - Thursday (8:00 AM - 11:00 AM), Friday (1:00 pm - 4:00 pm) for medication management and therapy services.  No psychotropic medication prescriptions provided upon discharge. Patient encouraged to follow up Outpatient [facility] 2[facility] Monday-Friday Outpatient Open Access/Walk-In Hours for psychiatry/medication management and therapy services. Information regarding these hours provided to patient.  Safety plan Information regarding actions to take/resources to utilize if patient becomes actively suicidal or homicidal or if symptoms worsen or do not improve provided to patient.   Disposition:  Patient denies SI, HI, AVH, or delusions and does not appear to be psychotic at this time. She displays irritable, uncooperative behavior and agitation, which appears to be behavioral rather than related to psychosis. She does not appear to be a threat/harm to herself or others. Patient does not meet criteria for inpatient psychiatric treatment at this time and may follow up with outpatient services.  Patient to be [facility] to [facility] with resources  for [facility].   Jaclyn Shaggyody W Arif Amendola, PA-C 02/02/2020, 2:10 PM

## 2020-02-02 NOTE — ED Notes (Signed)
Educated pt on avs. Pt verbalized understanding. MHT escorted pt to retrieve belongings. Ambulated per self. A&O x4. No SI, HI, or AVH. However increasing agitation noted in regards to pt not getting out as quick as they thought they should be. No s/s pain, discomfort, or acute distress. Stable at time of d/c

## 2020-02-02 NOTE — ED Notes (Signed)
Pt sleeping@this time. Breathing even and unlabored. Will continue to monitor for safety 

## 2020-02-02 NOTE — ED Notes (Signed)
Pt resting with eyse closed. Rise and fall of chest noted. No new issues noted at this time. Will continue to monitor for safety

## 2020-02-02 NOTE — ED Notes (Signed)
Pt given breakfast; oatmeal, juice

## 2020-10-24 IMAGING — MG DIGITAL SCREENING BILATERAL MAMMOGRAM WITH TOMO AND CAD
8 series · 8 of 24 positions shown · non-contrast
Comparison: Previous exam(s).

CLINICAL DATA: Screening.

EXAM:
DIGITAL SCREENING BILATERAL MAMMOGRAM WITH TOMO AND CAD

[L MLO synth-2D]
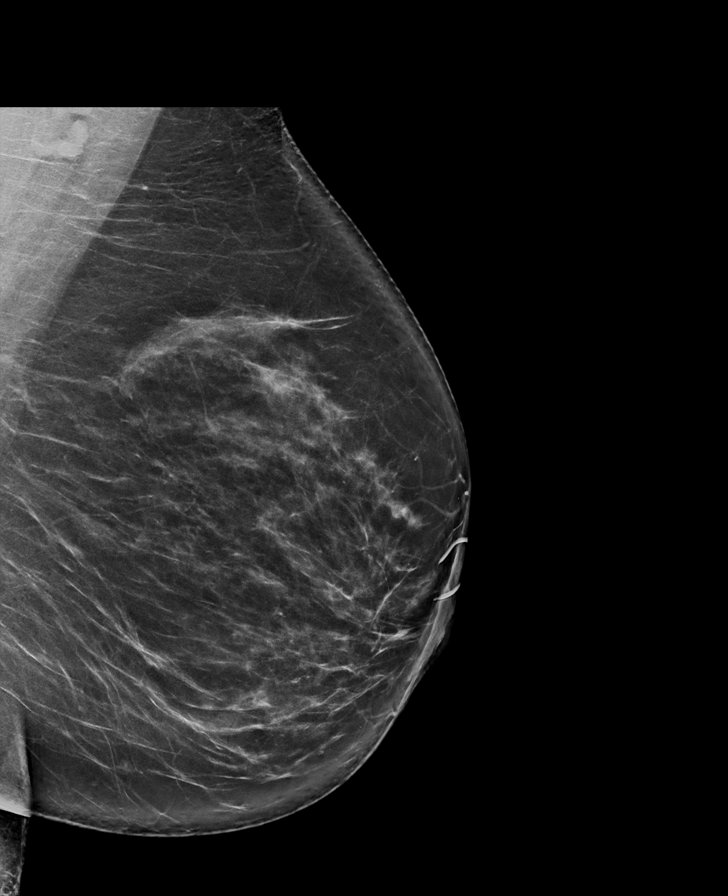

[R CC synth-2D]
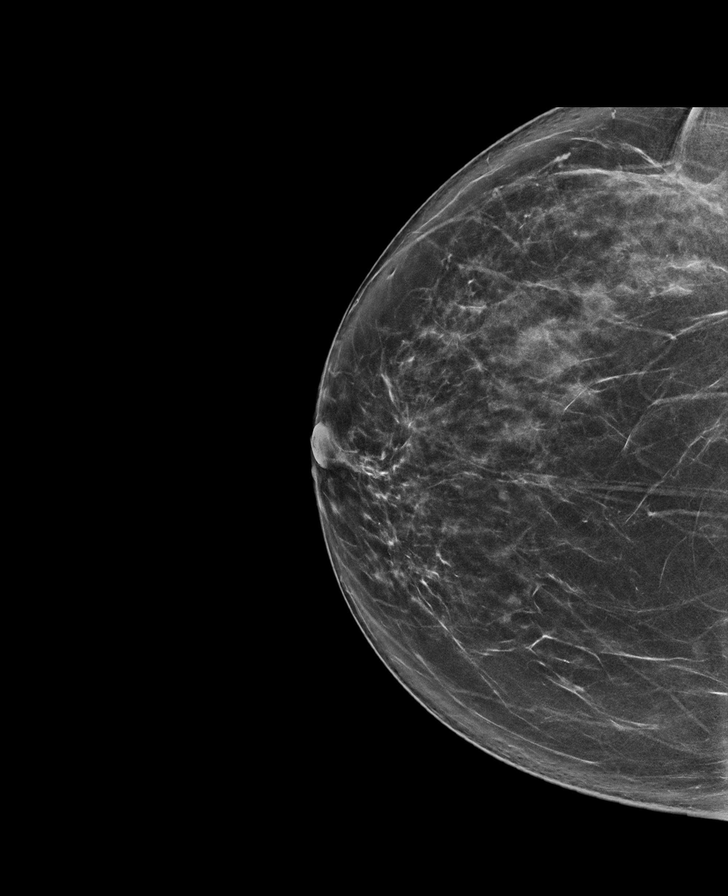

[R MLO synth-2D]
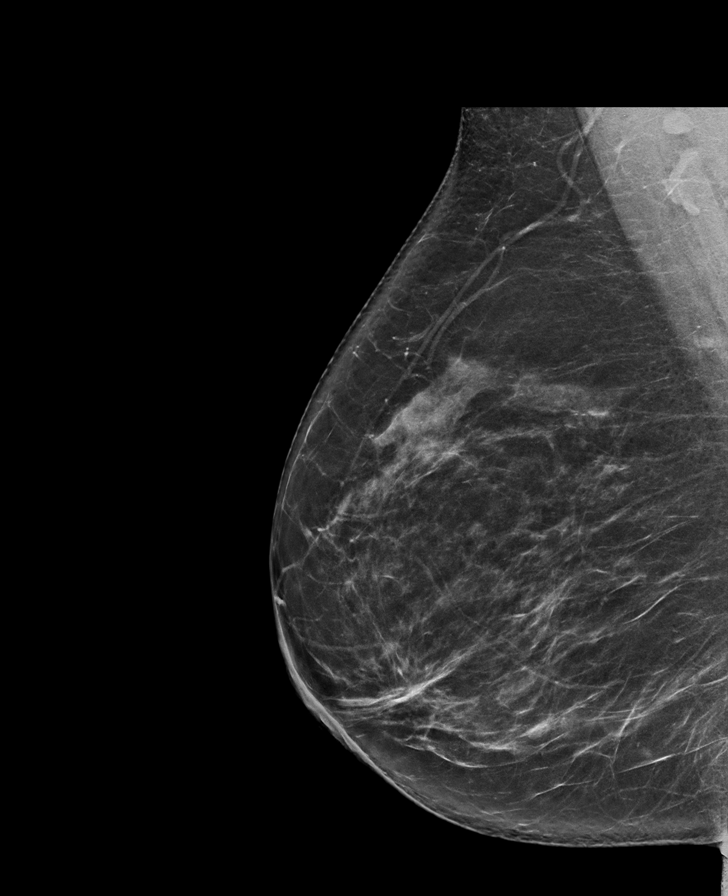

[L CC synth-2D]
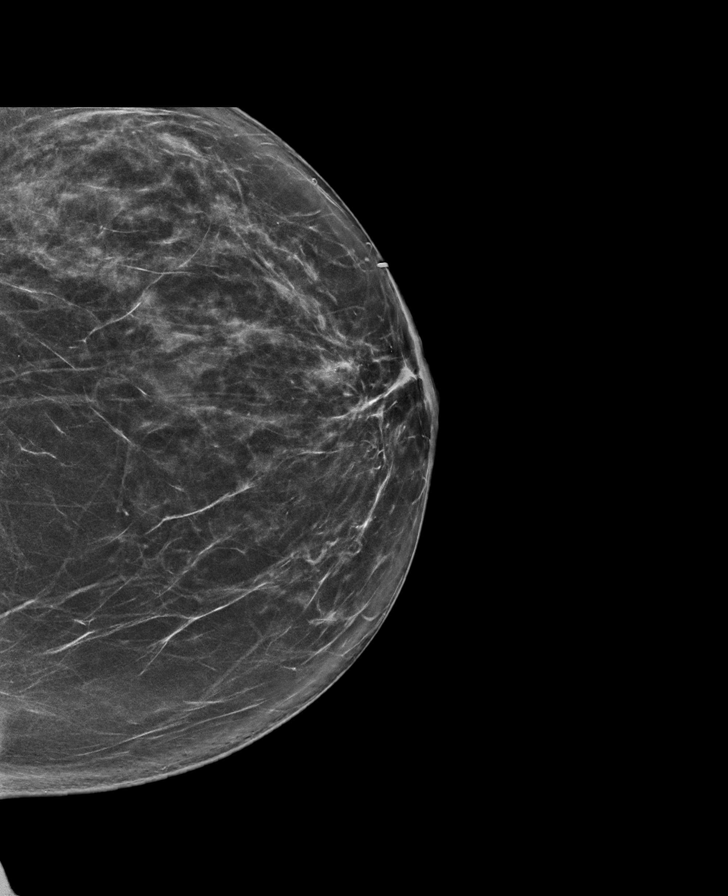

[R CC tomo · tomo slice 42/83.0]
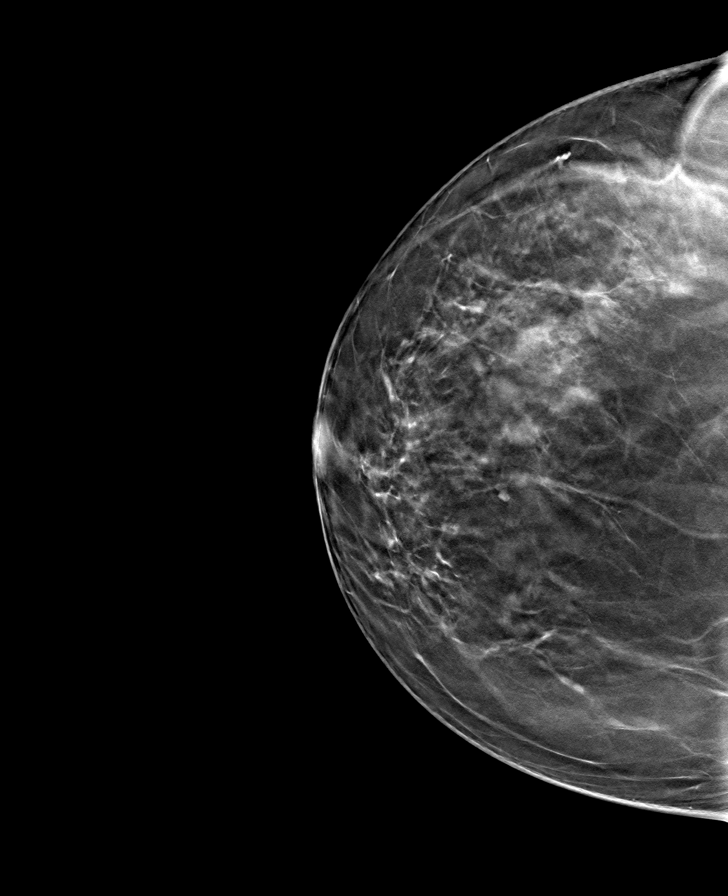

[L CC tomo · tomo slice 41/80.0]
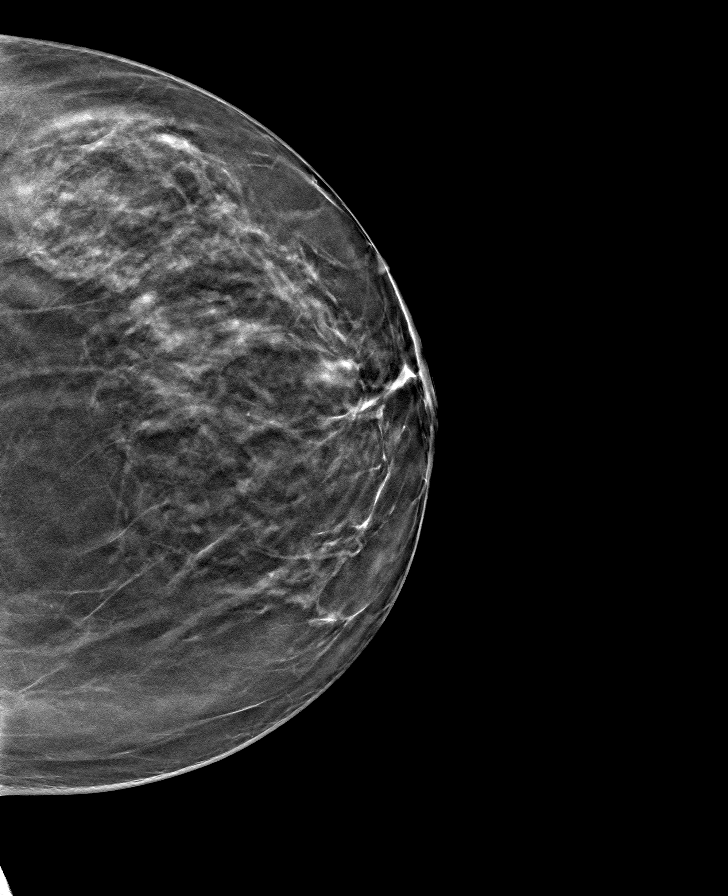

[L MLO tomo · tomo slice 46/91.0]
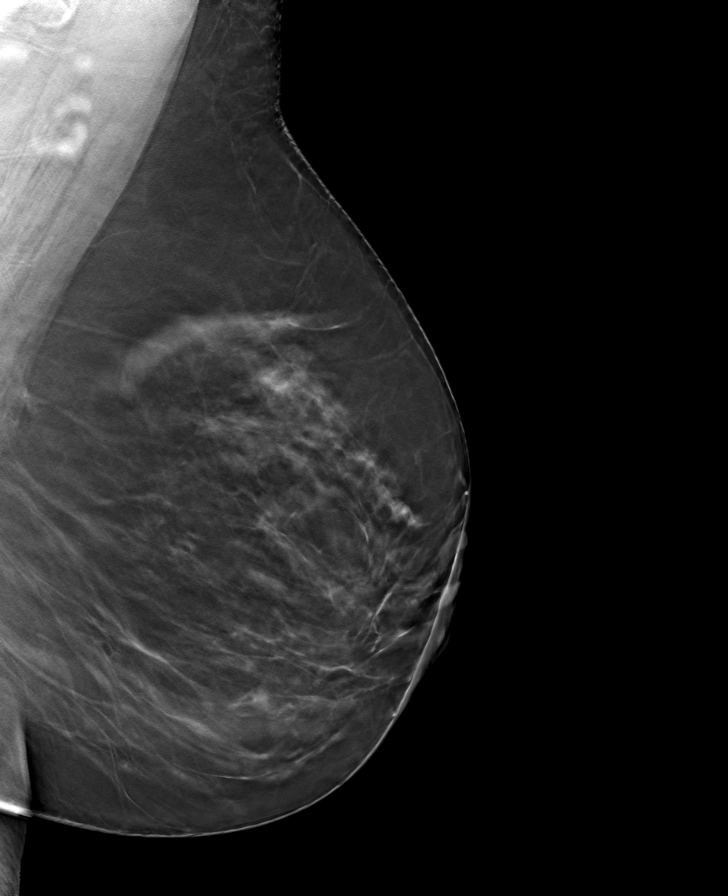

[R MLO tomo · tomo slice 44/87.0]
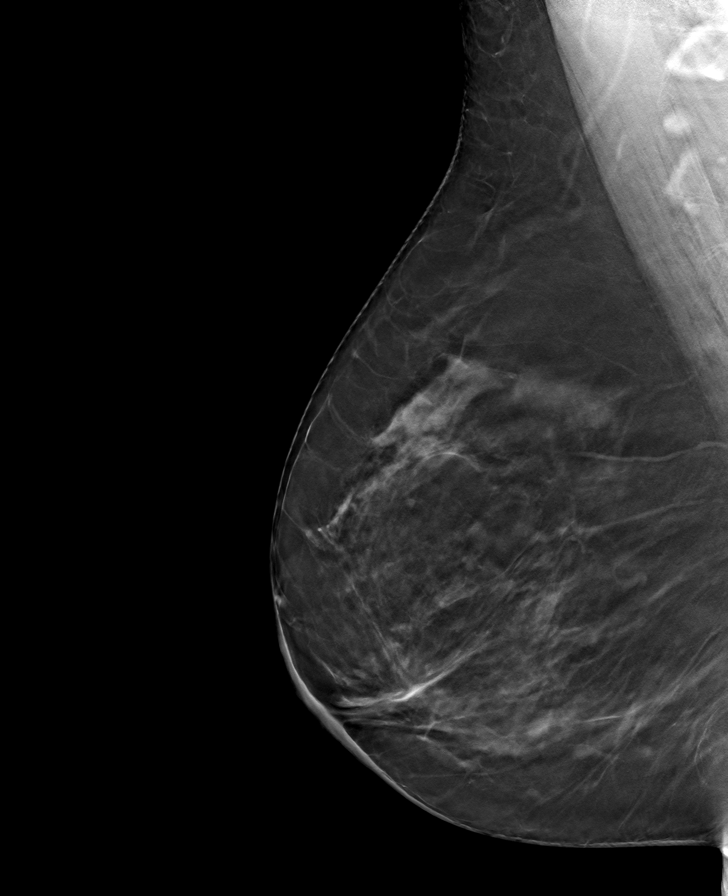

[8 of 24 positions shown; findings below may reference images not displayed]

ACR Breast Density Category b: There are scattered areas of
fibroglandular density.
FINDINGS: There are no findings suspicious for malignancy. Images were
processed with CAD.
IMPRESSION: No mammographic evidence of malignancy. A result letter of this
screening mammogram will be mailed directly to the patient.

RECOMMENDATION:
Screening mammogram in one year. (Code:[id number])

BI-RADS CATEGORY  1: Negative.

## 2023-07-26 ENCOUNTER — Encounter (HOSPITAL_COMMUNITY): Payer: Self-pay | Admitting: Licensed Clinical Social Worker

## 2023-07-26 ENCOUNTER — Telehealth (HOSPITAL_COMMUNITY): Payer: Self-pay | Admitting: Licensed Clinical Social Worker

## 2023-07-26 NOTE — Telephone Encounter (Signed)
 This therapist returns Alicia Porter's telephone call leaving a HIPAA compliant voicemail.  Additionally, he sends an email to [email or web address] with the following:  Good afternoon,  I left a voicemail on the contact number that you provided. I am sending this email so that you have my email as well as my direct contact number. I look forward to speaking with you.  Sincerely,    Melynda Stagger, MA, LCSW, [facility], LCAS 07/26/2023

## 2023-07-27 ENCOUNTER — Telehealth (HOSPITAL_COMMUNITY): Payer: Self-pay | Admitting: Licensed Clinical Social Worker

## 2023-07-27 NOTE — Telephone Encounter (Signed)
 The therapist received another voicemail from [place] and returns her call leaving another HIPAA compliant voicemail.    Melynda Stagger, MA, LCSW, [facility], LCAS 07/27/2023

## 2023-07-27 NOTE — Telephone Encounter (Signed)
 The therapist received a return call from [place].  She asks about the composition of the [facility] IOP program apparently wanting to assure that the program is not comprised of court ordered referrals.  She is scheduled to come in for an assessment on Monday, June 23rd at 1 PM.  Melynda Stagger, [place], [place], [facility], LCAS 07/27/2023

## 2023-08-02 ENCOUNTER — Ambulatory Visit (INDEPENDENT_AMBULATORY_CARE_PROVIDER_SITE_OTHER): Payer: MEDICAID | Admitting: Licensed Clinical Social Worker

## 2023-08-02 DIAGNOSIS — F1321 Sedative, hypnotic or anxiolytic dependence, in remission: Secondary | ICD-10-CM

## 2023-08-02 DIAGNOSIS — F3132 Bipolar disorder, current episode depressed, moderate: Secondary | ICD-10-CM

## 2023-08-02 DIAGNOSIS — F1020 Alcohol dependence, uncomplicated: Secondary | ICD-10-CM | POA: Diagnosis not present

## 2023-08-02 DIAGNOSIS — F4310 Post-traumatic stress disorder, unspecified: Secondary | ICD-10-CM
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
## 2023-08-03 ENCOUNTER — Telehealth (HOSPITAL_COMMUNITY): Payer: Self-pay | Admitting: Licensed Clinical Social Worker

## 2023-08-03 NOTE — Telephone Encounter (Signed)
 The therapist returns Alicia Porter's call confirming her identity via 2 identifiers.  She says that she has arranged transportation so that she can begin attending SA IOP starting this Friday.  The therapist inquires about her yes answer to having an eating disorder on the nutritional questionnaire.  She says that she answered yes to this question due to her issue with overeating at times.  She does verify having some issues with bulimia but says that this was many years ago.  She adds that she just resumed her 1200-calorie a day diet.  Zell Maier, MA, LCSW, [facility], LCAS 08/03/2023

## 2023-08-06 ENCOUNTER — Ambulatory Visit (INDEPENDENT_AMBULATORY_CARE_PROVIDER_SITE_OTHER): Payer: MEDICAID | Admitting: Licensed Clinical Social Worker

## 2023-08-06 ENCOUNTER — Telehealth (HOSPITAL_COMMUNITY): Payer: Self-pay

## 2023-08-06 ENCOUNTER — Other Ambulatory Visit (HOSPITAL_COMMUNITY): Payer: Self-pay | Admitting: Medical

## 2023-08-06 DIAGNOSIS — F1020 Alcohol dependence, uncomplicated: Secondary | ICD-10-CM | POA: Diagnosis not present

## 2023-08-06 DIAGNOSIS — F4310 Post-traumatic stress disorder, unspecified: Secondary | ICD-10-CM

## 2023-08-06 DIAGNOSIS — F3132 Bipolar disorder, current episode depressed, moderate: Secondary | ICD-10-CM

## 2023-08-06 DIAGNOSIS — F1321 Sedative, hypnotic or anxiolytic dependence, in remission: Secondary | ICD-10-CM

## 2023-08-06 MED ORDER — DULOXETINE HCL 30 MG PO CPEP: 90.0000 mg | ORAL_CAPSULE | Freq: Every day | ORAL | 2 refills | Status: DC

## 2023-08-06 MED ORDER — LAMOTRIGINE 21 X 25 MG & 7 X 50 MG PO KIT: PACK | ORAL | 0 refills | Status: DC

## 2023-08-06 MED ORDER — BACLOFEN 10 MG PO TABS: 10.0000 mg | ORAL_TABLET | Freq: Three times a day (TID) | ORAL | 2 refills | Status: DC

## 2023-08-06 NOTE — Progress Notes (Signed)
 Daily Group Progress Note   Program: [facility]     Group Time: 9 a.m. to 12 p.m.      Type of Therapy: Process and Psychoeducational    Topic: The therapists check in with group members, assess for SI/HI/psychosis and overall level of functioning. The therapists inquire about sobriety date and number of community support meetings attended since last session.   The therapists discuss dealing with emotions explaining that emotions are neither good nor bad or right or wrong but like indicators on a car dashboard helping to guide people through life effectively. The therapists explain the importance of having social supports pointing out that whether or not a person is in recovery that how much social capital a person has is the key factor in almost all people's level of life satisfaction. The therapists continue to explain that using marijuana or switching addictions leads back to using the person's primary substance given that any use of addictive or controlled substances prevents a person's brain from building back depleted dopamine receptors. The therapists continue explain that addiction is a disease versus a character defect and address the issue of whether or not a person wants to come out as an alcoholic or person in recovery to other people in their lives. The therapists discourage quitting smoking or vaping cold [place] explaining how doing so can possibly lead to negatively reinforcing the behavior the person wants to stop. The therapists make group members aware of [place] [employer].      Summary: Alicia Porter presents for her initial group rating both her depression and anxiety as a 5.  She participates in the discussion concerning social capital saying that she had about 6 years of sobriety but that she lost her sober supports.  She says that she recently moved back into the area and is now socially isolated so has come to [facility] to work on finding some sober supports.  Alicia Porter says that it has been  particularly challenging as she is not driving now.  She shares with the group that she also has issues with depression and bipolar disorder.  Alicia Porter's affect is initially anxious but she displays a broad range of affect by the end of group and smiles at times and indicates that she felt more comfortable as she found some commonality in the things that other group member shared during the initial check-in portion of group.  During today's discussion regarding cannabis, Alicia Porter says that she unsuccessfully in the past tried using cannabis to stay sober from alcohol.  She indicates that she recognizes that this strategy does not work.   Progress Towards Goals: Alicia Porter reports no change in her sobriety date   UDS collected: No Results: No  AA/NA attended?:  No   Sponsor?:  No     Elsie Maier, MA, [place], [facility], LCAS Darice Simpler, [place], LCAS 08/06/2023

## 2023-08-06 NOTE — Telephone Encounter (Signed)
 Hello,   Spoke to Patients pharmacy who called today stating that they cannot fill patients: lamoTRIgine  21 x 25 MG & 7 x 50 MG KIT.  States that script is not clear and that they are not sure which kit to provide to patient, Pharmacist states to either resend new script with clear directions or to call pharmacy at: [phone]     JNL

## 2023-08-09 ENCOUNTER — Ambulatory Visit (INDEPENDENT_AMBULATORY_CARE_PROVIDER_SITE_OTHER): Payer: MEDICAID | Admitting: Medical

## 2023-08-09 ENCOUNTER — Encounter (HOSPITAL_COMMUNITY): Payer: Self-pay | Admitting: Medical

## 2023-08-09 ENCOUNTER — Other Ambulatory Visit (HOSPITAL_COMMUNITY): Payer: Self-pay | Admitting: Medical

## 2023-08-09 VITALS — BP 118/87 | HR 90 | Wt 244.0 lb

## 2023-08-09 DIAGNOSIS — F1321 Sedative, hypnotic or anxiolytic dependence, in remission: Secondary | ICD-10-CM

## 2023-08-09 DIAGNOSIS — G8929 Other chronic pain: Secondary | ICD-10-CM

## 2023-08-09 DIAGNOSIS — Z6838 Body mass index (BMI) 38.0-38.9, adult: Secondary | ICD-10-CM

## 2023-08-09 DIAGNOSIS — Z8742 Personal history of other diseases of the female genital tract: Secondary | ICD-10-CM

## 2023-08-09 DIAGNOSIS — F5025 Bulimia nervosa, in remission: Secondary | ICD-10-CM

## 2023-08-09 DIAGNOSIS — M7071 Other bursitis of hip, right hip: Secondary | ICD-10-CM

## 2023-08-09 DIAGNOSIS — F1020 Alcohol dependence, uncomplicated: Secondary | ICD-10-CM

## 2023-08-09 DIAGNOSIS — M7061 Trochanteric bursitis, right hip: Secondary | ICD-10-CM

## 2023-08-09 DIAGNOSIS — F4310 Post-traumatic stress disorder, unspecified: Secondary | ICD-10-CM | POA: Diagnosis not present

## 2023-08-09 DIAGNOSIS — F603 Borderline personality disorder: Secondary | ICD-10-CM

## 2023-08-09 DIAGNOSIS — M5416 Radiculopathy, lumbar region: Secondary | ICD-10-CM

## 2023-08-09 DIAGNOSIS — E7800 Pure hypercholesterolemia, unspecified: Secondary | ICD-10-CM

## 2023-08-09 DIAGNOSIS — E66812 Obesity, class 2: Secondary | ICD-10-CM

## 2023-08-09 DIAGNOSIS — F22 Delusional disorders: Secondary | ICD-10-CM

## 2023-08-09 DIAGNOSIS — Z9989 Dependence on other enabling machines and devices: Secondary | ICD-10-CM

## 2023-08-09 DIAGNOSIS — M7062 Trochanteric bursitis, left hip: Secondary | ICD-10-CM

## 2023-08-09 DIAGNOSIS — G4733 Obstructive sleep apnea (adult) (pediatric): Secondary | ICD-10-CM

## 2023-08-09 DIAGNOSIS — R7303 Prediabetes: Secondary | ICD-10-CM

## 2023-08-09 DIAGNOSIS — F3132 Bipolar disorder, current episode depressed, moderate: Secondary | ICD-10-CM

## 2023-08-09 DIAGNOSIS — F418 Other specified anxiety disorders: Secondary | ICD-10-CM

## 2023-08-09 DIAGNOSIS — F4321 Adjustment disorder with depressed mood: Secondary | ICD-10-CM

## 2023-08-09 DIAGNOSIS — M7072 Other bursitis of hip, left hip: Secondary | ICD-10-CM

## 2023-08-09 DIAGNOSIS — Z8739 Personal history of other diseases of the musculoskeletal system and connective tissue: Secondary | ICD-10-CM

## 2023-08-09 MED ORDER — LAMOTRIGINE 100 MG PO TABS: 100.0000 mg | ORAL_TABLET | Freq: Every day | ORAL | 2 refills | Status: DC

## 2023-08-09 MED ORDER — LAMOTRIGINE 21 X 25 MG & 7 X 50 MG PO KIT: PACK | ORAL | 0 refills | Status: DC

## 2023-08-09 NOTE — Progress Notes (Signed)
 Daily Group Progress Note   Program: [facility]     Group Time: 9 a.m. to 12 p.m.      Type of Therapy: Process and Psychoeducational    Topic: The therapists check in with group members, assess for SI/HI/psychosis and overall level of functioning. The therapists inquire about sobriety date and number of community support meetings attended since last session.     Therapists present The Stages of Recovery by Marcey HERO. [place], specifically focusing on the abstinence stage. Therapist elaborate and focus on the abstinence stage, explaining that it starts immediately after a person stops using and usually lasts for 1-2 years. Therapist explained the main focus of this stage is dealing with cravings and not using. Therapist explain the tasks associated with the abstinence stage while prompting conversation regarding how group members are working on the tasks which include the following: Accept the one has an addiction, practice honesty in life, develop coping skills for dealing with cravings, become active in self-help groups, practice self-care and saying no, understand the stages of relapse, change people you are associating with if they are in active addiction.   Summary: Minka rates her depression as a 4 and her anxiety as a 5. She reports the same sobriety date (08-02-23). Dalayah says she has not attended meetings nor has a sponsor.  A peer show her how to get the everything AA on her phone and she says she will call some meetings and see if someone will pick her up as she does not have transportation to meetings.  A peer offered to take her after IOP, however Zuma voices she has had difficulty with some of the personalities in that group, so she wants to try other meetings.  Daizee says she will start with online meetings.   When discussing self-care, Jaquala says she starts out her day with one of three prayers that she prays and then moves on. Quantina discusses her fear of losing access to  healthcare if the current administration should cut or delete Medicaid.  Emelia asks about her depression and possible explanations. Therapist discuss Post Acute Withdrawal Stage as a possible explanation elaborating on the usual length of PAWS. Ivery discusses how she likes to be in nature as a form of self-care. Yerania says her take a way from the group was the information on PAWS (post acute withdrawal stage). Tytionna identifies her emotions as isolative and anxious.   Rivka asks for an individual therapy appointment with Darice who offers August 16, 2023 at noon.  [place] accepts.    Progress Towards Goals: Oliva reports no change in her sobriety date   UDS collected: Yes  Results: None  AA/NA attended?:  No   Sponsor?:  No   Darice Simpler, MS, LMFT, LCAS  [address], [place], [place], [facility], LCAS 08/09/2023

## 2023-08-09 NOTE — Telephone Encounter (Signed)
 Rx resent.

## 2023-08-09 NOTE — Progress Notes (Signed)
 Pharmacist claims can't understand EPIC rx ????? Resent and called Informed that [facility] does not do/have starter kits. Spoke with pharmacist gave rx  withdose and pill count Will add 100 mg rx for start when starter dosage completed

## 2023-08-09 NOTE — Progress Notes (Signed)
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  Appropriate and Congruent  Thought Process:  Coherent, Goal Directed, and Descriptions of Associations: Intact  Orientation:  Full (Time, Place, and Person)  Thought Content:  WDL, Logical, and Hallucinations: Paranoid delusions without loss of reality/aware they are not real  Suicidal Thoughts:  No  Homicidal Thoughts:  No  Memory:  Trauma informed  Judgement:  Impaired  Insight:  Lacking  Psychomotor Activity:  Negative  Concentration:  Concentration: Good and Attention Span: Good  Recall:  See memory  Fund of Knowledge:WDL  Language: Good  Akathisia:  NA  Handed:  Right  AIMS (if indicated):  NA  Assets:  Desire for Improvement Financial Resources/Insurance Housing Resilience Vocational/Educational  ADL's:  Impaired  Cognition: Impaired Mild Moderate  Sleep:  Poor   Screenings: AIMS    Flowsheet Row  [facility]  AIMS Total Score 0   AUDIT    Flowsheet Row Ad[facility]  Alcohol Use Disorder Identification Test Final Score (AUDIT) 34   GAD-7    Flowsheet Row Counselor from 08/02/2023 in [facility]  Total GAD-7 Score 17   PHQ2-9    Flowsheet Row Counselor from 08/02/2023 in [facility] Office Visit from 02/15/2018 in [facility] Care at [facility] Total Score 0 0  PHQ-9 Total Score 12 --   Flowsheet Row Counselor from 08/02/2023 in [facility]  C-SSRS RISK CATEGORY Moderate Risk    Assessment: Complex complicated CPTSD with multiple associated Psychiatric and Biological problems including SUDs sever dependence (Alcohol most recent)  and Plan: Treatment Plan/Recommendations:  Plan of Care: SUDs/Core issues [facility] CDIOP see Counselor's individualized treatment plan  Laboratory:  UDS per protocol  Psychotherapy: CD IOP Group,Individual and Family  Medications: See list MAT Baclofen Switch Trileptal to Lamictal  Increase Cymbalta to 90 mg daily  Routine PRN Medications:  None from IOP  Consultations:  Has consultants  Safety Concerns: RISK ASSESSMENT -Negative  Other:  Anatomy and Biology of addiction reviewed with Google (Pictures of Pet Scans Of Addicted Brains) and You Tube (Baclofen reduces cravings)    Collaboration of Care: Medication Management AEB   and Primary Care Provider AEB    Patient/Guardian was advised Release of Information must be obtained prior to any record release in order to collaborate their care with an outside provider. Patient/Guardian was advised if they have not already done so to contact the registration department to sign all necessary forms in order for us  to release information regarding their care.   Consent:  Patient/Guardian gives verbal consent for treatment and assignment of benefits for services provided during this visit. Patient/Guardian expressed understanding and agreed to proceed.   Carlin Emmer, PA-C 6/30/20255:38 PM

## 2023-08-11 ENCOUNTER — Ambulatory Visit (INDEPENDENT_AMBULATORY_CARE_PROVIDER_SITE_OTHER): Payer: MEDICAID

## 2023-08-11 DIAGNOSIS — F1020 Alcohol dependence, uncomplicated: Secondary | ICD-10-CM | POA: Diagnosis not present

## 2023-08-11 DIAGNOSIS — F4310 Post-traumatic stress disorder, unspecified: Secondary | ICD-10-CM

## 2023-08-11 DIAGNOSIS — F3132 Bipolar disorder, current episode depressed, moderate: Secondary | ICD-10-CM

## 2023-08-11 DIAGNOSIS — F1321 Sedative, hypnotic or anxiolytic dependence, in remission: Secondary | ICD-10-CM

## 2023-08-11 NOTE — Progress Notes (Signed)
 Daily Group Progress Note   Program: CD IOP     Group Time: 9 a.m. to 12 p.m.      Type of Therapy: Process and Psychoeducational    Topic: The therapists check in with group members, assess for SI/HI/psychosis and overall level of functioning. The therapists inquire about sobriety date and number of community support meetings attended since last session.       Therapists present Module 2 from [facility] for Treatment and Develop which involves three worksheets and prompts discussion on how group members evaluate their substance use The first is the [employer] which has two columns, one about good things about changing as it applies to use of substances and the second column being not so good things about changing the behavior as it applies to substance use.  The second worksheet is Personal Rulers Worksheets which has 3 questions: 1) How important is it that you make a change in your substance using. 2) If you decided that you wanted to quit using, how confident are yo that you could actually do it. And 3) How ready are you to make a change in your substance use. The third worksheet Benefits of Change entails 4 topics for clients to indicate what benefits they saw in recovery the 1) within a week, 2) within a month, #3) within a year and 4) In the long run.   Summary: Alicia Porter rates her depression as a 3 and her anxiety as a 5.  Alicia Porter reports the same sobriety date. She says she has not yet gone to any meetings, nor does she have a sponsor. She identifies her emotions as lonely. Vulnerable, some despair and accepting.  Alicia Porter shares that the major thing she loses when in active addiction is her spiritual connection. She answers the 3 questions on the personal rulers worksheet as 10. Alicia Porter says she is constantly triggered by her trauma.  She says she does not feel she can share it with the group. A peer tells her it is a safe place to talk.  Alicia Porter says her PTSD  separates her from others. Alicia Porter does share that Baclofen is helping her a lot.  Individual therapy appointment with Alicia Porter who offers August 16, 2023 at noon.      Progress Towards Goals: Alicia Porter reports no change in her sobriety date (08-02-23).   UDS collected: No   Results: None  AA/NA attended?:  No   Sponsor?:  No   Alicia Porter Simpler, MS, LMFT, LCAS  [address] [place], [place], [facility], LCAS 08/11/2023

## 2023-08-16 ENCOUNTER — Ambulatory Visit (HOSPITAL_COMMUNITY): Payer: MEDICAID

## 2023-08-16 ENCOUNTER — Telehealth (HOSPITAL_COMMUNITY): Payer: Self-pay | Admitting: Licensed Clinical Social Worker

## 2023-08-16 NOTE — Telephone Encounter (Signed)
 Shynice leaves a voicemail saying that she will not be in group today due to a family emergency.  Zell Maier, MA, LCSW, [facility], LCAS 08/16/2023

## 2023-08-16 NOTE — Telephone Encounter (Signed)
 Note in error.

## 2023-08-18 ENCOUNTER — Ambulatory Visit (HOSPITAL_COMMUNITY): Payer: MEDICAID

## 2023-08-18 ENCOUNTER — Telehealth (HOSPITAL_COMMUNITY): Payer: Self-pay | Admitting: Licensed Clinical Social Worker

## 2023-08-18 NOTE — Telephone Encounter (Signed)
 The therapist receives a voicemail from Alicia Porter stating that she will again not be in group today as she is still dealing with a family crisis but indicates that she should return to group on Friday.  Alicia Maier, MA, LCSW, [facility], LCAS 08/18/2023

## 2023-08-20 ENCOUNTER — Ambulatory Visit (INDEPENDENT_AMBULATORY_CARE_PROVIDER_SITE_OTHER): Payer: MEDICAID | Admitting: Licensed Clinical Social Worker

## 2023-08-20 DIAGNOSIS — F5025 Bulimia nervosa, in remission: Secondary | ICD-10-CM

## 2023-08-20 DIAGNOSIS — F4310 Post-traumatic stress disorder, unspecified: Secondary | ICD-10-CM

## 2023-08-20 DIAGNOSIS — F1020 Alcohol dependence, uncomplicated: Secondary | ICD-10-CM | POA: Diagnosis not present

## 2023-08-20 DIAGNOSIS — F1321 Sedative, hypnotic or anxiolytic dependence, in remission: Secondary | ICD-10-CM

## 2023-08-20 DIAGNOSIS — F3132 Bipolar disorder, current episode depressed, moderate: Secondary | ICD-10-CM

## 2023-08-20 NOTE — Progress Notes (Signed)
 Daily Group Progress Note   Program: CD IOP     Group Time: 9 a.m. to 12 p.m.      Type of Therapy: Process and Psychoeducational    Topic: The therapists check in with group members, assess for SI/HI/psychosis and overall level of functioning. The therapists inquire about sobriety date and number of community support meetings attended since last session.   The therapists provide feedback on how to find a sponsor.  They validates that interacting with the medical system on a regular basis can be frustrating and model how to assertively deal with problems they encounter.  They discussed the concept of learned helplessness and how to move beyond it.  They focused primarily on one of the 5 rules of recovery which is change your life.  They validate that in order to recover that 1 must get out of his comfort zone and must be willing to listen to the feedback given by those with long-term sobriety and the professionals with whom they work.  The therapist explained that the fact that AA is 54 years old with meetings available worldwide seems to suggest that it is offering something that people find actually works.  The therapists recommend that people in recovery not overthink taking action and say I will rather than I will try.     Summary: Alicia Porter returns to group today rating her depression as a 2 and her anxiety is a 4.  She says that she has not had any change in her sobriety date and has not been attending any meetings but says that it is on her list of things that she needs to do.  She says that she has started physical therapy and is getting needling done saying that it is wonderful and that she has not in so much pain.  She says that she needs to try something like tai chi as she believes that yoga would be too difficult for her.  Alicia Porter says that working on her physical health helps to alleviate some of the trauma stuff.  She says that when she was doing well in the past that she was  doing breath work and Administrator, sports.  In spite of having been out of group the past 2 sessions due to her dog having to be euthanized, she describes her mood as hopefuland peace.  Alicia Porter says that she is overeating and hiding food in her room admitting that she has a limiting belief about her appearance that is preventing her from getting out in public.  After receiving feedback from several of the women in the group and the group therapist, she concludes that she needs to start going to [employer] and doing some light exercise not allowing for physical appearance to prevent her from acting on this.    Progress Towards Goals: Alicia Porter reports no change in her sobriety date   UDS collected: No Results: Yes, negative for drugs and alcohol    AA/NA attended?:  No   Sponsor?:  No     Elsie Maier, MA, [place], [facility], LCAS Darice Simpler, LMFT, LCAS 08/20/2023

## 2023-08-23 ENCOUNTER — Ambulatory Visit (HOSPITAL_COMMUNITY): Payer: MEDICAID

## 2023-08-24 ENCOUNTER — Telehealth (HOSPITAL_COMMUNITY): Payer: Self-pay

## 2023-08-24 NOTE — Progress Notes (Unsigned)
 Therapist rosey Mems via telephone as she was a no show for CD IOP on 07-24-23. Therapist reaches Voice Mail and leaves a HIPAA complaint message requesting a return call.  Darice Simpler, MS, LMFT, LCAS

## 2023-08-24 NOTE — Telephone Encounter (Signed)
 Tamira was a no show for CD-IOP on 08-23-23. Therapist reaches out to her via telephone and reaches voice mail.  Therapist leaves a HIPAA compliant message requesting a return call.  Darice Simpler, MS, LMFT, LCAS

## 2023-08-25 ENCOUNTER — Ambulatory Visit (INDEPENDENT_AMBULATORY_CARE_PROVIDER_SITE_OTHER): Payer: MEDICAID

## 2023-08-25 DIAGNOSIS — F4310 Post-traumatic stress disorder, unspecified: Secondary | ICD-10-CM

## 2023-08-25 DIAGNOSIS — F1321 Sedative, hypnotic or anxiolytic dependence, in remission: Secondary | ICD-10-CM

## 2023-08-25 DIAGNOSIS — F1020 Alcohol dependence, uncomplicated: Secondary | ICD-10-CM | POA: Diagnosis not present

## 2023-08-25 DIAGNOSIS — F3132 Bipolar disorder, current episode depressed, moderate: Secondary | ICD-10-CM

## 2023-08-25 NOTE — Progress Notes (Signed)
 Daily Group Progress Note   Program: CD IOP     Group Time: 9 a.m. to 12 p.m.      Type of Therapy: Process and Psychoeducational    Topic: The therapists check in with group members, assess for SI/HI/psychosis and overall level of functioning. The therapists inquire about sobriety date and number of community support meetings attended since last session.      Introduced new group member. Therapist presents and prompts discussion on Alicia Rocker, MD, PHD Five Rules of Recovery, rule #2 which is be completely honest. Therapist discussing specifics of this rule including recovery requiring complete honesty, showing common sense about disclosing one's addiction and what it means to be 100% completely honest. Therapist discusses the importance and meaning of picking up a white chip in meetings.     Summary: Kanijah shares with the group and therapist that the reason she did not come on Monday was she had a bad migraine.  She said she was in such pain she did not think to call. Pattye rates her depression as a 3 and her anxiety as a 5. Lataunya says she has the same sobriety date. She is not attending meetings, nor does she have a sponsor.  She talked with other female peers in the group about getting a ride to a meeting and information was exchanged.  She shares her story with the newest member saying that she had a major relapse and found a program but did not feel it was a good fit and then she located this program and she felt welcomed.  She said she has done all kinds of drugs but always came back to alcohol . Atlee shares she has lot of home stress and this is causing her to have urges to drink. She notes if she had a car she would probably go get alcohol  or drugs.  A peer suggests that addicts are very resourceful and noted if she really wanted to use, she would find a way. Peer noted this was a smart choice to deal with this situation as life on life's terms.  She agreed to go to a  meeting. She says she wanted to be honest about her urges to drink since she knows the importance of honesty in recovery.   Progress Towards Goals: Jashira reports no change in her sobriety date (08-02-23).   UDS collected: No   Results: None  AA/NA attended?:  No   Sponsor?:  No   Darice Simpler, MS, LMFT, LCAS  [address] [place], [place], [facility], LCAS 08/25/2023

## 2023-08-27 ENCOUNTER — Ambulatory Visit (HOSPITAL_COMMUNITY): Payer: MEDICAID

## 2023-08-27 DIAGNOSIS — F3132 Bipolar disorder, current episode depressed, moderate: Secondary | ICD-10-CM

## 2023-08-27 DIAGNOSIS — F4310 Post-traumatic stress disorder, unspecified: Secondary | ICD-10-CM

## 2023-08-27 DIAGNOSIS — F1020 Alcohol dependence, uncomplicated: Secondary | ICD-10-CM

## 2023-08-27 DIAGNOSIS — F1321 Sedative, hypnotic or anxiolytic dependence, in remission: Secondary | ICD-10-CM
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
## 2023-08-30 ENCOUNTER — Ambulatory Visit (INDEPENDENT_AMBULATORY_CARE_PROVIDER_SITE_OTHER): Payer: MEDICAID | Admitting: Licensed Clinical Social Worker

## 2023-08-30 DIAGNOSIS — F1020 Alcohol dependence, uncomplicated: Secondary | ICD-10-CM | POA: Diagnosis not present

## 2023-08-30 DIAGNOSIS — F5025 Bulimia nervosa, in remission: Secondary | ICD-10-CM

## 2023-08-30 DIAGNOSIS — F4310 Post-traumatic stress disorder, unspecified: Secondary | ICD-10-CM

## 2023-08-30 DIAGNOSIS — F1321 Sedative, hypnotic or anxiolytic dependence, in remission: Secondary | ICD-10-CM

## 2023-08-30 DIAGNOSIS — F3132 Bipolar disorder, current episode depressed, moderate: Secondary | ICD-10-CM

## 2023-08-30 NOTE — Progress Notes (Signed)
 Daily Group Progress Note   Program: CD IOP     Group Time: 9 a.m. to 12 p.m.      Type of Therapy: Process and Psychoeducational    Topic: The therapists check in with group members, assess for SI/HI/psychosis and overall level of functioning. The therapists inquire about sobriety date and number of community support meetings attended since last session.   The therapists facilitate discussions on the following topics; the differences between helping versus enabling, how to reach out to others in the program to start making connections and what to say during the initial telephone contact, and the fact that active substance use impedes one's ability to successfully recover from trauma and grieve losses.  The therapists explain that in early recovery that people will have to force themselves to do things they do not want to do; however, over time they will eventually look forward to doing the things that they once had to force themselves to do.     Summary: Edit presents today rating her depression as a 4 and her anxiety as a 5.  She reports no change in her sobriety date and says that she attended a meeting on Saturday that was also.  She said that she the number of a woman who has 13 years of sobriety and believes that she may be a possible sponsor or at minimum a temporary sponsor.  She describes her mood as being hopeful and anxious.  She says that she spoke to her father and is concerned about he is getting the appropriate medical treatment needed to assist her mother.  Additionally, she says that her credit card is maxed out and that she broke a crown so will need some more dental work.  She says that she is worried over whether or not her insurance will cover some additional orthopedic treatment that she is needing.  During a discussion with the new group member who has recently started meetings, Cathren informs him that her former sponsor once told her that feelings are not facts and  that she has to get her recovery out of her head and into her heart.   Progress Towards Goals: Nahal reports no change in her sobriety date   UDS collected: Yes Results: No   AA/NA attended?:  Yes   Sponsor?:  No     Elsie Maier, MA, LCSW, [facility], LCAS Darice Simpler, LMFT, LCAS 08/30/2023

## 2023-09-01 ENCOUNTER — Ambulatory Visit (INDEPENDENT_AMBULATORY_CARE_PROVIDER_SITE_OTHER): Payer: MEDICAID | Admitting: Licensed Clinical Social Worker

## 2023-09-01 DIAGNOSIS — F3132 Bipolar disorder, current episode depressed, moderate: Secondary | ICD-10-CM

## 2023-09-01 DIAGNOSIS — F1020 Alcohol dependence, uncomplicated: Secondary | ICD-10-CM

## 2023-09-01 DIAGNOSIS — F1321 Sedative, hypnotic or anxiolytic dependence, in remission: Secondary | ICD-10-CM

## 2023-09-01 DIAGNOSIS — F4310 Post-traumatic stress disorder, unspecified: Secondary | ICD-10-CM

## 2023-09-01 DIAGNOSIS — F5025 Bulimia nervosa, in remission: Secondary | ICD-10-CM
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
## 2023-09-03 ENCOUNTER — Telehealth (HOSPITAL_COMMUNITY): Payer: Self-pay

## 2023-09-03 ENCOUNTER — Ambulatory Visit (INDEPENDENT_AMBULATORY_CARE_PROVIDER_SITE_OTHER): Payer: MEDICAID

## 2023-09-03 ENCOUNTER — Other Ambulatory Visit (HOSPITAL_COMMUNITY): Payer: Self-pay | Admitting: Medical

## 2023-09-03 DIAGNOSIS — F1020 Alcohol dependence, uncomplicated: Secondary | ICD-10-CM

## 2023-09-03 NOTE — Progress Notes (Signed)
 Daily Group Progress Note   Program: CD IOP     Group Time: 9 a.m. to 12 p.m.      Type of Therapy: Process and Psychoeducational    Topic: The therapists check in with group members, assess for SI/HI/psychosis and overall level of functioning. The therapists inquire about sobriety date and number of community support meetings attended since last session.   The therapists discuss the benefits of dealing with emotional pain in recovery, as opposed to using to cover up the pain. Those benefits include the following: enabling self awareness, provides insight for emotional triggers, fosters deeper connection with support system, and unlocks pain which also allows for feeling positive emotions. Therapists prompted discussion on how it feels to identify with emotional pain.       Summary: Alicia Porter presents today rating her depression as a 3 and her anxiety is a 5.  She reports the same sobriety date (08-10-23).  She attends the [facility] meeting on Saturdays and is talking to a potential sponsor. Alicia Porter discusses the importance of enjoying the journey of life and not focusing on the destination.   She says she is allowing herself to feel her feelings and is developing tools to deal with her emotions.  Alicia Porter says she was able to identify a nurse case mgr for her mother which she feels will be helpful as she has been feeling overwhelmed. She says the feeling of being overwhelmed was interfering with her serenity and she relapsed on nicotine .  Alicia Porter says she has 2 cigarettes to smoke and plans to stop after that.  Alicia Porter identifies her feelings as hopeful and anxious:. Alicia Porter says she feels she has made progress on her trauma since last October by physically removing herself from the trauma and focusing on self care.   Progress Towards Goals: Alicia Porter reports no change in her sobriety date   UDS collected: No Results: Negative   AA/NA attended?: is attending Women's only meetings on Saturdays only due to  lack of transportation   Sponsor?:  is talking to a potential sponsor   Darice Simpler, MS, LMFT, LCAS [address] [place], [place], [facility], LCAS 09/03/2023

## 2023-09-03 NOTE — Telephone Encounter (Signed)
 Therapist calls Lear at 2:53 pm to discuss individual therapy appointment. Reena answers and therapist confirms her identify by obtaining two verifiers. Therapist offer her a noon appointment on 09-06-23 for individual therapy and explained we can meet on Mondays as it will be easier to work out medicaid transportation to stay after IOP.    Darice Simpler, MS, LMFT, LCAS

## 2023-09-06 ENCOUNTER — Encounter (HOSPITAL_COMMUNITY): Payer: Self-pay | Admitting: Medical

## 2023-09-06 ENCOUNTER — Other Ambulatory Visit (HOSPITAL_COMMUNITY): Payer: Self-pay | Admitting: Medical

## 2023-09-06 ENCOUNTER — Ambulatory Visit (INDEPENDENT_AMBULATORY_CARE_PROVIDER_SITE_OTHER): Payer: MEDICAID | Admitting: Medical

## 2023-09-06 DIAGNOSIS — F22 Delusional disorders: Secondary | ICD-10-CM

## 2023-09-06 DIAGNOSIS — F418 Other specified anxiety disorders: Secondary | ICD-10-CM

## 2023-09-06 DIAGNOSIS — F1020 Alcohol dependence, uncomplicated: Secondary | ICD-10-CM

## 2023-09-06 DIAGNOSIS — F603 Borderline personality disorder: Secondary | ICD-10-CM

## 2023-09-06 DIAGNOSIS — F3132 Bipolar disorder, current episode depressed, moderate: Secondary | ICD-10-CM

## 2023-09-06 DIAGNOSIS — F4310 Post-traumatic stress disorder, unspecified: Secondary | ICD-10-CM

## 2023-09-06 DIAGNOSIS — F5025 Bulimia nervosa, in remission: Secondary | ICD-10-CM

## 2023-09-06 DIAGNOSIS — F1210 Cannabis abuse, uncomplicated: Secondary | ICD-10-CM

## 2023-09-06 DIAGNOSIS — F4321 Adjustment disorder with depressed mood: Secondary | ICD-10-CM

## 2023-09-06 DIAGNOSIS — F1321 Sedative, hypnotic or anxiolytic dependence, in remission: Secondary | ICD-10-CM

## 2023-09-06 MED ORDER — LAMOTRIGINE ER 200 MG PO TB24: 1.0000 | ORAL_TABLET | Freq: Every day | ORAL | 2 refills | Status: DC

## 2023-09-06 NOTE — Progress Notes (Signed)
 Daily Group Progress Note   Program: CD IOP     Group Time: 9 a.m. to 12 p.m.      Type of Therapy: Process and Psychoeducational    Topic: The therapists check in with group members, assess for SI/HI/psychosis and overall level of functioning. The therapists inquire about sobriety date and number of community support meetings attended since last session.   The therapists present information on the biology of addiction explaining from a brain-based perspective why avoiding triggers is critical to recovery. The therapists discuss the potential progression of the disease via showing the [facility]. The therapists explain how substance use disorders are diagnosed highlighting the symptom of loss of control in particular. The therapists discuss cross tolerance between benzodiazepines and alcohol  and present information concerning with withdrawal syndromes for different chemicals. The therapists stress that Step One is not just a thinking step but if taken is an action step. The therapists remind people that real progress is two steps forward and one step back. The therapists facilitate group discussion and feedback on these topics and discuss how to tell the difference between when one's healthy mind is talking and when the disease of addiction is talking.      Summary: Lakia presents today rating her depression as a 4 and her anxiety is an 8.  Prior to group check-in, she admits that she lied on her daily self inventory when she claimed to have not used any substances since last group admitting that she drank a beer on Saturday.  She describes her mood as guilty, anxiety, and a tiny bit of grief.  She then goes on to say that she has some hope.  She says that she wanted to go to a meeting on Saturday but did not do so because of sleeping poorly and then could not go on Sunday as her roommate did not sleep well.  Zeya says that she is grateful for the information regarding addiction being  a disease versus a character defect but at the same time admits that her chemical addiction, process addictions, and health concerns all seem to be too much for her roommate with Joyia saying that her roommate is her only support and that she has nowhere else to go.  The therapist attempts to instill hope that Jurni will make progress concerning all of her addictions if she is willing to stay the course, get back into meetings, get a sponsor, and do all of the things that her treatment providers encouraged her to do.  The therapist offers to facilitate a meeting with Sherrel and her roommate and talks about the fact that her roommate may need to be involved in some sort of of caretaker support group or treatment.  Shawndra eventually concludes that she will need to start being okay with attending some virtual meetings to supplement what few in person meetings she is able to attend with the therapist encouraging her to have an open mind about virtual meetings in order to avoid a self-fulfilling prophecy.  Progress Towards Goals: Lorretta reports that her new sobriety date is yesterday.    UDS collected: Yes Results: No   AA/NA attended?:  No   Sponsor?:  No     Elsie Maier, MA, [place], [facility], LCAS Darice Simpler, [place], LCAS 09/06/2023
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
## 2023-09-08 ENCOUNTER — Ambulatory Visit (INDEPENDENT_AMBULATORY_CARE_PROVIDER_SITE_OTHER): Payer: MEDICAID

## 2023-09-08 DIAGNOSIS — F1020 Alcohol dependence, uncomplicated: Secondary | ICD-10-CM | POA: Diagnosis not present

## 2023-09-08 DIAGNOSIS — F4321 Adjustment disorder with depressed mood: Secondary | ICD-10-CM

## 2023-09-08 DIAGNOSIS — F3132 Bipolar disorder, current episode depressed, moderate: Secondary | ICD-10-CM

## 2023-09-08 DIAGNOSIS — F4310 Post-traumatic stress disorder, unspecified: Secondary | ICD-10-CM

## 2023-09-08 DIAGNOSIS — F1321 Sedative, hypnotic or anxiolytic dependence, in remission: Secondary | ICD-10-CM

## 2023-09-08 LAB — TOXICOLOGY SCREEN, URINE: Creatinine, POC: 79 mg/dL

## 2023-09-08 NOTE — Progress Notes (Signed)
 Daily Group Progress Note   Program: CD IOP     Group Time: 9 a.m. to 12 p.m.      Type of Therapy: Process and Psychoeducational    Topic: The therapists check in with group members, assess for SI/HI/psychosis and overall level of functioning. The therapists inquire about sobriety date and number of community support meetings attended since last session.   Therapist present information on the following topics and prompts discussion among group members:  the important of having sober support,  the need to avoid people, places and things associated with use, how to play it forward to examine where one will end up if they continue their use, normalcy of having grief during a divorce, ASAM placement criteria and how decisions on placement are made, stages of change.   Summary: Alicia Porter presents today rating her depression as a 2 and her anxiety is a 2.  She reports the same sobriety She attends the [facility] meeting on Saturdays and now has a sponsor with whom she speaks to on a daily basis. Alicia Porter says she feels she may be on the right path again. She says she feelings incredibly blessed. Alicia Porter reports she is weaning herself off nicotine .  Alicia Porter reports she is working on her trauma and is setting a boundary with her parents. She says she emailed them to let them know she would be calling her mother once per week.  At the end of one month, she will reevaluate if she needs to keep this plan in place or if she needs to readjust it. Alicia Porter says she feels some guilt about this   Progress Towards Goals: Alicia Porter reports no change in her sobriety date   UDS collected: No Results: None   AA/NA attended?: is attending Women's only meetings on Saturdays only due to lack of transportation   Sponsor: Yes   Alicia Simpler, MS, LMFT, LCAS [address] [place], [place], [facility], LCAS 09/08/2023

## 2023-09-10 ENCOUNTER — Ambulatory Visit (INDEPENDENT_AMBULATORY_CARE_PROVIDER_SITE_OTHER): Payer: MEDICAID | Admitting: Licensed Clinical Social Worker

## 2023-09-10 DIAGNOSIS — F1020 Alcohol dependence, uncomplicated: Secondary | ICD-10-CM

## 2023-09-10 DIAGNOSIS — F5025 Bulimia nervosa, in remission: Secondary | ICD-10-CM

## 2023-09-10 DIAGNOSIS — F4310 Post-traumatic stress disorder, unspecified: Secondary | ICD-10-CM

## 2023-09-10 DIAGNOSIS — F3132 Bipolar disorder, current episode depressed, moderate: Secondary | ICD-10-CM

## 2023-09-10 DIAGNOSIS — F1321 Sedative, hypnotic or anxiolytic dependence, in remission: Secondary | ICD-10-CM
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
## 2023-09-13 ENCOUNTER — Ambulatory Visit (INDEPENDENT_AMBULATORY_CARE_PROVIDER_SITE_OTHER): Payer: MEDICAID | Admitting: Licensed Clinical Social Worker

## 2023-09-13 DIAGNOSIS — F4310 Post-traumatic stress disorder, unspecified: Secondary | ICD-10-CM

## 2023-09-13 DIAGNOSIS — F1020 Alcohol dependence, uncomplicated: Secondary | ICD-10-CM | POA: Diagnosis not present

## 2023-09-13 DIAGNOSIS — F1321 Sedative, hypnotic or anxiolytic dependence, in remission: Secondary | ICD-10-CM

## 2023-09-13 DIAGNOSIS — F3132 Bipolar disorder, current episode depressed, moderate: Secondary | ICD-10-CM

## 2023-09-13 DIAGNOSIS — F5025 Bulimia nervosa, in remission: Secondary | ICD-10-CM

## 2023-09-13 NOTE — Progress Notes (Signed)
 Daily Group Progress Note   Program: CD IOP     Group Time: 9 a.m. to 12 p.m.      Type of Therapy: Process and Psychoeducational    Topic: The therapists check in with group members, assess for SI/HI/psychosis and overall level of functioning. The therapists inquire about sobriety date and number of community support meetings attended since last session.   The therapists introduce a new group member. The therapists again explain that in order to be successful in obtaining long-term sobriety that people need  to stop using all addictive substances including cannabis and alcohol . The therapists explain that alcohol  disinhibits the prefrontal cortex further lessening what minimal willpower people have in early recovery. The therapists explain how living in emotionally toxic environments is toxic to recovery and show group members how to find [employer]. The therapists talk about the need to avoid people, places, and things and using rituals and facilitate a discussion about saying goodbye to one's disease and validating that it is a grieving process as not all parts of the disease were terrible. The therapists talk about Quitline [place] and how quitting tobacco increases the chance of staying sober from other substances. The therapists share a Just for Today NA reading on trust and the fact that people in the rooms are not perfect but infinitely more trustworthy than using associates.    Summary: Shervon presents today rating her depression as a 2 and her anxiety as a 4. She describes her mood as sleepy and eager. Lille admits to not feeling well today which is apparent from her affect throughout group. She says that she is taking hormone replacement therapy which she says makes her feel emotional. Towards the end of group, she says that the real reason she is likely not feeling well is that she was supposed to go to the pharmacy to pick up her medications on Friday relying on her roommate  to take her which she did not do. Dann says that her roommate is picking her up from group today so she is going to ask her to take her directly to the pharmacy is she is feeling flu-like symptoms in response to not having her anti-depressant.  Shaterrica says that she has a difficult situation at home as her roommate/spouse is severely depressed and just resuming an anti-depressant with Kyrsten saying that she has to pray for them.   On a positive note, she is working with her Sponsor who has her reading the first 164 pages of the Big Book in addition to doing five things for which she is grateful daily in addition to three things that she likes about herself. Avrielle says that in the past that she wasn't thinking clearly and got bad advice from people that had negative, major life consequences; however, she says that she is now ultimately making her own decisions being better able to do so as she is not using. She says that she is happy that she got help early and did not take the elevator to the bottom first.  She admits to struggling with some irrational guilt about setting limits with her parents and feeling disappointed that at least one of the women from group whom she contacted over the weekend who had relapsed did not show today. Shatana says that she was hoping to have one or two buddies with whom she can attend meetings. She also talks about wanting to learn to ride the bus saying that [place]'s bus schedule is difficult  and that her Sponsor or someone recommended having a Peer Support Specialist help her to learn this.    Progress Towards Goals: Neriah reports no change in her sobriety date.   UDS collected: Yes Results: No   AA/NA attended?:  Yes   Sponsor?:  Yes     Elsie Maier, MA, LCSW, [facility], LCAS Darice Simpler, LMFT, LCAS 09/13/2023

## 2023-09-14 ENCOUNTER — Telehealth (HOSPITAL_COMMUNITY): Payer: Self-pay

## 2023-09-14 NOTE — Telephone Encounter (Signed)
 Aneesha calls and leaves a message requesting a return call.  Therapist calls her and she answers. Therapist confirms her identity  by obtaining two verifiers.  Blaize said she had called because her pharmacy did not have extended hours and she needed her nausea medication for her headaches.  She said she was able to get the prescription transferred to the pharmacy with better hours so she was able to get the nausea medication. Halo asks if the other co-therapist in SA-IOP was trying to put her out of the group.  She says that she may be paranoid but wanted to check. This therapist assured her that no one is trying to put her out of group.  Therapist explains various situations in which therapists may recommend a different level of treatment, but emphasized that we do not put someone out of group that is making progress. Therapist did discuss how some insurance agencies will limit the amount of sessions they allow for SA-IOP but this is not a matter of putting someone out of group. She thanked this therapist for re-assuring her.  Darice Simpler, MS, LMFT, LCAS

## 2023-09-15 ENCOUNTER — Ambulatory Visit (INDEPENDENT_AMBULATORY_CARE_PROVIDER_SITE_OTHER): Payer: MEDICAID | Admitting: Medical

## 2023-09-15 ENCOUNTER — Encounter (HOSPITAL_COMMUNITY): Payer: Self-pay | Admitting: Medical

## 2023-09-15 DIAGNOSIS — F1020 Alcohol dependence, uncomplicated: Secondary | ICD-10-CM

## 2023-09-15 DIAGNOSIS — F1321 Sedative, hypnotic or anxiolytic dependence, in remission: Secondary | ICD-10-CM

## 2023-09-15 DIAGNOSIS — F3132 Bipolar disorder, current episode depressed, moderate: Secondary | ICD-10-CM

## 2023-09-15 DIAGNOSIS — F4321 Adjustment disorder with depressed mood: Secondary | ICD-10-CM

## 2023-09-15 DIAGNOSIS — F4310 Post-traumatic stress disorder, unspecified: Secondary | ICD-10-CM

## 2023-09-15 NOTE — Addendum Note (Signed)
 Addended by: LEILA CARLIN BRAVO on: 09/15/2023 05:22 PM   Modules accepted: Level of Service

## 2023-09-15 NOTE — Progress Notes (Signed)
 Daily Group Progress Note   Program: CD IOP     Group Time: 9 a.m. to 12 p.m.      Type of Therapy: Process and Psychoeducational    Topic: The therapists check in with group members, assess for SI/HI/psychosis and overall level of functioning. The therapists inquire about sobriety date and number of community support meetings attended since last session.   The therapist introduce the new group member.  Therapists asks group members to give an overview of what led them to CD-IOP so the new member could get to know them.  Therapist discusses the [place] curve, including the components of the progression of the disease, nothing there is a crucial Phase and a Chronic Phase.  Therapist points out that 1 out of 10 drinkers/substance users have the brain disease of addiction.  Therapist prompts discussion on the topics that fall under that crucial and Chronic Phases of Addiction.    Summary: Alicia Porter presents today rating her depression as a 0 and her anxiety as a 2.  Alicia Porter says she has not made it to a meeting since last group. She does have a sponsor and is communicating with her daily.  Alicia Porter identifies her emotions as thankful and eager. Alicia Porter shares some of her story noting she has worked toward recovery since age 69. Alicia Porter says as her disease of addiction progressed she had withdrawal symptoms to include DT's and became psychotic.  She says she ended up going to the hospital where she used to work. Alicia Porter says when she lived in [place]  the meetings were different. She says it was a cultural thing and it was accepted that people going to the meetings continued to use drug. She says she got sober for 6 years and then moved to [facility] where she she ended up in the hospital.  Alicia Porter calls back after group and asks if this therapist can meet with her after group to work on her trauma on the following days: August 11 and 18 at noon. And all the Mondays in September at noon.  Therapist tells Alicia Porter she  does not have conflicts with these days.   Progress Towards Goals: Alicia Porter reports no change in her sobriety date (09-05-23)  UDS collected: Yes Results: No   AA/NA attended?:  Yes   Sponsor?:  Yes     Elsie Maier, MA, LCSW, [facility], LCAS Darice Simpler, LMFT, LCAS 09/13/2023

## 2023-09-15 NOTE — Progress Notes (Addendum)
  Health Follow-up Outpatient CDIOP  Date: 09/15/2023 Admission Date:  Sobriety date:08/04/2023   Subjective: Ive got 10 days of sobriety and I feel stable mentally  HPI : CD IOP Provider FU   CDIOP FU 28 days days S/P admission and 10 days alcohol  free 2 with no sense of Dysthymia on Lamictal  at 200 mg ER.  Reports Trazodone  has been helpful.Wonders if Cymbalta  dose is too high triggering mood elevation?(-she did not attempt taper dicussed at admission)  She says her roommate observed 2 brief episodes of mania ?hypo but she reports stability today.  She is engaging in Individual counseling for her trauma with IOP Counselor Darice Simpler LCAS  Counselor's report: Summary: Alicia Porter presents today rating her depression as a 0 and her anxiety as a 2.  Alicia Porter says she has not made it to a meeting since last group. She does have a sponsor and is communicating with her daily.  Alicia Porter identifies her emotions as thankful and eager. Alicia Porter shares some of her story noting she has worked toward recovery since age 89. Alicia Porter says as her disease of addiction progressed she had withdrawal symptoms to include DT's and became psychotic.  She says she ended up going to the hospital where she used to work. Alicia Porter says when she lived in [place]  the meetings were different. She says it was a cultural thing and it was accepted that people going to the meetings continued to use drug. She says she got sober for 6 years and then moved to [facility] where she she ended up in the hospital.   Alicia Porter calls back after group and asks if this therapist can meet with her after group to work on her trauma on the following days: August 11 and 18 at noon. And all the Mondays in September at noon.  Therapist tells Alicia Porter she does not have conflicts with these days.     Review of Systems: Psychiatric: Agitation: See Counselor report Hallucination: No Depressed Mood: see Counselor reports Insomnia: Using  Trazodone  Hypersomnia: No Altered Concentration: No Feels Worthless:  Feeling bad about yourself - or that you are a failure or have let yourself or your family down Several days    Grandiose Ideas: No Belief In Special Powers: No New/Increased Substance Abuse: No Compulsions: In early withdrawal  Neurologic: Headache: No Seizure: No Paresthesias: No  Current Medications: Your Medication List baclofen  10 MG tablet Commonly known as: LIORESAL  Take 1 tablet (10 mg total) by mouth 3 (three) times daily.  Cholecalciferol 50 MCG (2000 UT) Caps Take 2,000 Units by mouth.  DULoxetine  30 MG capsule Commonly known as: CYMBALTA  Take 3 capsules (90 mg total) by mouth daily.  estrogen (conjugated)-medroxyprogesterone 0.625-2.5 MG tablet Commonly known as: PREMPRO Take 1 tablet by mouth daily.  LamoTRIgine  200 MG Tb24 24 hour tablet Take 1 tablet (200 mg total) by mouth daily.  lidocaine 2 % solution Commonly known as: XYLOCAINE SMARTSIG:5 Milliliter(s) By Mouth PRN  ondansetron  4 MG tablet Commonly known as: ZOFRAN  Take 4 mg by mouth every 8 (eight) hours as needed.  pantoprazole 40 MG tablet Commonly known as: PROTONIX Take 40 mg by mouth.  predniSONE  10 MG tablet Commonly known as: DELTASONE  40mg  daily x 7d then 30mg  daily x 7d then 20mg  daily x 7d then 10mg  daily x 7d  pregabalin 225 MG capsule Commonly known as: LYRICA Take 225 mg by mouth.  SUMAtriptan 20 MG/ACT nasal spray Commonly known as: IMITREX Place 20 mg into the nose.  traZODone   100 MG tablet Commonly known as: DESYREL  Take 100 mg by mouth.  triamterene-hydrochlorothiazide 37.5-25 MG tablet Commonly known as: MAXZIDE-25 Take 1 tablet by mouth daily.  Wegovy 1.7 MG/0.75ML Soaj SQ injection Generic drug: semaglutide-weight management Inject 1.7 mg into the skin.    Mental Status Examination  Appearance: Alert: Yes Attention: good  Cooperative: Yes Eye Contact: Good Speech: Clear and coherent, rate WNL Psychomotor  Activity: Normal Memory: Concentration/Attention: Normal/intact Oriented: person, place, time/date and situation Mood: Euthymic Affect: Appropriate and Congruent Thought Processes and Associations: Coherent and Intact Fund of Knowledge: Good Thought Content: WDL Insight:Limited Judgement:Impaired  LID:[id number]  PDMP: 09/13/2023 07/06/2023  1 Pregabalin 225 Mg Capsule 60.00   Diagnosis:  Alcohol  use disorder, severe, dependence (HCC) Severe sedative, hypnotic, or anxiolytic use disorder, in sustained remission (HCC) PTSD (post-traumatic stress disorder) Unresolved grief Bipolar affective disorder, currently depressed, moderate (HCC)  Assessment: Favorable progress early in treatment.?Cymbalta  ^mood  Treatment Plan:Per admission and Counselors FU 2 weeks/PRN    Carlin Emmer, PA-CPatient ID: Alicia Porter, female   DOB: May 12, 1970, 53 y.o.   MRN: [id number]

## 2023-09-16 LAB — TOXICOLOGY SCREEN, URINE: Creatinine, POC: 131 mg/dL

## 2023-09-17 ENCOUNTER — Ambulatory Visit (HOSPITAL_COMMUNITY): Payer: MEDICAID

## 2023-09-17 DIAGNOSIS — F4321 Adjustment disorder with depressed mood: Secondary | ICD-10-CM

## 2023-09-17 DIAGNOSIS — F1020 Alcohol dependence, uncomplicated: Secondary | ICD-10-CM | POA: Diagnosis not present

## 2023-09-17 DIAGNOSIS — F1321 Sedative, hypnotic or anxiolytic dependence, in remission: Secondary | ICD-10-CM

## 2023-09-17 DIAGNOSIS — F4310 Post-traumatic stress disorder, unspecified: Secondary | ICD-10-CM

## 2023-09-17 DIAGNOSIS — F3132 Bipolar disorder, current episode depressed, moderate: Secondary | ICD-10-CM

## 2023-09-17 NOTE — Progress Notes (Signed)
 Daily Group Progress Note   Program: CD IOP     Group Time: 9 a.m. to 12 p.m.      Type of Therapy: Process and Psychoeducational    Topic: The therapists check in with group members, assess for SI/HI/psychosis and overall level of functioning. The therapists inquire about sobriety date and number of community support meetings attended since last session.   The therapists explain the reason that this program is abstinence-based and does not admit individuals who are on prescribed controlled substances and/or self-medicating with marijuana for any sort of medical condition. The therapists begin to educate group members on the harmful effects of cannabis. The therapists answer group members' questions regarding MAT for opioid use disorders while also discussing Vivitrol  in relation to alcohol  use disorders. The therapists show group members a short video on the impact of baclofen  in reducing cravings for alcohol  and cocaine. The therapists answer group members' questions about the impact of drugs and alcohol  on the brain emphasizing that people's brain functioning improves the longer a person is sober and that so does the quality of their lives. The therapists encourage reaching out to sober supports to cope with life stressors versus using substances.    Summary: Alicia Porter presents today rating her depression as a 2 and her anxiety as a 4. She reports having the same sobriety date. She has not attended a meeting since last IOP due to lack of transportation. She says her sponsor suggested taking a bus but her partner does not think this is safe. Therapist inquired how important it is to stay in recovery and she says she has not thought about it in that way.  [name] identifies her emotions today as isolated and thankful. [name] says she is grateful that she has access to medical care but feels isolated because she cannot get to in person meetings.  [name] says she is working with her sponsor.  She says  she has not been able to talk to her this week because her sponsor's voice mail is full. [name] says she texts her to let her know this but the voice mail has not been emptied. [name] says her sponsor has her to write 5 things she is grateful for and 3 things she likes about herself on a daily basis.   Progress Towards Goals: [name] reports no change in her sobriety date.   UDS collected: No Results: Negative   AA/NA attended?:  not since last IOP due to lack of transportation   Sponsor?:  Yes    Darice Simpler, MS, LMFT, LCAS [address], [place], [place], [facility], LCAS  09/17/2023

## 2023-09-20 ENCOUNTER — Telehealth (HOSPITAL_COMMUNITY): Payer: Self-pay

## 2023-09-20 ENCOUNTER — Ambulatory Visit (INDEPENDENT_AMBULATORY_CARE_PROVIDER_SITE_OTHER): Payer: MEDICAID | Admitting: Licensed Clinical Social Worker

## 2023-09-20 DIAGNOSIS — F5025 Bulimia nervosa, in remission: Secondary | ICD-10-CM

## 2023-09-20 DIAGNOSIS — F3132 Bipolar disorder, current episode depressed, moderate: Secondary | ICD-10-CM

## 2023-09-20 DIAGNOSIS — F1321 Sedative, hypnotic or anxiolytic dependence, in remission: Secondary | ICD-10-CM

## 2023-09-20 DIAGNOSIS — F1020 Alcohol dependence, uncomplicated: Secondary | ICD-10-CM | POA: Diagnosis not present

## 2023-09-20 DIAGNOSIS — F4310 Post-traumatic stress disorder, unspecified: Secondary | ICD-10-CM

## 2023-09-20 NOTE — Progress Notes (Signed)
 Daily Group Progress Note   Program: CD IOP     Group Time: 9 a.m. to 12 p.m.      Type of Therapy: Process and Psychoeducational    Topic: The therapists check in with group members, assess for SI/HI/psychosis and overall level of functioning. The therapists inquire about sobriety date and number of community support meetings attended since last session.   The therapists show a video of Dr. Franky High entitled, New Perspectives on Addition & Recovery and Delbert Boone's The Psychology of Addiction. The therapists facilitate group discussion concerning these videos while presenting data concerning the health risks of cannabis use and the pitfalls associated with attempting to be [place]  Sober. The therapists explain the brain-based reason that people in recovery must treat all addictive substances as potentially dangerous and thus avoid them in addition to talking about process addictions.     Summary: Alicia Porter presents today rating her depression as 0 and her anxiety as a 2.  She talks to her Sponsor daily doing her gratitude list and things she likes about herself. She is about half-way through the 180 pages her Sponsor wanted her to read.  She discovered that another group member, who has the same insurance that she does, it able to get rides to meetings paid for by their insurance so is going to pursue this being excited about it as she wants to attend meetings daily. Her roommate told her that she did not want her to go to a particular meeting as it is not in a good neighborhood and told Alicia Porter that she needs to find rides but Alicia Porter does not want her riding to meetings with other newcomers. Additionally, when Alicia Porter looked into a 50 cc scooter, her roommate did not want her doing this as well. The therapist observes that her roommate seemed to leave her with no options so it is fortuitous that her insurance can help pay to get her to meetings.   Alicia Porter describes her  mood a hopeful and encouraged. She actively participates in group discussion noting that she enjoyed the content on Dopamine and learned a lot.   Progress Towards Goals: Alicia Porter reports no change in her sobriety date.   UDS collected: Yes Results: No   AA/NA attended?:  Yes   Sponsor?:  Yes     Alicia Maier, MA, LCSW, [facility], LCAS Alicia Simpler, LMFT, LCAS 09/20/2023

## 2023-09-20 NOTE — Telephone Encounter (Signed)
 Marijean calls after group and apologizes for not being able to stay for her individual as her [place] ride came an hour early and was not willing to wait. She asks if this therapist has Wednesday, however given the number of CCA's, therapist explains she will need to see Novalie next Monday at noon after group. She thanked this therapist for calling.  Darice Kennie Snedden,MS, LMFT, LCAS

## 2023-09-21 ENCOUNTER — Telehealth (HOSPITAL_COMMUNITY): Payer: Self-pay

## 2023-09-21 NOTE — Telephone Encounter (Signed)
 Alicia Porter calls front desk who transfers Alicia Porter to this therapist. Therapist confirms her identity by obtaining two verifiers. Alicia Porter says [place] coded her ride incorrectly so she does not have Primary school teacher. She says Alicia Porter is saying she needs to be on a a 3 way call with someone here letting them know she needs to attend tomorrow. Therapist asks her to call [facility] transportation and have them 3 way call this therapist.  Alicia Porter hangs up but calls back and says she talked to a different person who put in the transportation order as needed so no 3 way call was needed.  Darice Simpler, MS, LMFT, LCAS

## 2023-09-22 ENCOUNTER — Telehealth (HOSPITAL_COMMUNITY): Payer: Self-pay

## 2023-09-22 ENCOUNTER — Telehealth (HOSPITAL_COMMUNITY): Payer: Self-pay | Admitting: Licensed Clinical Social Worker

## 2023-09-22 ENCOUNTER — Ambulatory Visit (HOSPITAL_COMMUNITY): Payer: MEDICAID

## 2023-09-22 DIAGNOSIS — F418 Other specified anxiety disorders: Secondary | ICD-10-CM

## 2023-09-22 DIAGNOSIS — Z5320 Procedure and treatment not carried out because of patient's decision for unspecified reasons: Secondary | ICD-10-CM

## 2023-09-22 DIAGNOSIS — F1321 Sedative, hypnotic or anxiolytic dependence, in remission: Secondary | ICD-10-CM

## 2023-09-22 DIAGNOSIS — Z9989 Dependence on other enabling machines and devices: Secondary | ICD-10-CM

## 2023-09-22 DIAGNOSIS — F4310 Post-traumatic stress disorder, unspecified: Secondary | ICD-10-CM

## 2023-09-22 DIAGNOSIS — F1210 Cannabis abuse, uncomplicated: Secondary | ICD-10-CM

## 2023-09-22 DIAGNOSIS — F4321 Adjustment disorder with depressed mood: Secondary | ICD-10-CM

## 2023-09-22 DIAGNOSIS — F5025 Bulimia nervosa, in remission: Secondary | ICD-10-CM

## 2023-09-22 DIAGNOSIS — F1020 Alcohol dependence, uncomplicated: Secondary | ICD-10-CM

## 2023-09-22 DIAGNOSIS — F3132 Bipolar disorder, current episode depressed, moderate: Secondary | ICD-10-CM

## 2023-09-22 DIAGNOSIS — F22 Delusional disorders: Secondary | ICD-10-CM

## 2023-09-22 DIAGNOSIS — F603 Borderline personality disorder: Secondary | ICD-10-CM
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
## 2023-09-22 NOTE — Telephone Encounter (Deleted)
 Alicia Porter calls and leaves a message that she missed today due to having a migrane. She says it got better and she is going to drop out of IOP and wants individual each Monday.  Therapist returns the call and confirms she is speaking with the correct person by verifying her identity.  Alicia Porter says she will have to stop coming to IOP due

## 2023-09-22 NOTE — Telephone Encounter (Signed)
 Note in error.

## 2023-09-22 NOTE — Telephone Encounter (Signed)
 Alicia Porter calls at 6:24 am and leaves a message saying she does not feel good today but will be in group this a.m.  Darice Simpler, MS, LMFT, LCAS

## 2023-09-24 ENCOUNTER — Ambulatory Visit (HOSPITAL_COMMUNITY): Payer: MEDICAID
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
## 2023-09-27 ENCOUNTER — Ambulatory Visit (HOSPITAL_COMMUNITY): Payer: MEDICAID

## 2023-09-28 ENCOUNTER — Other Ambulatory Visit: Payer: Self-pay | Admitting: Medical Genetics

## 2023-09-28 ENCOUNTER — Encounter (HOSPITAL_COMMUNITY): Payer: Self-pay

## 2023-09-28 ENCOUNTER — Other Ambulatory Visit: Payer: Self-pay

## 2023-09-28 ENCOUNTER — Emergency Department (HOSPITAL_COMMUNITY): Payer: MEDICAID

## 2023-09-28 ENCOUNTER — Emergency Department (HOSPITAL_COMMUNITY)
Admission: EM | Admit: 2023-09-28 | Discharge: 2023-09-28 | Disposition: A | Discharge status: Home / Self Care | Payer: MEDICAID | Source: Ambulatory Visit | Attending: Emergency Medicine | Admitting: Emergency Medicine

## 2023-09-28 DIAGNOSIS — I10 Essential (primary) hypertension: Secondary | ICD-10-CM | POA: Diagnosis not present

## 2023-09-28 DIAGNOSIS — M48061 Spinal stenosis, lumbar region without neurogenic claudication: Secondary | ICD-10-CM | POA: Diagnosis not present

## 2023-09-28 DIAGNOSIS — M545 Low back pain, unspecified: Secondary | ICD-10-CM | POA: Diagnosis present

## 2023-09-28 LAB — COMPREHENSIVE METABOLIC PANEL WITH GFR
ALT: 30 U/L (ref 0–44)
AST: 28 U/L (ref 15–41)
Albumin: 3.8 g/dL (ref 3.5–5.0)
Alkaline Phosphatase: 70 U/L (ref 38–126)
Anion gap: 11 (ref 5–15)
BUN: 17 mg/dL (ref 6–20)
CO2: 28 mmol/L (ref 22–32)
Calcium: 9.5 mg/dL (ref 8.9–10.3)
Chloride: 100 mmol/L (ref 98–111)
Creatinine, Ser: 0.76 mg/dL (ref 0.44–1.00)
GFR, Estimated: >60 mL/min (ref >60)
Glucose, Bld: 107 mg/dL — ABNORMAL HIGH (ref 70–99)
Potassium: 3.8 mmol/L (ref 3.5–5.1)
Sodium: 139 mmol/L (ref 135–145)
Total Bilirubin: 0.5 mg/dL (ref 0.0–1.2)
Total Protein: 7 g/dL (ref 6.5–8.1)

## 2023-09-28 LAB — CBC WITH DIFFERENTIAL/PLATELET
Abs Immature Granulocytes: 0.02 K/uL (ref 0.00–0.07)
Basophils Absolute: 0.1 K/uL (ref 0.0–0.1)
Basophils Relative: 1 %
Eosinophils Absolute: 0.2 K/uL (ref 0.0–0.5)
Eosinophils Relative: 2 %
HCT: 42.5 % (ref 36.0–46.0)
Hemoglobin: 13.2 g/dL (ref 12.0–15.0)
Immature Granulocytes: 0 %
Lymphocytes Relative: 28 %
Lymphs Abs: 2 K/uL (ref 0.7–4.0)
MCH: 31.2 pg (ref 26.0–34.0)
MCHC: 31.1 g/dL (ref 30.0–36.0)
MCV: 100.5 fL — ABNORMAL HIGH (ref 80.0–100.0)
Monocytes Absolute: 0.5 K/uL (ref 0.1–1.0)
Monocytes Relative: 7 %
Neutro Abs: 4.4 K/uL (ref 1.7–7.7)
Neutrophils Relative %: 62 %
Platelets: 299 K/uL (ref 150–400)
RBC: 4.23 MIL/uL (ref 3.87–5.11)
RDW: 15.2 % (ref 11.5–15.5)
WBC: 7.1 K/uL (ref 4.0–10.5)
nRBC: 0 % (ref 0.0–0.2)

## 2023-09-28 LAB — URINALYSIS, ROUTINE W REFLEX MICROSCOPIC
Bacteria, UA: NONE SEEN
Glucose, UA: NEGATIVE mg/dL
Hgb urine dipstick: NEGATIVE
Ketones, ur: 5 mg/dL — AB
Leukocytes,Ua: NEGATIVE
Nitrite: NEGATIVE
Protein, ur: NEGATIVE mg/dL
Specific Gravity, Urine: 1.029 (ref 1.005–1.030)
pH: 5 (ref 5.0–8.0)

## 2023-09-28 MED ORDER — METHYLPREDNISOLONE 4 MG PO TBPK: ORAL_TABLET | ORAL | 0 refills | Status: DC

## 2023-09-28 MED ORDER — HYDROCODONE-ACETAMINOPHEN 5-325 MG PO TABS
1.0000 | ORAL_TABLET | Freq: Four times a day (QID) | ORAL | 0 refills | Status: DC | PRN
Start: 2023-09-28 — End: 2023-11-18

## 2023-09-28 MED ORDER — MORPHINE SULFATE (PF) 4 MG/ML IV SOLN
4.0000 mg | Freq: Once | INTRAVENOUS | Status: AC
  Administered 2023-09-28: 4 mg via INTRAVENOUS
  Filled 2023-09-28: qty 1

## 2023-09-28 MED ORDER — METHYLPREDNISOLONE 4 MG PO TBPK
ORAL_TABLET | ORAL | 0 refills | Status: DC
Start: 2023-09-28 — End: 2023-10-21

## 2023-09-28 MED ORDER — HYDROCODONE-ACETAMINOPHEN 5-325 MG PO TABS: 1.0000 | ORAL_TABLET | Freq: Four times a day (QID) | ORAL | 0 refills | Status: DC | PRN

## 2023-09-28 MED ORDER — METHYLPREDNISOLONE SODIUM SUCC 125 MG IJ SOLR
125.0000 mg | Freq: Once | INTRAMUSCULAR | Status: AC
  Administered 2023-09-28: 125 mg via INTRAVENOUS
  Filled 2023-09-28: qty 2

## 2023-09-28 NOTE — ED Triage Notes (Signed)
 Pt reports with chronic lower back pain that has gotten worse over the past 2 weeks. Pt states that she has been urinating and having bowel movements on herself. Pt's physical therapist told her to come here.

## 2023-09-28 NOTE — ED Notes (Signed)
 Pt currently in MRI

## 2023-09-28 NOTE — Discharge Instructions (Addendum)
 As we discussed, you have spinal stenosis at L4 and 5  Take Vicodin as needed for pain.  Please take Medrol  Dosepak to help with inflammation  Follow-up with your spine doctor at [facility]  Return to ER if you have worse pain or incontinence or trouble urinating or trouble walking

## 2023-09-28 NOTE — ED Provider Notes (Signed)
 [place] [facility]ERGENCY DEPARTMENT AT [facility] Provider Note   CSN: [id number] Arrival date & time: 09/28/23  9090     Patient presents with: Back Pain   Alicia Porter is a 53 y.o. female history of hypertension, here presenting with back pain and trouble urinating.  Patient states that she has been having back pain and right hip pain for several months.  She is actually scheduled for MRI outpatient but did not get insurance approval.  She states that, a week ago she started having urinary and fecal incontinence.  She states that she does not know that she has to urinate and noticed that her underwear is wet.  She also defecates on herself.  She talked to her physical therapist and was sent here for MRI to rule out cauda equina.  She states that she also has pain radiate down the right leg.   The history is provided by the patient.       Prior to Admission medications   Medication Sig Start Date End Date Taking? Authorizing Provider  baclofen  (LIORESAL ) 10 MG tablet Take 1 tablet (10 mg total) by mouth 3 (three) times daily. 08/06/23 11/04/23  Kober, Charles E, PA-C  Cholecalciferol 50 MCG (2000 UT) CAPS Take 2,000 Units by mouth. 03/12/22   [provider]  DULoxetine  (CYMBALTA ) 30 MG capsule Take 3 capsules (90 mg total) by mouth daily. 08/06/23 11/04/23  Kober, Charles E, PA-C  estrogen, conjugated,-medroxyprogesterone (PREMPRO) 0.625-2.5 MG tablet Take 1 tablet by mouth daily. 08/03/23   [provider]  LamoTRIgine  200 MG TB24 24 hour tablet Take 1 tablet (200 mg total) by mouth daily. 09/06/23 12/05/23  Kober, Charles E, PA-C  lidocaine (XYLOCAINE) 2 % solution SMARTSIG:5 Milliliter(s) By Mouth PRN 06/10/23   [provider]  ondansetron  (ZOFRAN ) 4 MG tablet Take 4 mg by mouth every 8 (eight) hours as needed. 05/04/23   [provider]  pantoprazole (PROTONIX) 40 MG tablet Take 40 mg by mouth.    [provider]  predniSONE   (DELTASONE ) 10 MG tablet 40mg  daily x 7d then 30mg  daily x 7d then 20mg  daily x 7d then 10mg  daily x 7d 07/13/23   [provider]  pregabalin (LYRICA) 225 MG capsule Take 225 mg by mouth. 07/06/23 07/05/24  [provider]  SUMAtriptan (IMITREX) 20 MG/ACT nasal spray Place 20 mg into the nose. 08/24/23   [provider]  traZODone  (DESYREL ) 100 MG tablet Take 100 mg by mouth. 08/09/23   [provider]  triamterene-hydrochlorothiazide (MAXZIDE-25) 37.5-25 MG tablet Take 1 tablet by mouth daily. 09/02/23 12/31/23  [provider]  WEGOVY 1.7 MG/0.75ML SOAJ Inject 1.7 mg into the skin. 07/14/23   [provider]    Allergies: Levofloxacin and Wellbutrin [bupropion hcl]    Review of Systems  Musculoskeletal:  Positive for back pain.  All other systems reviewed and are negative.   Updated Vital Signs BP (!) 139/100 (BP Location: Left Arm)   Pulse 96   Temp 97.9 F (36.6 C) (Oral)   Resp 18   SpO2 97%   Physical Exam Vitals and nursing note reviewed.  Constitutional:      Appearance: Normal appearance.  HENT:     Head: Normocephalic.     Nose: Nose normal.     Mouth/Throat:     Mouth: Mucous membranes are moist.  Eyes:     Extraocular Movements: Extraocular movements intact.     Pupils: Pupils are equal, round, and reactive  to light.  Cardiovascular:     Rate and Rhythm: Normal rate and regular rhythm.     Pulses: Normal pulses.     Heart sounds: Normal heart sounds.  Pulmonary:     Effort: Pulmonary effort is normal.     Breath sounds: Normal breath sounds.  Abdominal:     General: Abdomen is flat.     Palpations: Abdomen is soft.  Musculoskeletal:     Cervical back: Normal range of motion and neck supple.     Comments: Lower lumbar tenderness.  Patient has no saddle anesthesia.  Positive straight leg raise on the right  Skin:    General: Skin is warm.     Capillary Refill: Capillary refill takes less than 2 seconds.   Neurological:     General: No focal deficit present.     Mental Status: She is alert and oriented to person, place, and time.     Comments: No saddle anesthesia.  Normal reflexes bilateral knees.  Psychiatric:        Mood and Affect: Mood normal.        Behavior: Behavior normal.     (all labs ordered are listed, but only abnormal results are displayed) Labs Reviewed  CBC WITH DIFFERENTIAL/PLATELET  COMPREHENSIVE METABOLIC PANEL WITH GFR  URINALYSIS, ROUTINE W REFLEX MICROSCOPIC    EKG: None  Radiology: No results found.   Procedures   Medications Ordered in the ED  morphine  (PF) 4 MG/ML injection 4 mg (has no administration in time range)                                    Medical Decision Making Alicia Porter is a 53 y.o. female here presenting with back pain and incontinence.  Concern for possible cauda equina syndrome.  Will get MRI without contrast and basic lab work.  Will also give pain medicine.  1:52 PM I reviewed patient's labs and independent interpreted imaging studies.  Labs and UA unremarkable.  MRI showed L4-5 stenosis with no nerve root impingement.  Patient's pain is under control now.  Will discharge home with a course of steroids and pain medicine.  Patient is not taking any pain medicine for now  Problems Addressed: Spinal stenosis at L4-L5 level: acute illness or injury  Amount and/or Complexity of Data Reviewed Labs: ordered. Radiology: ordered.  Risk Prescription drug management.    Final diagnoses:  None    ED Discharge Orders     None          Patt Alm Macho, MD 09/28/23 1353

## 2023-09-29 ENCOUNTER — Ambulatory Visit (HOSPITAL_COMMUNITY): Payer: MEDICAID

## 2023-09-29 NOTE — Addendum Note (Signed)
 Addended by: LEILA CARLIN BRAVO on: 09/29/2023 11:27 AM   Modules accepted: Orders

## 2023-09-30 ENCOUNTER — Encounter: Payer: Self-pay | Admitting: Medical

## 2023-09-30 ENCOUNTER — Ambulatory Visit (HOSPITAL_COMMUNITY): Payer: MEDICAID | Admitting: Medical

## 2023-10-01 ENCOUNTER — Ambulatory Visit (HOSPITAL_COMMUNITY): Payer: MEDICAID

## 2023-10-04 ENCOUNTER — Ambulatory Visit (HOSPITAL_COMMUNITY): Payer: MEDICAID

## 2023-10-06 ENCOUNTER — Ambulatory Visit (HOSPITAL_COMMUNITY): Payer: MEDICAID

## 2023-10-07 ENCOUNTER — Ambulatory Visit (HOSPITAL_COMMUNITY): Payer: MEDICAID | Admitting: Medical

## 2023-10-07 NOTE — Progress Notes (Addendum)
 [facility]  [facility]  New Patient Consultation Note  Referring Doctor: Celestia Vernell Conroy* Primary Doctor: Richerd Ship, MD  Alicia Porter is a 53 y.o. old female being seen today by Shelly Charity MD at the consultation request of Celestia Vernell Conroy* for new patient office visit.  Medical records reviewed today for this visit: Emergency Department, Orthopedic, and Physical Therapy  Chief Complaint:  Chief Complaint  Patient presents with  . New Patient  . Back Pain    lower  . Hip Pain    bilateral  . Leg Pain    right   History of Present Illness:  Patient is here for discussion of potential interventional procedures to address her chronic low back pain, hip pain and right leg pain.  She reports that her versus on the hips are the most symptomatic at this time.  Patient denies fevers, chills, ataxia, falls, progressive weakness, saddle anesthesia, and bladder/bowel changes. Additional pain history/assessment/scores noted below:  Per review of intake: Pain Description: The pain started 1-2 years ago after no specific event. Since the pain first started, it is worse. The most significant location of pain is the lower back, bilateral buttocks Other areas of pain include the bilateral hip, right leg.  The 1-2 words that best describe the pain: sharp, stabbing, aching, prickling, tingling, and off and on. The pain improves with prescription pain medications, physical therapy, heat therapy, and cold therapy, dry needling The pain is made worse with walking and standing. The average daily pain score is 5/10. The pain can be as high as 7/10 at its worst. The pain interferes with: All activities and Working.   Therapies: Previously attempted interventions for this pain concern: joint injections   Has physical therapy been initiated or completed for this pain concern? Yes, for two or more sessions Where was physical  therapy completed? At a [facility] based clinic Has a home exercise program (directed by a physical therapist or medical provider) been completed for this pain concern? Yes   Has chiropractic been completed for this pain concern? No   Medications: Currently utilized over the counter medications/therapy for pain: None   Currently utilized prescription medications/therapies for pain: Muscle relaxers   Currently utilized controlled medications: None   Is the patient on a blood thinner (including aspirin, Goody/BC/Bayer powder)? No  A validated pain assessment tool was completed today: What number best describes your pain on average in the past week?: 5 What number best describes how, during the past week, pain has interfered with your enjoyment of life?: 5 What number best describes how, during the past week, pain has interfered with your general activity?: 5 PEG Pain Total Score: 5   Review of Systems: A complete ROS was performed with positive/negative pertinent findings listed in HPI.  ____________________________________________________________________ Past Medical History: Medical History[1]   Past Surgical History: Surgical History[2]  Family History: Family History[3]  Social History: Social History   Tobacco Use  . Smoking status: Some Days    Current packs/day: 0.00    Average packs/day: 1 pack/day for 35.0 years (35.0 ttl pk-yrs)    Types: Cigarettes    Start date: 02/10/1987    Last attempt to quit: 05/11/2022    Years since quitting: 1.4    Passive exposure: Past  . Smokeless tobacco: Never  . Tobacco comments:    Per 2024 LS form/chart (1) quit at age 52  Substance Use Topics  . Alcohol  use: Not  Currently    Alcohol /week: 2.0 standard drinks of alcohol     Types: 2 Cans of beer per week    Comment: X 6 events last 6 months.    Allergies: Baclofen , Beesix, Levofloxacin, Sulfa (sulfonamide antibiotics), Wellbutrin [bupropion hcl], Aripiprazole , Asenapine,  Bupropion, Animal dander, Hornet venom, Pollens extract, Prozac  [fluoxetine ], and Risperidone   Current Medications: Current Medications[4]  Review of Systems:  ROS completed on pain intake sheet reviewed, pertinent positive/negative findings related to this visit are noted in the HPI and Assessment/Plan.  ____________________________________________________________________ Vitals: Vitals:   10/12/23 0818  BP: 123/74  BP Location: Left arm  Patient Position: Sitting  Pulse: 87  Resp: 20  Temp: 97.2 F (36.2 C)  TempSrc: Temporal  SpO2: 95%  Weight: 114 kg (252 lb)    Physical Exam: General: no acute distress, non-diaphoretic HENT: Anicteric, no conjunctivitis, moist mucous membranes, hearing intact NECK: no erythema noted at the neck, trachea midline Cardiovascular: adequate capillary refill Respiratory: normal effort, no tactile fremitus Musculoskeletal exam: Hip: Tender over bilateral greater trochanteric bursa Lumbar Spine: Observation:  Nontender to palpation over spinous processes. No overt step-off noted.  Tender over bilateral lumbar paraspinals with positive facet loading test Range of motion in Flexion/Extension/Lateral Flexion/rotation were limited in all planes. Sacroiliac Joint: No erythema.  Tender over bilateral sacroiliac joints and Serano ligamentous complex Neurologic: Normal gait sensation to light touch grossly intact. Reflexes were 1+/4+ and symmetric  Motor: Moving all extremities gravity Patient transfers independently from seated to standing, and standing to seated.   Psychiatric: Cooperative; awake, alert, and oriented to person, place and time. Appropriate mood and affect. Skin: warm, dry  ____________________________________________________________________ Controlled Substance Monitoring: NCPDMP: Reviewed Yes  UDS (Urine Drug Screen) & Results: Pain Management Panel       * No data to display           ____________________________________________________________________ Imaging and Diagnostic Studies:  All applicable diagnostic studies related to this consultation have been reviewed. Relevant diagnostic reports and/or my personal review of imaging or other diagnostic studies listed below: EXAM:  MRI LUMBAR SPINE  09/28/2023 12:01:51 PM   TECHNIQUE:  Multiplanar multisequence MRI of the lumbar spine was performed without the  administration of intravenous contrast.   COMPARISON:  None available.   CLINICAL HISTORY:  Low back pain, cauda equina syndrome suspected. Chronic lower back pain that  has gotten worse over the past 2 weeks. Patient states that she has been  urinating and having bowel movements on herself. Patient's physical therapist  told her to come here.   FINDINGS:   BONES AND ALIGNMENT:  There is degenerative anterolisthesis at L4-5.   SPINAL CORD:  The conus medullaris terminates at L1.   SOFT TISSUES:  No paraspinal mass.   L1-L2:  No significant disc herniation. No spinal canal stenosis or neural foraminal  narrowing.   L2-L3:  No significant disc herniation. No spinal canal stenosis or neural foraminal  narrowing.   L3-L4:  No significant disc herniation. No spinal canal stenosis or neural foraminal  narrowing.   L4-L5:  There is moderate bilateral facet arthrosis and there is mild central spinal  canal and bilateral lateral recess stenosis. There is no apparent nerve root  impingement.   L5-S1:  No significant disc herniation. No spinal canal stenosis or neural foraminal  narrowing.   IMPRESSION:  1. Mild central spinal canal and bilateral lateral recess stenosis at L4-5  without apparent nerve root impingement.  2. Degenerative anterolisthesis at L4-5 with moderate bilateral facet  arthrosis.  I personally reviewed imaging findings ____________________________________________________________________ Relevant Labs Reviewed:   Lab  Results  Component Value Date   CREATININE 0.91 09/27/2023    Lab Results  Component Value Date   WBC 8.60 09/27/2023   HGB 13.8 09/27/2023   HCT 41.6 09/27/2023   MCV 96.9 (H) 09/27/2023   PLT 325 09/27/2023    Lab Results  Component Value Date   HGBA1C 6.1 (H) 09/27/2023   ____________________________________________________________________  Diagnosis: 1. Greater trochanteric bursitis, unspecified laterality  Pain Joint, Bursa, Tendon, Ligament Injection Bursa; Major; Greater Trochanteric; Bilateral; Fluoroscopy   CANCELED: Pain Joint, Bursa, Tendon, Ligament Injection Bursa; Major; Greater Trochanteric; Bilateral; Fluoroscopy    2. Chronic bilateral low back pain, unspecified whether sciatica present  Pain Joint, Bursa, Tendon, Ligament Injection Ligament; Posterior sacroiliac ligament; Bilateral; Fluoroscopy    3. Sacrococcygeal disorders, not elsewhere classified  Pain Joint, Bursa, Tendon, Ligament Injection Ligament; Posterior sacroiliac ligament; Bilateral; Fluoroscopy    4. Dorsalgia of lumbar region  Pain Joint, Bursa, Tendon, Ligament Injection Ligament; Posterior sacroiliac ligament; Bilateral; Fluoroscopy       Assessment and Plan: Alicia Porter is a 53 y.o. old female referred from [place], Vernell Conroy* presenting for new patient consultation. She reports that her lateral hips of the most symptomatic this time.  We made a shared decision to proceed with greater trochanteric bursa injection under fluoroscopic guidance.  Medications: PDMP was reviewed.  We had a good discussion on treatment options moving forward.    Procedural Interventions: Will plan for bilateral greater trochanteric bursa injections.  A goal of this procedure is to improve the patient's quality of life by decreasing their pain and improving their ability to perform ADLs such as cooking, cleaning, and self-care. A additional goal of this procedure is to prevent opioid initiation and/or  escalation which carries a risk of respiratory depression and death. Future considerations may include trigger point injections, sacroiliac ligamentous complex/joint injections, and diagnostic lumbar medial branch blocks progression radiofrequency nerve ablation if indicated Decision regarding the above listed minor surgery/procedures were discussed with the patient.   Risk factors of the above listed minor surgery/procedures were discussed with the patient. In addition, educational sheets were given or sent via [facility], to the patient, of the above procedures/minor surgery which includes risks factors: such as, but not limited to bleeding, infection, nerve damage, damage to adjacent structures. The goal of this procedure is to prevent opioid initiation and/or escalation which carries a risk of respiratory depression and death. A goal of this procedure is to improve the patient's quality of life by decreasing their pain and improving their ability to perform ADLs such as cooking, cleaning, and self-care.  Physical Therapy: The patient is actively involved in physical therapy and supervised home exercise program  Imaging/Diagnostic Studies: pertinent imaging reviewed and discussed with patient. Independent visualization of the imaging were performed by me.  Follow up: For procedure  Treatment plan fully discussed and agreed upon with the patient. All questions were answered.  Given the patient's complex chronic pain symptoms and medical history, the patient will require continued follow-up in a longitudinal fashion.   Treatment plan fully discussed and agreed upon with the patient. All questions were welcomed and answered. Medical decision making for this patient was moderately complex given the patient's preexisting comorbidities, chronicity of the patient's current pathology, review of relevant records from other healthcare providers, independent interpretation of imaging and/or lab  results if appropriate, discussion regarding risks and benefits of proceeding forward with  or holding off on scheduling a minor procedure if appropriate and designated above, the severity/progression of the pathology discussed, specific medication management if reviewed, discussion regarding financial implications of treatment, and/or the risk of complication and/or morbidity that may exist with or without treatment.  Components of this note were completed via Dragon [employer] One Voice dictation software.  Electronically signed by: Shelly Charity, MD 10/12/2023       [1] Past Medical History: Diagnosis Date  . ADHD (attention deficit hyperactivity disorder)   . Allergic   . Allergic rhinitis   . Allergies   . Anxiety   . Arthritis 2021  . Bipolar 1 disorder    (CMD)   . Breast cyst Age 56  . Depression   . Diabetes mellitus    (CMD)   . Eating disorder   . Endometriosis   . Fibrocystic breast   . GERD (gastroesophageal reflux disease)   . Headache   . HL (hearing loss)   . Infectious viral hepatitis   . Inflammatory bowel disease   . Jaundice   . Myocardial infarction    (CMD)   . Neuromuscular disorder    (CMD)   . Obesity   . OSA (obstructive sleep apnea)   . Peptic ulceration   . Pneumonia   . Pre-diabetes   . Stroke    (CMD)   . Substance abuse (CMD)   . Thyroglossal cyst   . Tinnitus   . Urinary tract infection   . Varicella   . Visual impairment   . Vitamin D  deficiency   [2] Past Surgical History: Procedure Laterality Date  . BREAST CYST EXCISION  Age 64  . BREAST SURGERY  1984   Cyst removal left  . DIAGNOSTIC LAPAROSCOPY     For endometriosis  . NECK SURGERY     For thyroglossal cyst removal  [3] Family History Problem Relation Name Age of Onset  . Hypertension Mother Roselind   . Heart attack Mother Roselind   . Arthritis Mother Roselind 38 - 39       Cocaine abuse, MI x 2 with stents, kidney disease, wheelchair bound.  . Depression Mother Roselind   . Diabetes  Mother Roselind   . Heart disease Mother Roselind 47 - 52       Mi x 2 w stents  . Hyperlipidemia Mother Roselind   . Alcohol  abuse Mother Roselind 62 - 26  . Kidney disease Mother Roselind 65 - 19  . Alcohol  abuse Father Jim bipolar, no meds. Cocaine abuse.   . Drug abuse Father Signe bipolar, no meds. Cocaine abuse.   SABRA Hearing loss Father Signe bipolar, no meds. Cocaine abuse.   . Mental illness Father Signe bipolar, no meds. Cocaine abuse.   . Breast cancer Neg Hx    [4] Current Outpatient Medications  Medication Sig Dispense Refill  . cetirizine (ZyrTEC) 10 mg tablet TAKE 1 TABLET(10 MG) BY MOUTH DAILY 90 tablet 3  . cholecalciferol (VITAMIN D3) 2,000 unit cap/tab Take 2,000 Units by mouth daily.    SABRA conj estrog-medroxyprogest ace (PREMPRO) 0.625-2.5 mg tab tablet Take 1 tablet by mouth daily. 60 tablet 3  . cyclobenzaprine (FLEXERIL) 5 mg tablet Take 2 tablets (10 mg total) by mouth 3 (three) times a day as needed for muscle spasms. 30 tablet 3  . Cymbalta  60 mg capsule Take 60 mg by mouth daily.    . lamoTRIgine  (LaMICtal  XR) 200 mg tr24 Take 200 mg by mouth daily.    SABRA  methylPREDNISolone  (MEDROL  DOSEPAK) 4 mg 6 day dose pack FOLLOW PACKAGE DIRECTIONS    . pantoprazole (PROTONIX) 40 mg EC tablet Take 40 mg by mouth daily as needed. (Patient taking differently: Take 40 mg by mouth every morning before breakfast.)    . pregabalin  (Lyrica ) 225 mg capsule Take 1 capsule (225 mg total) by mouth 2 (two) times a day. Do not exceed more than a total of 450 mg/day 180 capsule 3  . SUMAtriptan (IMITREX) 20 mg/actuation solution Administer 1 spray (20 mg total) into one nostril daily as needed (migraine). Single spray in 1 nostril, if needed, second dose may administer at least 2 hours after the first dose.  Maximum daily dose is 40 mg in 24 hours 6 each 2   No current facility-administered medications for this visit.  *Some images could not be shown.

## 2023-10-08 ENCOUNTER — Ambulatory Visit (HOSPITAL_COMMUNITY): Payer: MEDICAID

## 2023-10-12 NOTE — Telephone Encounter (Addendum)
 Ms. Tullis left a voicemail stating that she needed to r/s her procedure with Dr. Constantino. I called her back to reschedule and left a voicemail for a call back at her convenience.  10/13/23-  Ms. Mazo left a voicemail stating that she needed to r/s her procedure with Dr. Constantino. I called her back to reschedule and left a voicemail for a call back at her convenience.

## 2023-10-14 NOTE — Progress Notes (Signed)
 Physical Therapy Visit - Daily Note   Payor: [place] [employer] / Plan: [place] [employer] / Product Type: Managed Medicaid /   Visit Count: 15  Rehabilitation Precautions/Restrictions:   Precautions/Restrictions Precautions: chrons, fibromyalgia        Referring Diagnosis: M25.551,M25.552 (ICD-10-CM) - Bilateral hip pain  M70.61 (ICD-10-CM) - Trochanteric bursitis of right hip  M70.71 (ICD-10-CM) - Ischial bursitis of right side   SUBJECTIVE Patient Report:   Reports compliance with HEP. Reports that she was able to walk further than she's been able to in a while  Change in Status Since Last Visit: No Pain:  Pain Assessment Pain Assessment: 0-10 Pain Score  : 4 Pain Type: Chronic pain Pain Location: Leg Pain Orientation: Right, Left  OBJECTIVE  General Observation/Objective Measures:    Ambulating independently mild impaired dynamic gait    Interventions:   Therapeutic exercise x 45' Review Hep  Recumbent bike x 6' warm up Bil heel raise x 30 on 4 inch step BUE support Slant board stretch x 2 hold 1' BLE Leg press x 30 60 lbs Single leg leg press x 30 40 lbs each LE Quantum lumbar extension x 30 hold 5 Sciatic nerve glide x 30 hold 5 BLE BUE support on stairs  Bridges with hip abduction with green tband at knees x 30 Hamstring curls x 30 40 lbs Knee extension x 30 40 lbs Glide disc hip abduction x 30 BLE Bue support Glide disc hip extension x 30 BLE Bue support Hamstring stretch on stairs x 2 hold 1' BLE x 2 sets    Education: Yes, as described in interventions   ASSESSMENT   Patient with improvement in prolonged standing/walking and performing functional activities due to improvement in muscular strength/endurance and decreased pain. Patient continues to present with sciatic neural tension in the RLE. Patient with continued impaired core/BLE strength/endurance noted at this time. Patient to continue to benefit  from skilled physical therapy services to address musculoskeletal and functional impairments to facilitate return to prior functional mobility, decrease pain, and improve quality of life.   Therapy Diagnosis:     ICD-10-CM   1. Muscle weakness (generalized)  M62.81   2. Bilateral hip pain  M25.551, M25.552   3. Difficulty in walking  R26.2      Progress Towards Goals:     Goals Addressed             This Visit's Progress   . PT Goal       Short Term Goals (STG) - Time Frame 7/19  STG 1: pt to report compliance with HEP  STG 2: pt to report no more than 3/10 pain with functional activities  5-7/10 pain 8/13 with repeated activity  Long Term Goals (LTG) - Time Frame 9/15  LTG 1: pt to report no more than 20% disability with LEFS to facilitate return to prior functional mobility  45% 8/13  LTG 2: pt to present 5/5 BLE MMT pain free to facilitate return to prior functional mobility  5/5 hip flexion, 4+/5 hip extension/hip abduction  LTG 3: pt to demonstrate 30 sec BSLS on uneven surface to facilitate decreased fall risk Requires BUE support 8/13  LTG 4: pt to return to walking 30 minutes daily for exercise without pain   Progressing 10 minutes, then breaks, and continues 8/13         PLAN Treatment Frequency and Duration:  PT Frequency: Other (comment) PT Duration: Other (comment)  Treatment Plan Details: recommending up to 2 x a week decrease to 1 x a week as needed up to 20 visits, 12/21/2023  Recommended PT Treatment/Interventions: Dry needling (1-2 muscles) (79439); Dry Needling (3+ muscles) (79438); Electrical stimulation-attended (02967); Electrical stimulation-unattended (02985); Gait training (02883); Iontophoresis [phone]); Manual therapy (97140); Mechanical traction (02987); Neuromuscular re-education [phone]); Physical Performance Testing (02249); Therapeutic activity (97530); Therapeutic exercise (97110); Ultrasound (02964); Vasopneumatic compression  (02983)   Recommended Consults:  None currently  Development of Plan of Care:  No change in POC.  Total Treatment Time (Time & Untimed): Total Treatment Time: 45 Total Time in Timed Codes: Time in Timed Codes: 45     Treatment/Procedures Therapeutic Exercises minutes: 45            The patient has been instructed to contact our clinic if any questions or problems should arise.

## 2023-10-16 ENCOUNTER — Other Ambulatory Visit (HOSPITAL_COMMUNITY): Payer: Self-pay | Admitting: Medical

## 2023-10-21 ENCOUNTER — Ambulatory Visit (HOSPITAL_BASED_OUTPATIENT_CLINIC_OR_DEPARTMENT_OTHER): Payer: MEDICAID | Admitting: Medical

## 2023-10-21 ENCOUNTER — Encounter (HOSPITAL_COMMUNITY): Payer: Self-pay | Admitting: Medical

## 2023-10-21 VITALS — BP 123/86 | HR 94 | Ht 66.0 in | Wt 253.0 lb

## 2023-10-21 DIAGNOSIS — F418 Other specified anxiety disorders: Secondary | ICD-10-CM

## 2023-10-21 DIAGNOSIS — R451 Restlessness and agitation: Secondary | ICD-10-CM

## 2023-10-21 DIAGNOSIS — F1211 Cannabis abuse, in remission: Secondary | ICD-10-CM

## 2023-10-21 DIAGNOSIS — F5025 Bulimia nervosa, in remission: Secondary | ICD-10-CM

## 2023-10-21 DIAGNOSIS — G4733 Obstructive sleep apnea (adult) (pediatric): Secondary | ICD-10-CM

## 2023-10-21 DIAGNOSIS — F458 Other somatoform disorders: Secondary | ICD-10-CM

## 2023-10-21 DIAGNOSIS — F1021 Alcohol dependence, in remission: Secondary | ICD-10-CM

## 2023-10-21 DIAGNOSIS — F4312 Post-traumatic stress disorder, chronic: Secondary | ICD-10-CM | POA: Diagnosis not present

## 2023-10-21 DIAGNOSIS — F447 Conversion disorder with mixed symptom presentation: Secondary | ICD-10-CM

## 2023-10-21 DIAGNOSIS — E282 Polycystic ovarian syndrome: Secondary | ICD-10-CM

## 2023-10-21 DIAGNOSIS — F4321 Adjustment disorder with depressed mood: Secondary | ICD-10-CM

## 2023-10-21 DIAGNOSIS — F1321 Sedative, hypnotic or anxiolytic dependence, in remission: Secondary | ICD-10-CM

## 2023-10-21 MED ORDER — PREGABALIN 150 MG PO CAPS: 150.0000 mg | ORAL_CAPSULE | Freq: Three times a day (TID) | ORAL | 2 refills | Status: DC
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
## 2023-10-21 NOTE — Telephone Encounter (Signed)
 Fillerd by other means

## 2023-10-21 NOTE — Telephone Encounter (Signed)
 Contacted patient to reset expectations around [facility] portal messaging. Expressed to patient that due to the high volume of recent messages regarding questions related to her care, would be best to schedule an office visit. Explained that the portal is a great tool for quick updates and clarifications, but it's not designed to replace in-person visits or extended conversations. Expressed to patient that in order to ensure safe and effective care, if she has multiple concerns or needs more detailed guidance, scheduling an office visit is the best way to give her the dedicated time and attention to Dr. Murleen.   Pt verbalized understanding and expressed that she will schedule an office visit throught the [facility] portal app. Pt denied any other questions or concerns, and advised patient to call if she had any further questions or concerns and will see her at her next visit.

## 2023-11-02 NOTE — Telephone Encounter (Signed)
 Pre Op Call made to pt. Left VM reminding them of upcoming procedure appointment at [facility] with Dr. Constantino on 11/12/23 at 10:00AM.  Message included to arrive 15 minutes early, wear loose fitting clothing, to bring a driver, ok to eat a light meal and take medications as normal. If you develop any type of infection, are treated with antibiotics, or develop Covid or Flu symptoms: please contact our office. If taking blood thinners, please call us  to discuss.  Phone number to call [phone] ([phone] if any unforseen circumstances should arise.

## 2023-11-03 NOTE — Telephone Encounter (Signed)
 Copied from CRM #[id number]. Topic: Referral - Referrals/Orders [place] >> Nov 03, 2023  1:11 PM Wyline GRADE wrote: Dane, Bloch is calling other request    Include all details related to the request(s) below: Patient wants to know if provider can add venous study to the US  ABI/WBI referral that is already placed.  Patient has appointment for US  scheduled for 11/09/23. Please advise.     Confirm and type the Best Contact Number below:  Patient/caller contact number:    [phone]         [] Home  [x] Mobile  [] Work [] Other   [] Okay to leave a voicemail   Medication List:  Current Outpatient Medications:  .  cetirizine (ZyrTEC) 10 mg tablet, TAKE 1 TABLET(10 MG) BY MOUTH DAILY, Disp: 90 tablet, Rfl: 3 .  cholecalciferol (VITAMIN D3) 2,000 unit cap/tab, Take 2,000 Units by mouth daily., Disp: , Rfl:  .  conj estrog-medroxyprogest ace (PREMPRO) 0.625-2.5 mg tab tablet, Take 1 tablet by mouth daily., Disp: 60 tablet, Rfl: 3 .  cyclobenzaprine (FLEXERIL) 5 mg tablet, Take 2 tablets (10 mg total) by mouth 3 (three) times a day as needed for muscle spasms., Disp: 30 tablet, Rfl: 3 .  DULoxetine  (CYMBALTA ) 30 mg capsule, Take 30 mg by mouth daily., Disp: , Rfl:  .  EPINEPHrine (EPIPEN) 0.3 mg/0.3 mL injection syringe, Inject 0.3 mL (0.3 mg total) into the thigh as needed for anaphylaxis (for bees and hornets)., Disp: 1 each, Rfl: 3 .  lamoTRIgine  (LaMICtal  XR) 200 mg tr24, Take 200 mg by mouth daily., Disp: , Rfl:  .  pantoprazole (PROTONIX) 40 mg EC tablet, Take 40 mg by mouth daily as needed. (Patient taking differently: Take 40 mg by mouth every morning before breakfast.), Disp: , Rfl:  .  pregabalin  (LYRICA ) 150 mg capsule, Take 150 mg by mouth 3 (three) times a day., Disp: , Rfl:  .  SUMAtriptan (IMITREX) 20 mg/actuation solution, Administer 1 spray (20 mg total) into one nostril daily as needed (migraine). Single spray in 1 nostril, if needed, second dose may administer at least 2 hours  after the first dose.  Maximum daily dose is 40 mg in 24 hours, Disp: 6 each, Rfl: 2     Medication Request/Refills: Pharmacy Information (if applicable)   [x] Not Applicable       []  Pharmacy listed  Send Medication Request to:                                                 [] Pharmacy not listed (added to pharmacy list in Epic) Send Medication Request to:      Listed Pharmacies: [id number] [place],  - [address] AT [facility] OF [address] - PHONE: [phone] - FAX: [phone]

## 2023-11-09 NOTE — Progress Notes (Signed)
 Name: Alicia Porter Date of visit: 11/10/23  Chief Complaint   Chief Complaint  Patient presents with  . abnormal urine    Check for parasites-stool    Subjective  Alicia Porter is a 53 y.o. female who presents today for follow up/discuss health concern.  Patient was last seen by me on 11/03/2023.   History of Present Illness The patient presents for evaluation of suspected parasitic infection.  She has a history of kidney injuries and was previously referred to a nephrologist but did not follow up on this referral. Concerns about kidney function have arisen due to the presence of bilirubin in her urine. A urine sample has been provided for analysis. Recent CMP showed normal kidney function.   She is scheduled for an endoscopy in 12/2023 to manage her Crohn's disease. Blood work ordered on 01/18/2023 has not yet been completed.  She is requesting testing for parasites despite not exhibiting symptoms typically associated with a parasitic infection, such as anal itching or the presence of string-like substances in her stool.       Current Outpatient Medications  Medication Instructions  . cetirizine (ZYRTEC) 10 mg, oral, Daily  . cholecalciferol (VITAMIN D3) 2,000 Units, Daily  . conj estrog-medroxyprogest ace (PREMPRO) 0.625-2.5 mg tab tablet 1 tablet, oral, Every 24 hours  . cyclobenzaprine (FLEXERIL) 10 mg, oral, 3 times daily PRN  . EPINEPHrine (EPIPEN) 0.3 mg, intramuscular, As needed  . lamoTRIgine  (LAMICTAL  XR) 200 mg, Daily  . pantoprazole (PROTONIX) 40 mg, Daily PRN  . SUMAtriptan (IMITREX) 20 mg, One Nostril, Daily PRN, Single spray in 1 nostril, if needed, second dose may administer at least 2 hours after the first dose.  Maximum daily dose is 40 mg in 24 hours   ROS  Other systems reviewed and negative. No other complaints.  Objective   Vitals:   11/10/23 1600  BP: (!) 136/56  Pulse: 96  Temp: 96.1 F (35.6 C)  SpO2: 98%     Physical  Exam: Gen: Well appearing, not ill or toxic looking. Wearing sunglasses during entire encounter  Cardio: Perfusing rhythm Resp: No labored respirations MSK:  No gross abnormality Skin: Warm and dry Neurologic:  No focal deficits Psych: flat mood, linear thought process   Assessment & Plan   Orders Placed This Encounter  Procedures  . Ova and Parasite (O+P) Exam  . Urinalysis with Reflex to Microscopic    No orders of the defined types were placed in this encounter.     1. Abnormal urine (Primary) 2. Unspecified parasitic disease 3. Crohn's disease with complication, unspecified gastrointestinal tract location 4. Inflammatory bowel diseases (IBD)     On chart notes most recent urinalysis performed at [facility] on September 28, 2023 reviewed and was grossly unremarkable for hematuria or infection, did report small bilirubin and 5 ketones. Pt does want to have urine repeated today. Patient also requesting a parasitic workup given her ongoing GI issues, of note patient does follow with gastroenterology in the setting of her inflammatory bowel disease (Crohn's disease) and has upcoming endoscopy.   - Obtain ova and parasite stool test, she was told by her GI doc in [place] to undergo this study - repeat urinalysis  - Further recommendations pending results - Has upcoming Endoscopy colon Scheduled 12/20/23    5. Chagas disease Requesting to be checked for Chagas  - Trypanosoma cruzi Chagas Screen (DONOR TESTING)      Follow up as scheduled and as needed.  patient verbalized  agreement to above plan.       The patient was informed that if lab work was obtained today, I will personally review any abnormal results, and if further evaluation or follow-up is needed, they will be notified directly through their patient portal or via phone (if they do not have a patient portal set up). The patient acknowledged and understands this.  This note was dictated with voice recognition software.  Transcription error is likely and similar sounding words may be inadvertently transcribed incorrectly.     Richerd Ship, MD

## 2023-11-10 ENCOUNTER — Other Ambulatory Visit (HOSPITAL_COMMUNITY): Payer: Self-pay | Admitting: Medical

## 2023-11-10 NOTE — Addendum Note (Signed)
 Addended by: MADISON ARLAND HERO on: 11/10/2023 09:22 PM  Modules accepted: Orders

## 2023-11-10 NOTE — Patient Instructions (Signed)
 Thank you for coming in!  If LABS WERE DRAWN TODAY, PLEASE ALLOW UP TO 7-10 DAYS TO RECEIVE COMMENTS ON LAB RESULTS THROUGH MY-CHART, PHONE OR MAIL.

## 2023-11-11 ENCOUNTER — Encounter: Payer: Self-pay | Admitting: Medical

## 2023-11-11 NOTE — Addendum Note (Signed)
 Addended by: JOSHUA MASSA A on: 11/11/2023 08:39 AM  Modules accepted: Orders

## 2023-11-12 NOTE — Progress Notes (Signed)
  Pain Joint, Bursa, Tendon, Ligament Injection Bursa; Major; Greater Trochanteric; Bilateral; Fluoroscopy  Performed by: Shelly Charity, MD Authorized by: Shelly Charity, MD   Procedure:  Pain Joint, Bursa, Tendon, Ligament Injection  Type:  Alicia Porter Alicia Porter Size:  Major  Location:  Greater Trochanteric  Procedure Laterality:  Bilateral  Procedure Guidance:  Fluoroscopy   [facility]  [facility]  PATIENT NAME: Alicia Porter PATIENT MRN: [id number] DATE OF SURGERY: 11/12/2023  TITLE OF PROCEDURE: Greater trochanteric bursa injection under fluoroscopic guidance  SIDE: Bilateral  PREOPERATIVE DIAGNOSES: Pain, Greater trochanteric bursitis  POSTOPERATIVE DIAGNOSES:  Same  LOCATION: [facility]  SURGEON: Dr. Shelly Charity M.D.   ASSISTANTS: Dr. Bernardino Agreste, DO  ANESTHESIA:  Local  DESCRIPTION OF OPERATIVE PROCEDURE:  Informed consent was obtained. The patient was brought to the Procedure Room and they positioned themselves to their comfort and optimal positioning for procedure.  Time-out was conducted with all members of the care team.  Standard monitoring was performed. Standard prep and drape were performed.   The area overlying the greater trochanteric bursa was marked with a sterile marking pen.  1% lidocaine for superficial anesthesia was used.  Subsequently, a 3.5 inch, 25g spinal needle was advanced to the bursa and retracted slightly after contacting os. After a negative aspiration, a 4ml injectate consisting of 7cc 0.25% Bupivacaineand 40 mg of Depomedrol (Methylprednisolone ) was incrementally injected into the left greater trochanteric bursa, and the needle was then removed. This was then repeated on the opposite side.   The surgical site preparation was washed off of the patient. Band-Aids were applied. The patient did very well and the procedure results were discussed.  Standard discharge instructions were given to the  patient.  The patient knows how to contact the clinic should they have any questions or problems.  Our clinic staff will call the patient for follow-up tomorrow.  Preprocedural pain score was 8/10 Post procedural pain score was 0/10  COMPLICATIONS: None EBL: minimal URINE OUTPUT: not monitored FLUIDS: none  PLAN: Pt to follow-up as scheduled for procedure  ATTESTATION: I was present for the entirety of the procedure and was involved in key portions of the procedure.  I reviewed the fellow's note and updated it as appropriate.   Treatment plan fully discussed and agreed upon with the patient. All questions were answered.  Components of this note were completed via Dragon Medical One Voice dictation software. Electronically signed by: Shelly Charity, MD 11/12/2023 3:10 PM

## 2023-11-15 NOTE — Telephone Encounter (Signed)
 Pre Op Call made to pt. Left VM reminding them of upcoming procedure appointment at [facility] with Dr. Constantino on 11/26/23 at 10:40AM.  Message included to arrive 15 minutes early, wear loose fitting clothing, to bring a driver, ok to eat a light meal and take medications as normal. If you develop any type of infection, are treated with antibiotics, or develop Covid or Flu symptoms: please contact our office. If taking blood thinners, please call us  to discuss.  Phone number to call [phone] ([phone] if any unforseen circumstances should arise.

## 2023-11-18 ENCOUNTER — Other Ambulatory Visit: Payer: Self-pay

## 2023-11-18 ENCOUNTER — Encounter (HOSPITAL_COMMUNITY): Payer: Self-pay | Admitting: Medical

## 2023-11-18 ENCOUNTER — Ambulatory Visit (HOSPITAL_BASED_OUTPATIENT_CLINIC_OR_DEPARTMENT_OTHER): Payer: MEDICAID | Admitting: Medical

## 2023-11-18 VITALS — BP 126/85 | HR 99 | Ht 66.0 in | Wt 249.0 lb

## 2023-11-18 DIAGNOSIS — F458 Other somatoform disorders: Secondary | ICD-10-CM

## 2023-11-18 DIAGNOSIS — F1021 Alcohol dependence, in remission: Secondary | ICD-10-CM | POA: Diagnosis not present

## 2023-11-18 DIAGNOSIS — F1211 Cannabis abuse, in remission: Secondary | ICD-10-CM

## 2023-11-18 DIAGNOSIS — F4321 Adjustment disorder with depressed mood: Secondary | ICD-10-CM

## 2023-11-18 DIAGNOSIS — Z6838 Body mass index (BMI) 38.0-38.9, adult: Secondary | ICD-10-CM

## 2023-11-18 DIAGNOSIS — F1321 Sedative, hypnotic or anxiolytic dependence, in remission: Secondary | ICD-10-CM | POA: Diagnosis not present

## 2023-11-18 DIAGNOSIS — R451 Restlessness and agitation: Secondary | ICD-10-CM

## 2023-11-18 DIAGNOSIS — F4312 Post-traumatic stress disorder, chronic: Secondary | ICD-10-CM

## 2023-11-18 DIAGNOSIS — F5025 Bulimia nervosa, in remission: Secondary | ICD-10-CM

## 2023-11-18 DIAGNOSIS — F447 Conversion disorder with mixed symptom presentation: Secondary | ICD-10-CM

## 2023-11-18 DIAGNOSIS — E66812 Obesity, class 2: Secondary | ICD-10-CM

## 2023-11-18 DIAGNOSIS — F418 Other specified anxiety disorders: Secondary | ICD-10-CM

## 2023-11-18 DIAGNOSIS — G4733 Obstructive sleep apnea (adult) (pediatric): Secondary | ICD-10-CM

## 2023-11-18 MED ORDER — LAMOTRIGINE ER 100 MG PO TB24: 100.0000 mg | ORAL_TABLET | Freq: Every day | ORAL | 2 refills | Status: DC
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
## 2023-11-25 ENCOUNTER — Other Ambulatory Visit (HOSPITAL_COMMUNITY): Payer: Self-pay | Admitting: Medical

## 2023-11-25 MED ORDER — LAMOTRIGINE ER 100 MG PO TB24: 100.0000 mg | ORAL_TABLET | Freq: Every day | ORAL | 2 refills | Status: DC

## 2023-11-25 NOTE — Progress Notes (Signed)
 Pharmacy requested rx

## 2023-11-26 NOTE — Progress Notes (Signed)
  Pain Joint, Bursa, Tendon, Ligament Injection Ligament; Posterior sacroiliac ligament; Bilateral; Fluoroscopy  Performed by: Shelly Charity, MD Authorized by: Shelly Charity, MD   Procedure:  Pain Joint, Bursa, Tendon, Ligament Injection  Type:  Ligament  Specify:  Posterior sacroiliac ligament  Procedure Laterality:  Bilateral  Procedure Guidance:  Fluoroscopy   [facility]  [facility]  PATIENT NAME: Alicia Porter PATIENT MRN: [id number] DATE OF SURGERY: 11/26/2023  TITLE OF PROCEDURE:   1) Posterior Sacroiliac Ligament Injection  2) Fluoroscopic Guidance     SIDE: bilateral  PREOPERATIVE DIAGNOSES: Sacroiliac ligamentous complex pain, iliolumbar ligament pain, chronic musculoskeletal pain  POSTOPERATIVE DIAGNOSES:  Same  LOCATION: [facility]  SURGEON: Shelly Charity MD  ASSISTANTS: Dr. Daved Severs, MD  ANESTHESIA: Local  DESCRIPTION OF OPERATIVE PROCEDURE:  Informed consent was obtained. The patient was brought to the Procedure Room and they positioned themselves to their comfort and optimal positioning for procedure.  Time-out was conducted with all members of the care team.  Standard ASA monitors were placed. Standard prep and drape were performed.  Fluoroscopic views were obtained of the sacroiliac joint. The area overlying the joint was identified with contralateral oblique fluoroscopy and marked with a sterile marking pen. Next, a 25-gauge 3.5 inch spinal needle was then guided into the target zones of posterior sacroiliac ligament under fluoroscopic guidance. Final position was confirmed in multiplanar views. The following solution was then injected into each ligamentous complex: 3mL of a 6mL solution containing  5mL 0.25% bupivacaine and 1 mL of 40mg /mL methylprednisolone  (Depomedrol). The procedure was repeated on the opposite side. The surgical site preparation was washed off of the patient. Band-Aids were  applied. The patient was brought to the recovery area.  The patient did very well and the procedure results were discussed.  Standard discharge instructions were given to the patient.  The patient knows how to contact the clinic should they have any questions or problems.  Our clinic nurse will call the patient for follow-up tomorrow.  Preprocedural pain score was  5/10 Post procedural pain score was 5/10  COMPLICATIONS: None   EBL: minimal  URINE OUTPUT: not monitored  FLUIDS: none  PLAN: Follow-up as scheduled   ATTESTATION: I was present for the entirety of the procedure and was involved in key portions of the procedure.  I reviewed the fellow's note and updated it as appropriate.   Treatment plan fully discussed and agreed upon with the patient. All questions were answered.  Components of this note were completed via Dragon Medical One Voice dictation software. Electronically signed by: Shelly Charity, MD 11/26/2023 1:28 PM

## 2023-11-29 NOTE — Telephone Encounter (Signed)
 Post op call made to patient, no answer.  Left message encouraging patient to call pain services nurse triage line [phone]  if any issues or concerns should arise.   Pt is s/p Posterior Sacroiliac Ligament Injection - bilateral with Dr. Shelly Charity on 11/26/23 @ [facility].

## 2023-11-30 ENCOUNTER — Ambulatory Visit (HOSPITAL_COMMUNITY)
Admission: EM | Admit: 2023-11-30 | Discharge: 2023-12-01 | Disposition: A | Discharge status: Other Acute Inpatient Hospital | Payer: MEDICAID | Attending: Psychiatry | Admitting: Psychiatry

## 2023-11-30 ENCOUNTER — Inpatient Hospital Stay: Admission: RE | Admit: 2023-11-30 | Payer: MEDICAID | Source: Intra-hospital | Admitting: Psychiatry

## 2023-11-30 ENCOUNTER — Encounter (HOSPITAL_COMMUNITY): Payer: Self-pay

## 2023-11-30 DIAGNOSIS — R45851 Suicidal ideations: Secondary | ICD-10-CM | POA: Insufficient documentation

## 2023-11-30 DIAGNOSIS — F333 Major depressive disorder, recurrent, severe with psychotic symptoms: Secondary | ICD-10-CM | POA: Insufficient documentation

## 2023-11-30 LAB — CBC WITH DIFFERENTIAL/PLATELET
Abs Immature Granulocytes: 0.03 K/uL (ref 0.00–0.07)
Basophils Absolute: 0.1 K/uL (ref 0.0–0.1)
Basophils Relative: 1 %
Eosinophils Absolute: 0.1 K/uL (ref 0.0–0.5)
Eosinophils Relative: 1 %
HCT: 47.5 % — ABNORMAL HIGH (ref 36.0–46.0)
Hemoglobin: 15.4 g/dL — ABNORMAL HIGH (ref 12.0–15.0)
Immature Granulocytes: 0 %
Lymphocytes Relative: 26 %
Lymphs Abs: 2.6 K/uL (ref 0.7–4.0)
MCH: 31.8 pg (ref 26.0–34.0)
MCHC: 32.4 g/dL (ref 30.0–36.0)
MCV: 97.9 fL (ref 80.0–100.0)
Monocytes Absolute: 0.5 K/uL (ref 0.1–1.0)
Monocytes Relative: 5 %
Neutro Abs: 6.8 K/uL (ref 1.7–7.7)
Neutrophils Relative %: 67 %
Platelets: 341 K/uL (ref 150–400)
RBC: 4.85 MIL/uL (ref 3.87–5.11)
RDW: 13.4 % (ref 11.5–15.5)
WBC: 10.1 K/uL (ref 4.0–10.5)
nRBC: 0 % (ref 0.0–0.2)

## 2023-11-30 LAB — COMPREHENSIVE METABOLIC PANEL WITH GFR
ALT: 30 U/L (ref 0–44)
AST: 28 U/L (ref 15–41)
Albumin: 4.4 g/dL (ref 3.5–5.0)
Alkaline Phosphatase: 84 U/L (ref 38–126)
Anion gap: 13 (ref 5–15)
BUN: 12 mg/dL (ref 6–20)
CO2: 25 mmol/L (ref 22–32)
Calcium: 10 mg/dL (ref 8.9–10.3)
Chloride: 102 mmol/L (ref 98–111)
Creatinine, Ser: 0.79 mg/dL (ref 0.44–1.00)
GFR, Estimated: >60 mL/min (ref >60)
Glucose, Bld: 99 mg/dL (ref 70–99)
Potassium: 3.7 mmol/L (ref 3.5–5.1)
Sodium: 140 mmol/L (ref 135–145)
Total Bilirubin: 0.8 mg/dL (ref 0.0–1.2)
Total Protein: 7.8 g/dL (ref 6.5–8.1)

## 2023-11-30 LAB — POCT URINE DRUG SCREEN - MANUAL ENTRY (I-SCREEN)
POC Amphetamine UR: NOT DETECTED
POC Buprenorphine (BUP): NOT DETECTED
POC Cocaine UR: NOT DETECTED
POC Marijuana UR: NOT DETECTED
POC Methadone UR: NOT DETECTED
POC Methamphetamine UR: NOT DETECTED
POC Morphine: NOT DETECTED
POC Oxazepam (BZO): NOT DETECTED
POC Oxycodone UR: NOT DETECTED
POC Secobarbital (BAR): NOT DETECTED

## 2023-11-30 LAB — MAGNESIUM: Magnesium: 2.1 mg/dL (ref 1.7–2.4)

## 2023-11-30 LAB — POC URINE PREG, ED: Preg Test, Ur: NEGATIVE

## 2023-11-30 LAB — TSH: TSH: 1.155 u[IU]/mL (ref 0.350–4.500)

## 2023-11-30 LAB — ETHANOL: Alcohol, Ethyl (B): <15 mg/dL (ref <15)

## 2023-11-30 MED ORDER — SULFAMETHOXAZOLE-TRIMETHOPRIM 800-160 MG PO TABS
1.0000 | ORAL_TABLET | Freq: Two times a day (BID) | ORAL | Status: DC
  Filled 2023-11-30: qty 1

## 2023-11-30 MED ORDER — MAGNESIUM HYDROXIDE 400 MG/5ML PO SUSP: 30.0000 mL | Freq: Every day | ORAL | Status: DC | PRN

## 2023-11-30 MED ORDER — DIPHENHYDRAMINE HCL 50 MG/ML IJ SOLN: 50.0000 mg | Freq: Three times a day (TID) | INTRAMUSCULAR | Status: DC | PRN

## 2023-11-30 MED ORDER — TRAZODONE HCL 50 MG PO TABS
50.0000 mg | ORAL_TABLET | Freq: Every evening | ORAL | Status: DC | PRN
  Filled 2023-11-30: qty 1

## 2023-11-30 MED ORDER — HYDROXYZINE HCL 25 MG PO TABS
25.0000 mg | ORAL_TABLET | Freq: Three times a day (TID) | ORAL | Status: DC | PRN
  Filled 2023-11-30: qty 1

## 2023-11-30 MED ORDER — LAMOTRIGINE ER 50 MG PO TB24
100.0000 mg | ORAL_TABLET | Freq: Every day | ORAL | Status: DC
  Administered 2023-11-30: 100 mg via ORAL
  Filled 2023-11-30: qty 2

## 2023-11-30 MED ORDER — LORAZEPAM 2 MG/ML IJ SOLN: 2.0000 mg | Freq: Three times a day (TID) | INTRAMUSCULAR | Status: DC | PRN

## 2023-11-30 MED ORDER — CETIRIZINE HCL 10 MG PO TABS
10.0000 mg | ORAL_TABLET | Freq: Every day | ORAL | Status: DC
  Administered 2023-11-30: 10 mg via ORAL
  Filled 2023-11-30: qty 1

## 2023-11-30 MED ORDER — HALOPERIDOL LACTATE 5 MG/ML IJ SOLN: 10.0000 mg | Freq: Three times a day (TID) | INTRAMUSCULAR | Status: DC | PRN

## 2023-11-30 MED ORDER — ALUM & MAG HYDROXIDE-SIMETH 200-200-20 MG/5ML PO SUSP: 30.0000 mL | ORAL | Status: DC | PRN

## 2023-11-30 MED ORDER — ACETAMINOPHEN 325 MG PO TABS: 650.0000 mg | ORAL_TABLET | Freq: Four times a day (QID) | ORAL | Status: DC | PRN

## 2023-11-30 MED ORDER — HALOPERIDOL LACTATE 5 MG/ML IJ SOLN: 5.0000 mg | Freq: Three times a day (TID) | INTRAMUSCULAR | Status: DC | PRN

## 2023-11-30 MED ORDER — SULFAMETHOXAZOLE-TRIMETHOPRIM 800-160 MG PO TABS: 1.0000 | ORAL_TABLET | Freq: Two times a day (BID) | ORAL | Status: DC

## 2023-11-30 MED ORDER — HALOPERIDOL 5 MG PO TABS: 5.0000 mg | ORAL_TABLET | Freq: Three times a day (TID) | ORAL | Status: DC | PRN

## 2023-11-30 MED ORDER — DIPHENHYDRAMINE HCL 50 MG PO CAPS: 50.0000 mg | ORAL_CAPSULE | Freq: Three times a day (TID) | ORAL | Status: DC | PRN

## 2023-11-30 NOTE — Progress Notes (Signed)
 Pt slammed door shut after conversation with provider. Door was reopened by this tech.

## 2023-11-30 NOTE — ED Provider Notes (Signed)
 [facility] Continuous Assessment Admission H&P  Date: 11/30/23 Patient Name: Alicia Porter MRN: [id number] Chief Complaint: Suicidal Ideation  Diagnoses:  Final diagnoses:  Major depressive disorder, recurrent episode, severe, with psychosis (HCC)    HPI: History of Present illness: Alicia Porter is a 53 y.o. female.  Patient presents voluntarily to [facility] behavioral health with a friend for walk-in assessment.  Per TTS Counselor upon arrival patient endorsed suicidal ideation with a plan.  Patient did not share plan at that time.  This provider attempted to assess patient face-to-face.  Patient seated in observation area upon my approach.  Patient holding hands over both ears and eyes remaining closed during interaction.  Patient states you cannot help me nobody can help me.  I said I did not want to talk to anybody get out!  Patient engaged minimally.  Patient presents with irritable mood, labile affect.  Thought content disorganized.  This Clinical research associate will initiate involuntary commitment petition at this time.  Patient briefly engages with this Clinical research associate.  Patient states I already told them this does not matter.  I need 3 more doses of Bactrim, I had a UTI.  My medicines are Lamictal  100 mg XR nightly and Zyrtec 10 mg nightly I use a CPAP at home you can get the rest from the record.  Patient gives verbal consent to speak with her friend Angeline phone number [phone]. Spoke briefly with Angeline in lobby, Angeline will leave CPAP with [facility] front desk staff.  Reviewed treatment plan patient verbalized understanding.  Patient states where are you sending me?   Total Time spent with patient: 45 minutes  Musculoskeletal  Strength & Muscle Tone: within normal limits Gait & Station: normal Patient leans: N/A  Psychiatric Specialty Exam  Presentation General Appearance:  Appropriate for Environment; Casual  Eye Contact: None  Speech: Normal Rate; Clear and  Coherent  Speech Volume: Normal  Handedness: Right   Mood and Affect  Mood: Irritable; Labile  Affect: Labile   Thought Process  Thought Processes: Disorganized  Descriptions of Associations:Tangential  Orientation:Other (comment) (unable to assess)  Thought Content:Tangential  Diagnosis of Schizophrenia or Schizoaffective disorder in past: -- (uta, pt uncooperative)  Duration of Psychotic Symptoms: No data recorded Hallucinations:Hallucinations: Other (comment) (unable to assess)  Ideas of Reference:-- (unable to assess)  Suicidal Thoughts:Suicidal Thoughts: Yes, Active SI Active Intent and/or Plan: With Plan  Homicidal Thoughts:Homicidal Thoughts: No   Sensorium  Memory: Other (comment) (unable to assess)  Judgment: Impaired  Insight: Lacking   Executive Functions  Concentration: Other (comment) (unable to assess)  Attention Span: Other (comment) (unable to assess)  Recall: Other (comment) (unable to assess)  Fund of Knowledge: Other (comment) (unable to assess)  Language: Good   Psychomotor Activity  Psychomotor Activity: Psychomotor Activity: Normal   Assets  Assets: Social Support   Sleep  Sleep: Sleep: -- (unable to assess)   No data recorded  Physical Exam Vitals and nursing note reviewed.  Constitutional:      Appearance: Normal appearance. She is well-developed.  HENT:     Head: Normocephalic and atraumatic.     Nose: Nose normal.  Cardiovascular:     Rate and Rhythm: Normal rate.  Pulmonary:     Effort: Pulmonary effort is normal.  Musculoskeletal:        General: Normal range of motion.     Cervical back: Normal range of motion.  Skin:    General: Skin is warm and dry.  Neurological:  Mental Status: She is alert and oriented to person, place, and time.  Psychiatric:        Attention and Perception: Attention and perception normal.        Mood and Affect: Mood is anxious. Affect is labile.         Speech: Speech normal.        Behavior: Behavior normal. Behavior is cooperative.        Thought Content: Thought content includes suicidal ideation. Thought content includes suicidal plan.        Cognition and Memory: Cognition normal.    Review of Systems  Constitutional: Negative.   HENT: Negative.    Eyes: Negative.   Respiratory: Negative.    Cardiovascular: Negative.   Gastrointestinal: Negative.   Genitourinary: Negative.   Musculoskeletal: Negative.   Skin: Negative.   Neurological: Negative.   Psychiatric/Behavioral:  Positive for depression and suicidal ideas.     Blood pressure (!) 140/93, pulse 70, temperature 97.8 F (36.6 C), temperature source Oral, resp. rate 16, SpO2 98%. There is no height or weight on file to calculate BMI.  Past Psychiatric History: Major depressive disorder, generalized anxiety disorder, borderline personality disorder, alcohol  dependence and withdrawal, bipolar 1 disorder, agitation  Is the patient at risk to self? Yes  Has the patient been a risk to self in the past 6 months? Yes .    Has the patient been a risk to self within the distant past? Yes   Is the patient a risk to others? No   Has the patient been a risk to others in the past 6 months? No   Has the patient been a risk to others within the distant past? No   Past Medical History: PCOS, tobacco use disorder, obstructive sleep apnea, insomnia, type 2 diabetes mellitus  Family History: None reported  Social History: None reported  Last Labs:  Scanned Document on 11/11/2023  Component Date Value Ref Range Status   Creatinine, POC 09/06/2023 79  mg/dL Final   Abstracted by HIM   Creatinine, POC 09/13/2023 131  mg/dL Final   Abstracte by HIM  Admission on 09/28/2023, Discharged on 09/28/2023  Component Date Value Ref Range Status   WBC 09/28/2023 7.1  4.0 - 10.5 K/uL Final   RBC 09/28/2023 4.23  3.87 - 5.11 MIL/uL Final   Hemoglobin 09/28/2023 13.2  12.0 - 15.0 g/dL Final    HCT 91/80/7974 42.5  36.0 - 46.0 % Final   MCV 09/28/2023 100.5 (H)  80.0 - 100.0 fL Final   MCH 09/28/2023 31.2  26.0 - 34.0 pg Final   MCHC 09/28/2023 31.1  30.0 - 36.0 g/dL Final   RDW 91/80/7974 15.2  11.5 - 15.5 % Final   Platelets 09/28/2023 299  150 - 400 K/uL Final   nRBC 09/28/2023 0.0  0.0 - 0.2 % Final   Neutrophils Relative % 09/28/2023 62  % Final   Neutro Abs 09/28/2023 4.4  1.7 - 7.7 K/uL Final   Lymphocytes Relative 09/28/2023 28  % Final   Lymphs Abs 09/28/2023 2.0  0.7 - 4.0 K/uL Final   Monocytes Relative 09/28/2023 7  % Final   Monocytes Absolute 09/28/2023 0.5  0.1 - 1.0 K/uL Final   Eosinophils Relative 09/28/2023 2  % Final   Eosinophils Absolute 09/28/2023 0.2  0.0 - 0.5 K/uL Final   Basophils Relative 09/28/2023 1  % Final   Basophils Absolute 09/28/2023 0.1  0.0 - 0.1 K/uL Final   Immature Granulocytes 09/28/2023  0  % Final   Abs Immature Granulocytes 09/28/2023 0.02  0.00 - 0.07 K/uL Final   Performed at [facility], [address] [place], [place] [address]   Sodium 09/28/2023 139  135 - 145 mmol/L Final   Potassium 09/28/2023 3.8  3.5 - 5.1 mmol/L Final   Chloride 09/28/2023 100  98 - 111 mmol/L Final   CO2 09/28/2023 28  22 - 32 mmol/L Final   Glucose, Bld 09/28/2023 107 (H)  70 - 99 mg/dL Final   Glucose reference range applies only to samples taken after fasting for at least 8 hours.   BUN 09/28/2023 17  6 - 20 mg/dL Final   Creatinine, Ser 09/28/2023 0.76  0.44 - 1.00 mg/dL Final   Calcium  09/28/2023 9.5  8.9 - 10.3 mg/dL Final   Total Protein 91/80/7974 7.0  6.5 - 8.1 g/dL Final   Albumin 91/80/7974 3.8  3.5 - 5.0 g/dL Final   AST 91/80/7974 28  15 - 41 U/L Final   ALT 09/28/2023 30  0 - 44 U/L Final   Alkaline Phosphatase 09/28/2023 70  38 - 126 U/L Final   Total Bilirubin 09/28/2023 0.5  0.0 - 1.2 mg/dL Final   GFR, Estimated 09/28/2023 >60  >60 mL/min Final   Comment: (NOTE) Calculated using the CKD-EPI Creatinine Equation  (2021)    Anion gap 09/28/2023 11  5 - 15 Final   Performed at [facility], [address] [place], [place] [address]   Color, Urine 09/28/2023 AMBER (A)  YELLOW Final   BIOCHEMICALS MAY BE AFFECTED BY COLOR   APPearance 09/28/2023 HAZY (A)  CLEAR Final   Specific Gravity, Urine 09/28/2023 1.029  1.005 - 1.030 Final   pH 09/28/2023 5.0  5.0 - 8.0 Final   Glucose, UA 09/28/2023 NEGATIVE  NEGATIVE mg/dL Final   Hgb urine dipstick 09/28/2023 NEGATIVE  NEGATIVE Final   Bilirubin Urine 09/28/2023 SMALL (A)  NEGATIVE Final   Ketones, ur 09/28/2023 5 (A)  NEGATIVE mg/dL Final   Protein, ur 91/80/7974 NEGATIVE  NEGATIVE mg/dL Final   Nitrite 91/80/7974 NEGATIVE  NEGATIVE Final   Leukocytes,Ua 09/28/2023 NEGATIVE  NEGATIVE Final   RBC / HPF 09/28/2023 0-5  0 - 5 RBC/hpf Final   WBC, UA 09/28/2023 0-5  0 - 5 WBC/hpf Final   Bacteria, UA 09/28/2023 NONE SEEN  NONE SEEN Final   Squamous Epithelial / HPF 09/28/2023 0-5  0 - 5 /HPF Final   Mucus 09/28/2023 PRESENT   Final   Performed at [facility], [address], [place], [place] [address]    Allergies: Hornet venom, Levofloxacin, Wellbutrin [bupropion hcl], Dog epithelium, and Fluoxetine   Medications:  Facility Ordered Medications  Medication   acetaminophen  (TYLENOL ) tablet 650 mg   alum & mag hydroxide-simeth (MAALOX/MYLANTA) 200-200-20 MG/5ML suspension 30 mL   magnesium  hydroxide (MILK OF MAGNESIA) suspension 30 mL   haloperidol  (HALDOL ) tablet 5 mg   And   diphenhydrAMINE (BENADRYL) capsule 50 mg   haloperidol  lactate (HALDOL ) injection 5 mg   And   diphenhydrAMINE (BENADRYL) injection 50 mg   And   LORazepam  (ATIVAN ) injection 2 mg   haloperidol  lactate (HALDOL ) injection 10 mg   And   diphenhydrAMINE (BENADRYL) injection 50 mg   And   LORazepam  (ATIVAN ) injection 2 mg   hydrOXYzine  (ATARAX ) tablet 25 mg   traZODone  (DESYREL ) tablet 50 mg   PTA Medications  Medication Sig    cetirizine (ZYRTEC) 10 MG tablet  Take 10 mg by mouth daily.   EPINEPHrine 0.3 mg/0.3 mL IJ SOAJ injection Inject 0.3 mg into the muscle.   lamoTRIgine  (LAMICTAL  XR) 100 MG 24 hour tablet Take 1 tablet (100 mg total) by mouth daily. (Patient taking differently: Take 100 mg by mouth at bedtime.)      Medical Decision Making  Patient IVC.  Inpatient psychiatric treatment recommended.  Patient will be admitted to [facility] behavioral health continuous observation while awaiting inpatient psychiatric treatment.  Current medications: -Acetaminophen  650 mg every 6 as needed/mild pain -Maalox 30 mL oral every 4 as needed/digestion -Hydroxyzine  25 mg 3 times daily as needed/anxiety -Magnesium  hydroxide 30 mL daily as needed/mild constipation -Trazodone  50 mg nightly as needed/sleep  Agitation protocol: MILD -Haloperidol  5 mg 3 times daily as needed mild agitation  -Diphenhydramine 50 mg p.o. 3 times daily as needed mild agitation  MODERATE -Haloperidol  5 mg IM 3 times daily as needed/moderate agitation -Diphenhydramine 50 mg IM 3 times daily as needed/moderate agitation -Lorazepam  2 mg IM 3 times daily as needed/moderate agitation  SEVERE -Haloperidol  10 mg IM 3 times daily as needed severe agitation -Diphenhydramine 50 mg IM 3 times daily as needed/severe agitation -Lorazepam  2 mg IM 3 times daily as needed/severe agitation  Prior to admission medications restarted: -Cetirizine 10 mg nightly -lamotrigine  XR 100 mg nightly -Bactrim DS 800-160 mg 1 tablet twice daily x 3 doses       Recommendations  Based on my evaluation the patient does not appear to have an emergency medical condition.  Alicia LITTIE Dawn, FNP 11/30/23  4:09 PM

## 2023-11-30 NOTE — ED Notes (Signed)
 Pt remains verbally abusive towards certain members of staff.  Refused PRN meds.

## 2023-11-30 NOTE — ED Notes (Signed)
 Unable to obtain EKG on patient as patient refused to lay back so that the EKG could be done accurately.

## 2023-11-30 NOTE — ED Notes (Signed)
 Pt A&O x 4, awake & resting in bed.  Pt irritable, verbally aggressive at intervals.  Calm at present.  Refused vital signs.  Respirations even & unlabored, no distress noted.  Pt is IVC, monitoring for safety.
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
## 2023-11-30 NOTE — ED Notes (Signed)
 Pt awake & resting at present, calm at present, no distress noted.  Remains irritable and disrespecting staff.  Remains under IVC.  Monitoring for safety.

## 2023-11-30 NOTE — ED Notes (Signed)
 Unable to obtain EKG as patient refused to sit back while staff was trying to perform EKG. While attempting to obtain EKG patient appeared very upset and using profanity referring to staff and making threats towards other patients. This nurse advised patient that she will be safe on the unit in an attempt to de-escalate, however patient continued to cuss at staff. Following skin check patient was escorted on the unit and provided food. Patient ate and went to sleep.

## 2023-12-01 ENCOUNTER — Other Ambulatory Visit: Payer: Self-pay

## 2023-12-01 ENCOUNTER — Inpatient Hospital Stay (HOSPITAL_COMMUNITY)
Admission: AD | Admit: 2023-12-01 | Discharge: 2023-12-10 | DRG: 885 | Disposition: A | Discharge status: Home / Self Care | Payer: MEDICAID | Source: Intra-hospital

## 2023-12-01 ENCOUNTER — Encounter (HOSPITAL_COMMUNITY): Payer: Self-pay | Admitting: Family

## 2023-12-01 DIAGNOSIS — F29 Unspecified psychosis not due to a substance or known physiological condition: Secondary | ICD-10-CM

## 2023-12-01 DIAGNOSIS — F329 Major depressive disorder, single episode, unspecified: Principal | ICD-10-CM | POA: Diagnosis present

## 2023-12-01 DIAGNOSIS — E119 Type 2 diabetes mellitus without complications: Secondary | ICD-10-CM | POA: Diagnosis present

## 2023-12-01 DIAGNOSIS — E785 Hyperlipidemia, unspecified: Secondary | ICD-10-CM | POA: Diagnosis present

## 2023-12-01 DIAGNOSIS — F419 Anxiety disorder, unspecified: Secondary | ICD-10-CM | POA: Diagnosis present

## 2023-12-01 DIAGNOSIS — F4310 Post-traumatic stress disorder, unspecified: Secondary | ICD-10-CM | POA: Diagnosis present

## 2023-12-01 DIAGNOSIS — G4730 Sleep apnea, unspecified: Secondary | ICD-10-CM | POA: Diagnosis present

## 2023-12-01 DIAGNOSIS — R45851 Suicidal ideations: Secondary | ICD-10-CM | POA: Diagnosis present

## 2023-12-01 DIAGNOSIS — F17210 Nicotine dependence, cigarettes, uncomplicated: Secondary | ICD-10-CM | POA: Diagnosis present

## 2023-12-01 DIAGNOSIS — G4700 Insomnia, unspecified: Secondary | ICD-10-CM | POA: Diagnosis present

## 2023-12-01 DIAGNOSIS — E282 Polycystic ovarian syndrome: Secondary | ICD-10-CM | POA: Diagnosis present

## 2023-12-01 DIAGNOSIS — F312 Bipolar disorder, current episode manic severe with psychotic features: Principal | ICD-10-CM | POA: Diagnosis present

## 2023-12-01 DIAGNOSIS — R4586 Emotional lability: Secondary | ICD-10-CM | POA: Diagnosis present

## 2023-12-01 DIAGNOSIS — R451 Restlessness and agitation: Secondary | ICD-10-CM | POA: Diagnosis present

## 2023-12-01 LAB — PROLACTIN: Prolactin: 9.9 ng/mL (ref 3.6–25.2)

## 2023-12-01 MED ORDER — SULFAMETHOXAZOLE-TRIMETHOPRIM 800-160 MG PO TABS
1.0000 | ORAL_TABLET | Freq: Two times a day (BID) | ORAL | Status: AC
  Filled 2023-12-01: qty 1

## 2023-12-01 MED ORDER — CETIRIZINE HCL 10 MG PO TABS
10.0000 mg | ORAL_TABLET | Freq: Every day | ORAL | Status: DC
  Administered 2023-12-02 – 2023-12-09 (×8): 10 mg via ORAL
  Filled 2023-12-01 (×7): qty 1

## 2023-12-01 MED ORDER — LORAZEPAM 2 MG/ML IJ SOLN: 2.0000 mg | Freq: Three times a day (TID) | INTRAMUSCULAR | Status: DC | PRN

## 2023-12-01 MED ORDER — DIPHENHYDRAMINE HCL 50 MG/ML IJ SOLN: 50.0000 mg | Freq: Three times a day (TID) | INTRAMUSCULAR | Status: DC | PRN

## 2023-12-01 MED ORDER — HALOPERIDOL LACTATE 5 MG/ML IJ SOLN: 10.0000 mg | Freq: Three times a day (TID) | INTRAMUSCULAR | Status: DC | PRN

## 2023-12-01 MED ORDER — HALOPERIDOL LACTATE 5 MG/ML IJ SOLN: 5.0000 mg | Freq: Three times a day (TID) | INTRAMUSCULAR | Status: DC | PRN

## 2023-12-01 MED ORDER — HYDROXYZINE HCL 25 MG PO TABS
25.0000 mg | ORAL_TABLET | Freq: Three times a day (TID) | ORAL | Status: DC | PRN
  Administered 2023-12-07 – 2023-12-09 (×7): 25 mg via ORAL
  Filled 2023-12-01 (×10): qty 1

## 2023-12-01 MED ORDER — TRAZODONE HCL 50 MG PO TABS
50.0000 mg | ORAL_TABLET | Freq: Every evening | ORAL | Status: DC | PRN
  Administered 2023-12-03 – 2023-12-09 (×5): 50 mg via ORAL
  Filled 2023-12-01 (×7): qty 1

## 2023-12-01 MED ORDER — HALOPERIDOL 5 MG PO TABS: 5.0000 mg | ORAL_TABLET | Freq: Three times a day (TID) | ORAL | Status: DC | PRN

## 2023-12-01 MED ORDER — ALUM & MAG HYDROXIDE-SIMETH 200-200-20 MG/5ML PO SUSP: 30.0000 mL | ORAL | Status: DC | PRN

## 2023-12-01 MED ORDER — HALOPERIDOL 5 MG PO TABS
5.0000 mg | ORAL_TABLET | Freq: Three times a day (TID) | ORAL | Status: DC | PRN
  Filled 2023-12-01: qty 1

## 2023-12-01 MED ORDER — DIPHENHYDRAMINE HCL 25 MG PO CAPS: 50.0000 mg | ORAL_CAPSULE | Freq: Three times a day (TID) | ORAL | Status: DC | PRN

## 2023-12-01 MED ORDER — MAGNESIUM HYDROXIDE 400 MG/5ML PO SUSP: 30.0000 mL | Freq: Every day | ORAL | Status: DC | PRN

## 2023-12-01 MED ORDER — DIPHENHYDRAMINE HCL 50 MG PO CAPS: 50.0000 mg | ORAL_CAPSULE | Freq: Three times a day (TID) | ORAL | Status: DC | PRN

## 2023-12-01 MED ORDER — DIPHENHYDRAMINE HCL 50 MG/ML IJ SOLN
50.0000 mg | Freq: Three times a day (TID) | INTRAMUSCULAR | Status: DC | PRN
  Filled 2023-12-01: qty 1

## 2023-12-01 MED ORDER — DIPHENHYDRAMINE HCL 25 MG PO CAPS
50.0000 mg | ORAL_CAPSULE | Freq: Three times a day (TID) | ORAL | Status: DC | PRN
  Filled 2023-12-01: qty 2

## 2023-12-01 MED ORDER — ACETAMINOPHEN 325 MG PO TABS
650.0000 mg | ORAL_TABLET | Freq: Four times a day (QID) | ORAL | Status: DC | PRN
  Administered 2023-12-04 – 2023-12-10 (×9): 650 mg via ORAL
  Filled 2023-12-01 (×9): qty 2

## 2023-12-01 MED ORDER — LAMOTRIGINE ER 50 MG PO TB24
100.0000 mg | ORAL_TABLET | Freq: Every day | ORAL | Status: DC
  Administered 2023-12-01 – 2023-12-03 (×3): 100 mg via ORAL
  Filled 2023-12-01 (×4): qty 2

## 2023-12-01 NOTE — Group Note (Signed)
 Date:  12/01/2023 Time:  3:55 PM  Group Topic/Focus: Spiritual Wellness   Pt did not attend spiritual wellness group  Alicia Porter 12/01/2023, 3:55 PM

## 2023-12-01 NOTE — Discharge Instructions (Signed)
 Please transfer patient to the [facility] for treatment and stabilization of mental status.

## 2023-12-01 NOTE — ED Notes (Signed)
 [facility] here to take pt to [facility]. All paperwork and belongings given to [facility]

## 2023-12-01 NOTE — ED Notes (Signed)
Called [facility] for transport. 

## 2023-12-01 NOTE — Group Note (Signed)
 Date:  12/01/2023 Time:  5:38 PM  Group Topic/Focus:  Coping With Mental Health Crisis:   The purpose of this group is to help patients identify strategies for coping with mental health crisis.  Group discusses possible causes of crisis and ways to manage them effectively. Developing a Wellness Toolbox:   The focus of this group is to help patients develop a wellness toolbox with skills and strategies to promote recovery upon discharge.    Participation Level:  Did Not Attend  Participation Quality:    Affect:    Cognitive:    Insight:   Engagement in Group:    Modes of Intervention:    Additional Comments:    Eleanor JAYSON Metro 12/01/2023, 5:38 PM

## 2023-12-01 NOTE — Plan of Care (Signed)
  Problem: Safety: Goal: Periods of time without injury will increase Outcome: Progressing   Problem: Coping: Goal: Ability to identify and develop effective coping behavior will improve Outcome: Not Progressing

## 2023-12-01 NOTE — ED Notes (Signed)
 [facility] requested to [facility] after 8am.  Pt is IVC.

## 2023-12-01 NOTE — ED Notes (Signed)
 Pt sleeping at present, no distress noted, respirations even & unlabored.  Monitoring for safety. ?

## 2023-12-01 NOTE — Progress Notes (Signed)
 Patient refused scheduled Bactrim this evening stated that is the wrong one. Patient irritable and hostile.

## 2023-12-01 NOTE — ED Notes (Signed)
 Pt sleeping at this time. Rise and fall of chest noted. Pt in NAD at this time. Will continue to monitor.

## 2023-12-01 NOTE — BHH Group Notes (Signed)
Patient did not attend the Wrap-up group. 

## 2023-12-01 NOTE — Tx Team (Signed)
 Initial Treatment Plan 12/01/2023 3:16 PM Alicia Porter [id number]    PATIENT STRESSORS: Health problems   Medication change or noncompliance     PATIENT STRENGTHS: Capable of independent living  Communication skills  Supportive family/friends    PATIENT IDENTIFIED PROBLEMS: Health Concerns (Recent UTI, Sleep Apnea, diabetes) I had UTI I need 3 more doses of Bactrim.    Risk for self harm I'm suicidal, I don't want to talk to you about it.    Alterations in mood (agitation, Irritability)    Medication change (UTI meds)         DISCHARGE CRITERIA:  Improved stabilization in mood, thinking, and/or behavior Verbal commitment to aftercare and medication compliance  PRELIMINARY DISCHARGE PLAN: Outpatient therapy Return to previous living arrangement Return to previous work or school arrangements  PATIENT/FAMILY INVOLVEMENT: This treatment plan has been presented to and reviewed with the patient, Alicia Porter. The patient have been given the opportunity to ask questions and make suggestions.  Priyah Schmuck, RN 12/01/2023, 3:16 PM

## 2023-12-01 NOTE — Progress Notes (Signed)
 Pt admitted to [facility] under IVC status from [facility] where she presented initially with her friend for SI with plan but will not elaborate on it. Observed to be very irritable with avertive eye contact, labile mood and unwilling to verbalized information to complete assessment, I don't know. I can't talk about that and uncooperative with care. Verbally abusive towards staff at intervals, Don't fucking ask me about that, I can't tell you that. I'm not signing no damn papers anyway, how about that and walked to search room door. Required multiple prompts to comply with skin assessment. Tattoos noted to her body, dry callus to feet with an ortho boot to right foot. Pt refused to give information on events leading to boot placement. Ambulatory to unit with steady gait. Unit orientation done, routines discussed and care plan reviewed with pt. Safety checks initiated at Q 15 minutes intervals. Pt continues to need continued redirections related to being verbally abusive towards staff on hall. Support, encouragement and reassurance offered to pt.

## 2023-12-01 NOTE — ED Notes (Signed)
 Pt A&O irritable, refusing VS, refusing to answer assessment questions. Will continue to monitor.

## 2023-12-02 ENCOUNTER — Ambulatory Visit (HOSPITAL_COMMUNITY): Payer: MEDICAID | Admitting: Medical

## 2023-12-02 DIAGNOSIS — F4310 Post-traumatic stress disorder, unspecified: Secondary | ICD-10-CM

## 2023-12-02 DIAGNOSIS — R45851 Suicidal ideations: Secondary | ICD-10-CM

## 2023-12-02 DIAGNOSIS — F29 Unspecified psychosis not due to a substance or known physiological condition: Secondary | ICD-10-CM

## 2023-12-02 DIAGNOSIS — F312 Bipolar disorder, current episode manic severe with psychotic features: Principal | ICD-10-CM

## 2023-12-02 MED ORDER — CHLORPROMAZINE HCL 25 MG/ML IJ SOLN
50.0000 mg | Freq: Once | INTRAMUSCULAR | Status: AC
  Administered 2023-12-02: 50 mg via INTRAMUSCULAR

## 2023-12-02 MED ORDER — HALOPERIDOL 5 MG PO TABS
5.0000 mg | ORAL_TABLET | Freq: Two times a day (BID) | ORAL | Status: DC
  Administered 2023-12-03 – 2023-12-04 (×2): 5 mg via ORAL
  Filled 2023-12-02 (×4): qty 1

## 2023-12-02 MED ORDER — CHLORPROMAZINE HCL 50 MG/2ML IJ SOLN
50.0000 mg | Freq: Two times a day (BID) | INTRAMUSCULAR | Status: DC
  Administered 2023-12-03: 50 mg via INTRAMUSCULAR
  Filled 2023-12-02 (×6): qty 2

## 2023-12-02 MED ORDER — DIPHENHYDRAMINE HCL 50 MG/ML IJ SOLN
50.0000 mg | INTRAMUSCULAR | Status: DC
  Administered 2023-12-03: 50 mg via INTRAMUSCULAR

## 2023-12-02 NOTE — Plan of Care (Addendum)
 Discussed case with colleague Dr Raliegh and agrees that her agitated behavior is likely in context of acute psychosis. The patient's has rapid escalation of mood and refuses interventions regarding treatment.  Thought content is notable for bizarre and paranoid thought content. The patient makes frequent and violent statements of HI toward staff. Thus, behavior poses imminent threat towards others and will require FMP for stabilization if she refuses oral medications.  Forced Medication Protocol to be initiated to day 12/02/2023 -Haldol  5mg  PO BID is scheduled. If patient refuses oral Haldol , then administer Chlorpromazine 50mg  IM with Diphenhydramine 50mg  IM.
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
## 2023-12-02 NOTE — Group Note (Signed)
 Date:  12/02/2023 Time:  8:49 PM  Group Topic/Focus:  Wrap-Up Group:   The focus of this group is to help patients review their daily goal of treatment and discuss progress on daily workbooks.    Participation Level:  Did Not Attend   Alicia Porter 12/02/2023, 8:49 PM

## 2023-12-02 NOTE — BHH Suicide Risk Assessment (Signed)
 [facility] Admission Suicide Risk Assessment   Nursing information obtained from:  Patient Demographic factors:  Caucasian, Unemployed Current Mental Status:  Suicidal ideation indicated by patient, Self-harm thoughts Loss Factors:  Decrease in vocational status Historical Factors:  Impulsivity Risk Reduction Factors:  Positive social support  Total Time spent with patient: 45 min Principal Problem: Bipolar 1 disorder, severe, current or most recent episode manic, with psychotic features (HCC) Diagnosis:  Principal Problem:   Bipolar 1 disorder, severe, current or most recent episode manic, with psychotic features (HCC) Active Problems:   Agitation   PTSD (post-traumatic stress disorder)   Psychosis (HCC)   Suicidal thoughts   Subjective Data:  Alicia Porter is a 53 y.o. female  with a past psychiatric history of alcohol  use disorder and depression, most recently hospitalized in 2018 for suicidal thoughts.  Collateral information reports a more recent diagnosis of bipolar disorder.  It appears that she completed the [facility] IOP program for alcohol  use issues about 2 months ago.  On the present occasion, she presented to the [facility] behavioral health urgent care reporting suicidal thoughts, accompanied by her longtime friend.  After her initial report of suicidal thoughts, she abruptly refused to speak to anyone.  She is currently admitted to the [facility] behavioral hospital on an involuntary basis.    Continued Clinical Symptoms:    The Alcohol  Use Disorders Identification Test, Guidelines for Use in Primary Care, Second Edition.  World Science writer [place]a). Score between 0-7:  no or low risk or alcohol  related problems. Score between 8-15:  moderate risk of alcohol  related problems. Score between 16-19:  high risk of alcohol  related problems. Score 20 or above:  warrants further diagnostic evaluation for alcohol  dependence and treatment.   CLINICAL FACTORS:   Currently  Psychotic  Psychiatric Specialty Exam: Physical Exam Constitutional:      Appearance: the patient is not toxic-appearing.  Pulmonary:     Effort: Pulmonary effort is normal.  Neurological:     General: No focal deficit present.     Mental Status: the patient is alert and oriented to person, place, and time.   Review of Systems  Respiratory:  Negative for shortness of breath.   Cardiovascular:  Negative for chest pain.  Gastrointestinal:  Negative for abdominal pain, constipation, diarrhea, nausea and vomiting.  Neurological:  Negative for headaches.      BP 111/75 (BP Location: Left Arm)   Pulse 86   Resp 20   Wt 107.8 kg   SpO2 98%   BMI 38.35 kg/m   General Appearance: Fairly Groomed  Eye Contact:  Good  Speech:  Clear and Coherent  Volume:  Normal  Mood:  get out  Affect:  Congruent, angry  Thought Process:  Coherent  Orientation:  Full (Time, Place, and Person)  Thought Content: Logical   Suicidal Thoughts:  No  Homicidal Thoughts:  No  Memory:  Immediate;   Good  Judgement:  poor  Insight:  poor  Psychomotor Activity:  Normal  Concentration:  Concentration: Good  Recall:  Good  Fund of Knowledge: Good  Language: Good  Akathisia:  No  Handed:  not assessed  AIMS (if indicated): not done  Assets:  Communication Skills Desire for Improvement Financial Resources/Insurance Housing Leisure Time Physical Health  ADL's:  Intact  Cognition: WNL  Sleep:  Fair      COGNITIVE FEATURES THAT CONTRIBUTE TO RISK:  Loss of executive function    SUICIDE RISK:  Moderate: Frequent suicidal ideation with  limited intensity, and duration, some specificity in terms of plans, no associated intent, good self-control, limited dysphoria/symptomatology, some risk factors present, and identifiable protective factors, including available and accessible social support.  PLAN OF CARE: See H and P  I certify that inpatient services furnished can reasonably be expected to  improve the patient's condition.   Karleen Kaufmann, MD PGY-4

## 2023-12-02 NOTE — Group Note (Signed)
 Date:  12/02/2023 Time:  11:58 AM  Group Topic/Focus:  Nutrition Group    Participation Level:  Active  Participation Quality:  Appropriate  Affect:  Appropriate  Cognitive:  Appropriate  Insight: Appropriate  Engagement in Group:  Improving  Modes of Intervention:  Discussion and Education  Additional Comments:  Pt attended Nutrition Group  Evelise Reine E Deondrick Searls 12/02/2023, 11:58 AM

## 2023-12-02 NOTE — Plan of Care (Signed)
   Problem: Education: Goal: Emotional status will improve Outcome: Not Progressing Goal: Mental status will improve Outcome: Not Progressing

## 2023-12-02 NOTE — Progress Notes (Signed)
 Patient observed standing in her room upon initial approach, When introducing myself, and asking for pt's name, pt held up her hand gesturing 'to stop'. Pt observed later pacing her room, continues to refuse to answer questions.

## 2023-12-02 NOTE — Progress Notes (Signed)
(  Sleep Hours) - 8.75 (Any PRNs that were needed, meds refused, or side effects to meds)- NONE  (Any disturbances and when (visitation, over night)- Irritable  (Concerns raised by the patient)-  None (SI/HI/AVH)- Denies

## 2023-12-02 NOTE — Progress Notes (Signed)
 Patient transferred to [address] without incident

## 2023-12-02 NOTE — Plan of Care (Addendum)
 Second Opinion For Non Emergent Medications Against Objection  Reason for the Medication: The patient, without the benefit of the specific treatment measure, is incapable of participating in any available treatment plan that will give the patient a realistic opportunity of improving the patient's condition.   Consideration of Side Effects: Consideration of the side effects related to the medication plan has been given.   Rationale for Medication Administration: Patient is verbally aggressive towards staff and posses a threat towards staff and other patients.   -----   At the present time, patient has no capacity to understand the need for treatment, refusing to participate in meaningful interview, and refusing psychotropic medication, because of poor insight and judgment.  Patient continuously swearing and refusing to answer any questions.  She was verbally abusive towards staff at times and then would sit there and stare at other times. She refuses to consider taking medication, recommended by the psychiatrist.  Diagnosis: Acute Psychosis   Treatment plan/Opinion: -patient is mentally ill, as needs hospitalization -patient continues to display active symptoms of disorganized thought, irritability/aggression -patient lacks the capacity to consent to treatment with medication- Haldol   -patient is unable to rationally discuss medication options, risks versus benefit due to their symptoms. -treatment with medication and medication changes would be in their best interest -psychotropics are effective treatment for symptoms of psychosis   Based on my evaluation, the following medications are recommend and indicated for treatment of the patient's psychiatric diagnosis and symptoms:  Haldol  or Thorazine    Marolyn Rosser DO

## 2023-12-02 NOTE — Plan of Care (Signed)
   Problem: Education: Goal: Knowledge of [facility] Education information/materials will improve Outcome: Progressing   Problem: Coping: Goal: Ability to verbalize frustrations and anger appropriately will improve Outcome: Progressing   Problem: Safety: Goal: Periods of time without injury will increase Outcome: Progressing

## 2023-12-02 NOTE — Group Note (Signed)
 LCSW Group Therapy Note   Group Date: 12/02/2023 Start Time: 1100 End Time: 1200   Participation:  did not attend   Type of Therapy:  Group Therapy  Topic:  Finding Balance: Using Wise Mind for Thoughtful Decisions  Objective:  To help participants understand and apply the concept of [place]ie Mind to make balanced, thoughtful decisions by integrating emotion and logic.  Goals: Learn the differences between Emotional Mind, Reasonable Mind, and [employer]. Recognize personal signs of Emotional and Reasonable Mind. Practice using [employer] in real-life scenarios.  Summary:  This class focused on Wise Mind--DBT's concept of balancing Emotional Mind and Reasonable Mind. We identified when we're in each state and practiced using Wise Mind to respond thoughtfully in real-life situations. By combining emotion and logic, participants can improve decision-making, manage challenges, and enhance relationships.  Therapeutic Modalities: Elements of Dialectical Behavior Therapy (DBT):  Mindfulness (noticing thoughts and emotions without judgment), Emotion Regulation (understanding and managing emotional responses), Distress Tolerance (coping with difficult situations without making them worse), Wise Mind (integrating emotion and reason for balanced decision-making) Elements of Cognitive Behavioral Therapy (CBT):  Identifying automatic thoughts, Challenging cognitive distortions, Using logic to reframe unhelpful thinking patterns   Catherene MALVA Dynes, LCSWA 12/02/2023  12:17 PM

## 2023-12-02 NOTE — Group Note (Signed)
 Date:  12/02/2023 Time:  11:08 AM  Group Topic/Focus:  Building Self Esteem:   The Focus of this group is helping patients become aware of the effects of self-esteem on their lives, the things they and others do that enhance or undermine their self-esteem, seeing the relationship between their level of self-esteem and the choices they make and learning ways to enhance self-esteem. Managing Feelings:   The focus of this group is to identify what feelings patients have difficulty handling and develop a plan to handle them in a healthier way upon discharge.    Participation Level:  Did Not Attend  Participation Quality:  n/a  Affect:  n/a  Cognitive:  n/a  Insight: None  Engagement in Group:  n/a  Modes of Intervention:  n/a  Additional Comments:  pt did not attend  Kenedy Haisley E Imane Burrough 12/02/2023, 11:08 AM

## 2023-12-02 NOTE — BHH Counselor (Signed)
 Adult Comprehensive Assessment  Patient ID: Alicia Porter, female   DOB: [birth year], 53 y.o.   MRN: [id number]  1st attempt on PSA. Pt seen pacing in her room and stated No, I will not be speaking with you at all today. Will make another attempt tomorrow.     Jenkins LULLA Primer. 12/02/2023

## 2023-12-02 NOTE — Group Note (Signed)
 Occupational Therapy Group Note  Group Topic: Sleep Hygiene  Group Date: 12/02/2023 Start Time: 1533 End Time: 1602 Facilitators: Dot [place] MATSU, OT   Group Description: Group encouraged increased participation and engagement through topic focused on sleep hygiene. Patients reflected on the quality of sleep they typically receive and identified areas that need improvement. Group was given background information on sleep and sleep hygiene, including common sleep disorders. Group members also received information on how to improve one's sleep and introduced a sleep diary as a tool that can be utilized to track sleep quality over a length of time. Group session ended with patients identifying one or more strategies they could utilize or implement into their sleep routine in order to improve overall sleep quality.        Therapeutic Goal(s):  Identify one or more strategies to improve overall sleep hygiene  Identify one or more areas of sleep that are negatively impacted (sleep too much, too little, etc)     Participation Level: Engaged   Participation Quality: Independent   Behavior: Appropriate   Speech/Thought Process: Relevant   Affect/Mood: Appropriate   Insight: Fair   Judgement: Fair      Modes of Intervention: Education  Patient Response to Interventions:  Attentive   Plan: Continue to engage patient in OT groups 2 - 3x/week.  12/02/2023  [place] MATSU Dot, OT  Jordie Skalsky, OT

## 2023-12-03 ENCOUNTER — Encounter (HOSPITAL_COMMUNITY): Payer: Self-pay

## 2023-12-03 NOTE — Group Note (Signed)
 Date:  12/03/2023 Time:  8:56 PM  Group Topic/Focus:  Wrap-Up Group:   The focus of this group is to help patients review their daily goal of treatment and discuss progress on daily workbooks.    Participation Level:  Did Not Attend  Alicia Porter 12/03/2023, 8:56 PM

## 2023-12-03 NOTE — Plan of Care (Signed)
   Problem: Education: Goal: Emotional status will improve Outcome: Progressing Goal: Mental status will improve Outcome: Progressing Goal: Verbalization of understanding the information provided will improve Outcome: Progressing   Problem: Activity: Goal: Interest or engagement in activities will improve Outcome: Progressing

## 2023-12-03 NOTE — Progress Notes (Signed)
 Psychiatric Admission Assessment Adult  Patient Identification: Alicia Porter MRN:  [id number] Date of Evaluation:  12/03/2023 Chief Complaint:  suicidal thoughts Principal Diagnosis: Bipolar 1 disorder, severe, current or most recent episode manic, with psychotic features (HCC) Diagnosis:  Principal Problem:   Bipolar 1 disorder, severe, current or most recent episode manic, with psychotic features (HCC) Active Problems:   Agitation   PTSD (post-traumatic stress disorder)   Psychosis (HCC)   Suicidal thoughts   CC: suicidal thoughts with a plan  Alicia Porter is a 53 y.o. female  with a past psychiatric history of alcohol  use disorder and depression, most recently hospitalized in 2018 for suicidal thoughts.  Collateral information reports a more recent diagnosis of bipolar disorder.  It appears that she completed the [facility] program for alcohol  use issues about 2 months ago.  On the present occasion, she presented to the [facility] behavioral health urgent care reporting suicidal thoughts, accompanied by her longtime friend.  After her initial report of suicidal thoughts, she abruptly refused to speak to anyone.  She is currently admitted to the [facility] behavioral hospital on an involuntary basis.  Last 24 Hours:  Pt required forced chlorpromazine yesterday at 1512 due to psychosis, agitation, and refusal to take oral medications. Refused to speak to SW yesterday. Slept 10 hours last night without PRN medications. Required forced medications again this morning due to refusal to interact or take medications. Refused to speak to SW today. Successfully attended occupational therapy group today.  Today's Interview:  On interview today, Alicia Porter is oriented to herself, the month, and the year. When asked her location she said a million different places. She does not recall the forced medication doses she was given yesterday. She also states she doesn't feel safe here but refuses to  elaborate when asked. Refused to participate in interview after this point, repeatedly stating that she is finished talking to us . The interview had to be ended due to patient agitation.   [place] Scale:  Flowsheet Row Ad[facility] (Current) [facility] from 11/30/2023 in [facility] ED from 09/28/2023 in [facility] Department at [facility]  C-SSRS RISK CATEGORY Moderate Risk Error: Question 6 not populated No Risk     Lab Results:  No results found for this or any previous visit (from the past 48 hours).   Blood Alcohol  level:  Lab Results  Component Value Date   [facility] <15 11/30/2023   ETH <5 10/08/2016    Metabolic Disorder Labs:  See assessment and plan.    Psychiatric Specialty Exam:  Presentation  General Appearance:  Appropriate for Environment; Casual  Eye Contact: None  Speech: Normal Rate; Clear and Coherent  Speech Volume: Loud   Mood and Affect  Mood: Irritable; Labile  Affect: Labile   Thought Process  Thought Processes: Disorganized  Descriptions of Associations: Tangential  Orientation: Other (comment) (unable to assess)  Thought Content: Tangential  Hallucinations: Unable to assess Ideas of Reference: -- (unable to assess)  Suicidal Thoughts: Patient does not answer Homicidal Thoughts: Unable to assess  Sensorium  Memory: Other (comment) (unable to assess)  Judgment: Impaired  Insight: Lacking   Executive Functions  Concentration: Other (comment) (unable to assess)  Attention Span: Other (comment) (unable to assess)  Recall: Other (comment) (unable to assess)  Fund of Knowledge: Other (comment) (unable to assess)  Language: Good   Psychomotor Activity  Psychomotor Activity:No data recorded  Assets  Assets: Social Support   Sleep  Sleep: Estimated Sleeping Duration (Last 24 Hours): 8.50-9.75  hours  Physical Exam: Physical Exam Constitutional:      Appearance: She is obese.  Pulmonary:     Effort: Pulmonary effort is normal.  Neurological:     Mental Status: She is alert.      Comments: Oriented to self, month, year. Not oriented to president, date, location.  Psychiatric:        Mood and Affect: Affect is labile and angry.        Behavior: Behavior is agitated and aggressive.        Thought Content: Thought content is paranoid.        Judgment: Judgment is impulsive.    ROS Blood pressure 111/75, pulse 86, resp. rate 20, weight 107.8 kg, SpO2 98%. Body mass index is 38.35 kg/m.   Treatment Plan Summary: Daily contact with patient to assess and evaluate symptoms and progress in treatment   ASSESSMENT & PLAN  ASSESSMENT: Based on patient assessment and collateral information, patient appears psychotic with underlying bipolar manic symptoms.  The fact that the patient presented for care reporting suicidal thoughts indicates that the episode may be mixed in nature.  Today, patient is more oriented than previous interviews and some questioning. She is showing an improvement in psychosis and agitation but is still not returned to baseline and largely refuses to interact with staff. Will continue to attempt to treat with oral haldol , agitation protocol and forced medication orders are placed.  Diagnoses / Active Problems:  Principal Problem:   Bipolar 1 disorder, severe, current or most recent episode manic, with psychotic features (HCC) Active Problems:   Agitation   PTSD (post-traumatic stress disorder)   Psychosis (HCC)   Suicidal thoughts    PLAN: Safety and Monitoring:             -- Involuntary admission to inpatient psychiatric unit for safety, stabilization and treatment             -- Daily contact with patient to assess and evaluate symptoms and progress in treatment             -- Patient's case to be discussed in multi-disciplinary team meeting              -- Observation Level : q15 minute checks             -- Vital signs:  q12 hours             -- Precautions: suicide, elopement, and assault   2. Psychiatric Treatment:  -- Haldol  5 mg p.o. twice daily or Thorazine 50 mg IM with Diphenhydramine 50mg  IM twice daily - Patient is on forced medication protocol, see prior documentation - Will discontinue Lamictal , no benefit  -- Continue trazodone  50 mg once nightly as needed for insomnia  -- Continue hydroxyzine  25 mg 3 times daily as needed for anxiety  -Ordering EKG, A1C and Lipid panel for antipsychotic monitoring   --  The risks/benefits/side-effects/alternatives to this medication were discussed in detail with the patient and time was given for questions. The patient consents to medication trial.              -- Metabolic profile and EKG monitoring obtained while on an atypical antipsychotic. See #4 below for values.              -- Encouraged patient to participate in unit milieu and in scheduled group therapies              --  Short Term Goals: Ability to identify changes in lifestyle to reduce recurrence of condition will improve, Ability to verbalize feelings will improve, Ability to disclose and discuss suicidal ideas, Ability to demonstrate self-control will improve, Ability to identify and develop effective coping behaviors will improve, Ability to maintain clinical measurements within normal limits will improve, Compliance with prescribed medications will improve, and Ability to identify triggers associated with substance abuse/mental health issues will improve             -- Long Term Goals: Improvement in symptoms so as ready for discharge                3. Medical Issues Being Addressed:              -- A1c last 6.1, patient does have a history of diabetes - UDS negative, BAL negative, CMP/CBC unremarkable    -- Continue PRN's: Tylenol , Maalox, Milk of Magnesia   4. Routine and other pertinent labs reviewed: EKG  monitoring: QTc: Not obtained.  Last EKG from 2018 within normal limits.  BMI: Body mass index is 38.35 kg/m. Prolactin: Lab Results  Component Value Date   PROLACTIN 9.9 11/30/2023   PROLACTIN 42.8 (H) 10/12/2016    Lipid Panel: Lab Results  Component Value Date   CHOL 213 (H) 01/25/2018   TRIG 124 01/25/2018   HDL 38 (L) 01/25/2018   CHOLHDL 5.6 (H) 01/25/2018   VLDL 27 10/12/2016   LDLCALC 150 (H) 01/25/2018   LDLCALC 96 10/12/2016    HbgA1c: Hgb A1c MFr Bld (%)  Date Value  01/25/2018 5.8 (H)    TSH: TSH (uIU/mL)  Date Value  11/30/2023 1.155  01/25/2018 2.040     5. Discharge Planning:              -- Social work and case management to assist with discharge planning and identification of hospital follow-up needs prior to discharge             -- Estimated LOS: 5-7 days              -- Discharge Concerns: Need to establish a safety plan; Medication compliance and effectiveness             -- Discharge Goals: Return home with outpatient referrals for mental health follow-up including medication management/psychotherapy       Tinnie Sierra, MS4   Lamar Slain, DO

## 2023-12-03 NOTE — Group Note (Signed)
 Recreation Therapy Group Note   Group Topic:Leisure Education  Group Date: 12/03/2023 Start Time: 1037 End Time: 1055 Facilitators: Mamye Bolds-McCall, LRT,CTRS Location: 500 Hall Dayroom   Group Topic: Leisure Education  Goal Area(s) Addresses:  Patient will identify positive leisure and recreation activities.  Patient will identify one positive benefit of participation in leisure activities.   Behavioral Response:   Intervention: Group Game  Activity: Lyrically Correct. Patients were divided in to 2 groups for game play. LRT used a deck of cards with various questions pertaining to the lyrics of songs. Some of the answers were given in multiple choice form while others were to be guesses. The team with the most right responses wins the game.    Education:  Teacher, English as a foreign language, Special educational needs teacher, Discharge Planning  Education Outcome: Acknowledges education/In group clarification offered/Needs additional education   Affect/Mood: N/A   Participation Level: Did not attend    Clinical Observations/Individualized Feedback:     Plan: Continue to engage patient in RT group sessions 2-3x/week.   Leandro Berkowitz-McCall, LRT,CTRS 12/03/2023 1:38 PM

## 2023-12-03 NOTE — Progress Notes (Signed)
(  Sleep Hours) -10 (Any PRNs that were needed, meds refused, or side effects to meds)- none (Any disturbances and when (visitation, over night)-n/a (Concerns raised by the patient)- none (SI/HI/AVH)- Denies

## 2023-12-03 NOTE — BHH Counselor (Signed)
 Adult Comprehensive Assessment  Patient ID: ILIA DIMAANO, female   DOB: November 25, 1970, 53 y.o.   MRN: [id number]  2nd attempt on PSA. Pt was in bed with blankets over her head. Inquired about completing assessment and pt stated Oh, no we will not be talking about anything today. Shut my light off and close the door.   Will attempt again tomorrow.    Jenkins LULLA Primer. 12/03/2023

## 2023-12-03 NOTE — BH IP Treatment Plan (Addendum)
 Interdisciplinary Treatment and Diagnostic Plan Update  12/03/2023 Time of Session: 10:35 AM DER GAGLIANO MRN: [id number]  Principal Diagnosis: Bipolar 1 disorder, severe, current or most recent episode manic, with psychotic features (HCC)  Secondary Diagnoses: Principal Problem:   Bipolar 1 disorder, severe, current or most recent episode manic, with psychotic features (HCC) Active Problems:   Agitation   PTSD (post-traumatic stress disorder)   Psychosis (HCC)   Suicidal thoughts   Current Medications:  Current Facility-Administered Medications  Medication Dose Route Frequency Provider Last Rate Last Admin   acetaminophen  (TYLENOL ) tablet 650 mg  650 mg Oral Q6H PRN Tex Drilling, NP       alum & mag hydroxide-simeth (MAALOX/MYLANTA) 200-200-20 MG/5ML suspension 30 mL  30 mL Oral Q4H PRN Tex Drilling, NP       cetirizine (ZYRTEC) tablet 10 mg  10 mg Oral QHS Nkwenti, Doris, NP   10 mg at 12/02/23 2037   haloperidol  (HALDOL ) tablet 5 mg  5 mg Oral BID Chandra Charleston Sherlean Ruth, DO       Or   chlorproMAZINE (THORAZINE) injection 50 mg  50 mg Intramuscular BID Chandra Charleston Christian Lee, DO   50 mg at 12/03/23 0932   haloperidol  (HALDOL ) tablet 5 mg  5 mg Oral TID PRN Tex Drilling, NP       And   diphenhydrAMINE (BENADRYL) capsule 50 mg  50 mg Oral TID PRN Tex Drilling, NP       haloperidol  (HALDOL ) tablet 5 mg  5 mg Oral TID PRN Onuoha, Chinwendu V, NP       And   diphenhydrAMINE (BENADRYL) capsule 50 mg  50 mg Oral TID PRN Onuoha, Chinwendu V, NP       haloperidol  lactate (HALDOL ) injection 5 mg  5 mg Intramuscular TID PRN Tex Drilling, NP       And   diphenhydrAMINE (BENADRYL) injection 50 mg  50 mg Intramuscular TID PRN Tex Drilling, NP       And   LORazepam  (ATIVAN ) injection 2 mg  2 mg Intramuscular TID PRN Tex Drilling, NP       haloperidol  lactate (HALDOL ) injection 10 mg  10 mg Intramuscular TID PRN Tex Drilling, NP       And   diphenhydrAMINE  (BENADRYL) injection 50 mg  50 mg Intramuscular TID PRN Tex Drilling, NP       And   LORazepam  (ATIVAN ) injection 2 mg  2 mg Intramuscular TID PRN Tex Drilling, NP       haloperidol  lactate (HALDOL ) injection 5 mg  5 mg Intramuscular TID PRN Onuoha, Chinwendu V, NP       And   diphenhydrAMINE (BENADRYL) injection 50 mg  50 mg Intramuscular TID PRN Onuoha, Chinwendu V, NP       And   LORazepam  (ATIVAN ) injection 2 mg  2 mg Intramuscular TID PRN Onuoha, Chinwendu V, NP       haloperidol  lactate (HALDOL ) injection 10 mg  10 mg Intramuscular TID PRN Onuoha, Chinwendu V, NP       And   diphenhydrAMINE (BENADRYL) injection 50 mg  50 mg Intramuscular TID PRN Onuoha, Chinwendu V, NP       And   LORazepam  (ATIVAN ) injection 2 mg  2 mg Intramuscular TID PRN Onuoha, Chinwendu V, NP       diphenhydrAMINE (BENADRYL) injection 50 mg  50 mg Intramuscular UD Chandra Charleston Christian Lee, DO   50 mg at 12/03/23 9063   hydrOXYzine  (ATARAX ) tablet  25 mg  25 mg Oral TID PRN Tex Drilling, NP       lamoTRIgine  (LAMICTAL  XR) 24 hour tablet 100 mg  100 mg Oral QHS Tex Drilling, NP   100 mg at 12/02/23 2037   magnesium  hydroxide (MILK OF MAGNESIA) suspension 30 mL  30 mL Oral Daily PRN Tex Drilling, NP       traZODone  (DESYREL ) tablet 50 mg  50 mg Oral QHS PRN Nkwenti, Doris, NP       PTA Medications: Medications Prior to Admission  Medication Sig Dispense Refill Last Dose/Taking   cetirizine (ZYRTEC) 10 MG tablet Take 10 mg by mouth daily.      EPINEPHrine 0.3 mg/0.3 mL IJ SOAJ injection Inject 0.3 mg into the muscle.      lamoTRIgine  (LAMICTAL  XR) 100 MG 24 hour tablet Take 1 tablet (100 mg total) by mouth daily. (Patient taking differently: Take 100 mg by mouth at bedtime.) 30 tablet 2    sulfamethoxazole-trimethoprim (BACTRIM DS) 800-160 MG tablet Take 1 tablet by mouth 2 (two) times daily. 3 more doses       Patient Stressors: Health problems   Medication change or noncompliance    Patient  Strengths: Capable of independent living  Communication skills  Supportive family/friends   Treatment Modalities: Medication Management, Group therapy, Case management,  1 to 1 session with clinician, Psychoeducation, Recreational therapy.   Physician Treatment Plan for Primary Diagnosis: Bipolar 1 disorder, severe, current or most recent episode manic, with psychotic features (HCC) Long Term Goal(s):     Short Term Goals:    Medication Management: Evaluate patient's response, side effects, and tolerance of medication regimen.  Therapeutic Interventions: 1 to 1 sessions, Unit Group sessions and Medication administration.  Evaluation of Outcomes: Not Progressing  Physician Treatment Plan for Secondary Diagnosis: Principal Problem:   Bipolar 1 disorder, severe, current or most recent episode manic, with psychotic features (HCC) Active Problems:   Agitation   PTSD (post-traumatic stress disorder)   Psychosis (HCC)   Suicidal thoughts  Long Term Goal(s):     Short Term Goals:       Medication Management: Evaluate patient's response, side effects, and tolerance of medication regimen.  Therapeutic Interventions: 1 to 1 sessions, Unit Group sessions and Medication administration.  Evaluation of Outcomes: Not Progressing   RN Treatment Plan for Primary Diagnosis: Bipolar 1 disorder, severe, current or most recent episode manic, with psychotic features (HCC) Long Term Goal(s): Knowledge of disease and therapeutic regimen to maintain health will improve  Short Term Goals: Ability to remain free from injury will improve, Ability to verbalize frustration and anger appropriately will improve, Ability to demonstrate self-control, Ability to participate in decision making will improve, Ability to verbalize feelings will improve, Ability to disclose and discuss suicidal ideas, Ability to identify and develop effective coping behaviors will improve, and Compliance with prescribed medications  will improve  Medication Management: RN will administer medications as ordered by provider, will assess and evaluate patient's response and provide education to patient for prescribed medication. RN will report any adverse and/or side effects to prescribing provider.  Therapeutic Interventions: 1 on 1 counseling sessions, Psychoeducation, Medication administration, Evaluate responses to treatment, Monitor vital signs and CBGs as ordered, Perform/monitor CIWA, COWS, AIMS and Fall Risk screenings as ordered, Perform wound care treatments as ordered.  Evaluation of Outcomes: Not Progressing   LCSW Treatment Plan for Primary Diagnosis: Bipolar 1 disorder, severe, current or most recent episode manic, with psychotic features (HCC) Long Term Goal(s):  Safe transition to appropriate next level of care at discharge, Engage patient in therapeutic group addressing interpersonal concerns.  Short Term Goals: Engage patient in aftercare planning with referrals and resources, Increase social support, Increase ability to appropriately verbalize feelings, Increase emotional regulation, Facilitate acceptance of mental health diagnosis and concerns, Facilitate patient progression through stages of change regarding substance use diagnoses and concerns, Identify triggers associated with mental health/substance abuse issues, and Increase skills for wellness and recovery  Therapeutic Interventions: Assess for all discharge needs, 1 to 1 time with Social worker, Explore available resources and support systems, Assess for adequacy in community support network, Educate family and significant other(s) on suicide prevention, Complete Psychosocial Assessment, Interpersonal group therapy.  Evaluation of Outcomes: Not Progressing   Progress in Treatment: Attending groups: attended some groups Participating in groups: Yes. Taking medication as prescribed: Yes. Toleration medication: Yes. Family/Significant other contact  made: consents are pending Patient understands diagnosis: No. Discussing patient identified problems/goals with staff: No. Medical problems stabilized or resolved: Yes. Denies suicidal/homicidal ideation: Yes. Issues/concerns per patient self-inventory: No.  New problem(s) identified:  No  New Short Term/Long Term Goal(s):    medication stabilization, elimination of SI thoughts, development of comprehensive mental wellness plan.    Patient Goals:  You have nothing to offer.  Discharge Plan or Barriers:  Patient recently admitted. CSW will continue to follow and assess for appropriate referrals and possible discharge planning.    medication stabilization, elimination of SI thoughts, development of comprehensive mental wellness plan.     Reason for Continuation of Hospitalization: Anxiety Depression Medication stabilization Suicidal ideation  Estimated Length of Stay:  5 - 7 days  Last 3 [place] Suicide Severity Risk Score: Flowsheet Row Ad[facility]Current) [facility] 11/30/2023 in [facility] ED from 09/28/2023 in [facility]  C-SSRS RISK CATEGORY Moderate Risk Error: Question 6 not populated No Risk    Last PHQ 2/9 Scores:    08/02/2023    4:32 PM 02/15/2018    4:30 PM  Depression screen PHQ 2/9  Decreased Interest 0 0  Down, Depressed, Hopeless 0 0  PHQ - 2 Score 0 0  Altered sleeping 3   Tired, decreased energy 3   Change in appetite 3   Feeling bad or failure about yourself  1   Trouble concentrating 2   Moving slowly or fidgety/restless 0   Suicidal thoughts 0   PHQ-9 Score 12     Scribe for Treatment Team: Salome Hautala O Shakthi Scipio, LCSWA 12/03/2023 12:07 PM

## 2023-12-04 LAB — HEMOGLOBIN A1C
Hgb A1c MFr Bld: 6 % — ABNORMAL HIGH (ref 4.8–5.6)
Mean Plasma Glucose: 125.5 mg/dL

## 2023-12-04 LAB — LIPID PANEL
Cholesterol: 186 mg/dL (ref 0–200)
HDL: 48 mg/dL (ref >40)
LDL Cholesterol: 115 mg/dL — ABNORMAL HIGH (ref 0–99)
Total CHOL/HDL Ratio: 3.9 ratio
Triglycerides: 114 mg/dL (ref <150)
VLDL: 23 mg/dL (ref 0–40)

## 2023-12-04 MED ORDER — HALOPERIDOL 5 MG PO TABS
10.0000 mg | ORAL_TABLET | Freq: Two times a day (BID) | ORAL | Status: DC
  Administered 2023-12-04 – 2023-12-07 (×6): 10 mg via ORAL
  Filled 2023-12-04 (×6): qty 2

## 2023-12-04 MED ORDER — CHLORPROMAZINE HCL 50 MG/2ML IJ SOLN
50.0000 mg | Freq: Two times a day (BID) | INTRAMUSCULAR | Status: DC
  Filled 2023-12-04 (×8): qty 2

## 2023-12-04 NOTE — Plan of Care (Signed)
   Problem: Education: Goal: Emotional status will improve Outcome: Progressing Goal: Mental status will improve Outcome: Progressing   Problem: Activity: Goal: Sleeping patterns will improve Outcome: Progressing   Problem: Coping: Goal: Ability to verbalize frustrations and anger appropriately will improve Outcome: Progressing   Problem: Safety: Goal: Periods of time without injury will increase Outcome: Progressing

## 2023-12-04 NOTE — Group Note (Signed)
 Date:  12/04/2023 Time:  8:54 PM  Group Topic/Focus:  Wrap-Up Group:   The focus of this group is to help patients review their daily goal of treatment and discuss progress on daily workbooks.    Participation Level:  Did Not Attend   Alicia Porter 12/04/2023, 8:54 PM

## 2023-12-04 NOTE — Progress Notes (Signed)
 A nurse gave information for the patient's guardian Angeline to CSW. The CSW called Angeline at [phone]. No answer. The CSW left voicemail message for [place].

## 2023-12-04 NOTE — Progress Notes (Signed)
(  Sleep Hours) - 11 (Any PRNs that were needed, meds refused, or side effects to meds)-  PRN Trazodone  (Any disturbances and when (visitation, over night)- None (Concerns raised by the patient)-  None (SI/HI/AVH)- Passive SI, Contracts for safety. Denies HI and AVH

## 2023-12-04 NOTE — Progress Notes (Signed)
 Patient prompted for EKG x 2, declined stated ' I did one already, I'm not doing it!   12/04/23 0900  Psychosocial Assessment  Patient Complaints Anxiety  Eye Contact Avoids  Facial Expression Flat  Affect Appropriate to circumstance  Speech Logical/coherent  Interaction Guarded  Motor Activity Fidgety  Appearance/Hygiene Unremarkable  Behavior Characteristics Guarded  Mood Preoccupied  Thought Process  Coherency Disorganized  Content Preoccupation  Delusions None reported or observed  Perception Derealization  Hallucination None reported or observed  Judgment Poor  Confusion Mild  Danger to Self  Current suicidal ideation? Passive  Agreement Not to Harm Self Yes  Description of Agreement verbal  Danger to Others  Danger to Others None reported or observed

## 2023-12-04 NOTE — BHH Group Notes (Signed)
Patient did not attend social work group.

## 2023-12-04 NOTE — Plan of Care (Signed)
   Problem: Safety: Goal: Periods of time without injury will increase Outcome: Progressing

## 2023-12-04 NOTE — Group Note (Signed)
 LCSW Group Therapy Note  Group Date: 12/04/2023 Start Time: 1000 End Time: 1045   Type of Therapy and Topic:  Group Therapy - Healthy vs Unhealthy Coping Skills  Participation Level:  Minimal   Description of Group The focus of this group was to determine what unhealthy coping techniques typically are used by group members and what healthy coping techniques would be helpful in coping with various problems. Patients were guided in becoming aware of the differences between healthy and unhealthy coping techniques. Patients were asked to identify 2-3 healthy coping skills they would like to learn to use more effectively.  Therapeutic Goals Patients learned that coping is what human beings do all day long to deal with various situations in their lives Patients defined and discussed healthy vs unhealthy coping techniques Patients identified their preferred coping techniques and identified whether these were healthy or unhealthy Patients determined 2-3 healthy coping skills they would like to become more familiar with and use more often. Patients provided support and ideas to each other   Summary of Patient Progress:  The patient care in the last ten minute and stated a comment that offended the other group.    Therapeutic Modalities Cognitive Behavioral Therapy Motivational Interviewing  Ozzie Knobel O Wreatha Sturgeon, LCSWA 12/04/2023  2:45 PM

## 2023-12-04 NOTE — Progress Notes (Signed)
 Psychiatric Admission Assessment Adult  Patient Identification: Alicia Porter MRN:  [id number] Date of Evaluation:  12/03/2023 Chief Complaint:  suicidal thoughts Principal Diagnosis: Bipolar 1 disorder, severe, current or most recent episode manic, with psychotic features (HCC) Diagnosis:  Principal Problem:   Bipolar 1 disorder, severe, current or most recent episode manic, with psychotic features (HCC) Active Problems:   Agitation   PTSD (post-traumatic stress disorder)   Psychosis (HCC)   Suicidal thoughts   CC: suicidal thoughts with a plan  Alicia Porter is a 52 y.o. female  with a past psychiatric history of alcohol  use disorder and depression, most recently hospitalized in 2018 for suicidal thoughts.  Collateral information reports a more recent diagnosis of bipolar disorder.  It appears that she completed the [facility] IOP program for alcohol  use issues about 2 months ago.  On the present occasion, she presented to the [facility] behavioral health urgent care reporting suicidal thoughts, accompanied by her longtime friend.  After her initial report of suicidal thoughts, she abruptly refused to speak to anyone.  She is currently admitted to the [facility] behavioral hospital on an involuntary basis.  Last 24 Hours:  Accepted evening dose of Haldol  orally with no requirement of forced IM medications  Today's Interview:  Mild improvement in her mood, affect and behavior.  She is now out of her room and walking around.  However, she is still very belligerent, will not allow staff to take vital signs or labs.  Patient continues to curse at staff for unknown and unpredictable reasons.  Continues to have intermittently disorganized speech she believes that I am not a doctor and am a consulting civil engineer.  She is verbally aggressive with me though does not physically lash out.  No observed symptoms of EPS.  Unable to determine review of systems because she says no to all questions.  Any speech  that comes voluntarily from her is her own proactive aggressive statements towards us . Continues to have gross delusions of paranoia with grossly impaired insight and judgment. Continues to request to be discharged immediately because we are not doctors or a medical institution.  Does not agree that there is anything wrong with her.   Columbia Scale:  Flowsheet Row [facility]) [facility] 11/30/2023 in [facility] ED from 09/28/2023 in [facility] Emergency Department at [facility]  C-SSRS RISK CATEGORY Moderate Risk Error: Question 6 not populated No Risk     Lab Results:  No results found for this or any previous visit (from the past 48 hours).   Blood Alcohol  level:  Lab Results  Component Value Date   [facility] <15 11/30/2023   ETH <5 10/08/2016    Metabolic Disorder Labs:  See assessment and plan.    Psychiatric Specialty Exam:  Presentation  General Appearance:  Appropriate for Environment; Casual  Eye Contact: None  Speech: Normal Rate; Clear and Coherent  Speech Volume: Loud   Mood and Affect  Mood: Irritable; Labile belligerent  Affect: Labile   Thought Process  Thought Processes: Disorganized  Descriptions of Associations: Tangential  Orientation: Other (comment) (unable to assess)  Thought Content: Tangential  Hallucinations: Unable to assess Ideas of Reference: -- (unable to assess)  Suicidal Thoughts: Patient does not answer Homicidal Thoughts: Makes violent threats towards interviewer  Sensorium  Memory: Other (comment) (unable to assess)  Judgment: Impaired  Insight: Lacking   Executive Functions  Concentration: Other (comment) (unable  to assess)  Attention Span: Other (comment) (unable to assess)  Recall: Other (comment) (unable to assess)  Fund of Knowledge: Other (comment) (unable to  assess)  Language: Good   Psychomotor Activity  Psychomotor Activity:No data recorded  Assets  Assets: Social Support   Sleep  Sleep: Estimated Sleeping Duration (Last 24 Hours): 8.50-9.75 hours  Physical Exam: Physical Exam Constitutional:      Appearance: She is obese.  Pulmonary:     Effort: Pulmonary effort is normal.  Neurological:     Mental Status: She is alert.      Comments: Oriented to self, month, year. Not oriented to president, date, location.  Psychiatric:        Mood and Affect: Affect is labile and angry.        Behavior: Behavior is agitated and aggressive.        Thought Content: Thought content is paranoid.        Judgment: Judgment is impulsive.    ROS Blood pressure 111/75, pulse 86, resp. rate 20, weight 107.8 kg, SpO2 98%. Body mass index is 38.35 kg/m.   Treatment Plan Summary: Daily contact with patient to assess and evaluate symptoms and progress in treatment   ASSESSMENT & PLAN  ASSESSMENT: Based on patient assessment and collateral information, patient appears psychotic with underlying bipolar manic symptoms.      Diagnoses / Active Problems:  Principal Problem:   Bipolar 1 disorder, severe, current or most recent episode manic, with psychotic features [facility]) Active Problems:   Agitation   PTSD (post-traumatic stress disorder)   Psychosis (HCC)   Suicidal thoughts  Follow-up on 12/04/2023 -Continues to be belligerent and verbally abusive/aggressive.  Speech remains intermittently disorganized and the patient is hyperfocused on discharge.  Patient continues to have paranoid delusions thinking that we are trying to poison her and this is not a hospital.  Demands discharge immediately.  Otherwise, she is now getting out of her room today for the first time since admission.  Excepted oral Haldol  without need of forced medication protocol today.   PLAN: Safety and Monitoring:             -- Involuntary admission to inpatient  psychiatric unit for safety, stabilization and treatment             -- Daily contact with patient to assess and evaluate symptoms and progress in treatment             -- Patient's case to be discussed in multi-disciplinary team meeting             -- Observation Level : q15 minute checks             -- Vital signs:  q12 hours             -- Precautions: suicide, elopement, and assault   2. Psychiatric Treatment:  -- Increase Haldol  10 mg p.o. twice daily or Thorazine 50 mg IM with Diphenhydramine 50mg  IM twice daily - Patient is on forced medication protocol, see prior documentation - Will discontinue Lamictal , no benefit --Recommend Haldol  decanoate LAI prior to discharge  -- Continue trazodone  50 mg once nightly as needed for insomnia  -- Continue hydroxyzine  25 mg 3 times daily as needed for anxiety  -Ordering EKG, A1C and Lipid panel for antipsychotic monitoring   --  The risks/benefits/side-effects/alternatives to this medication were discussed in detail with the patient and time was given for questions. The patient consents to medication trial.              --  Metabolic profile and EKG monitoring obtained while on an atypical antipsychotic. See #4 below for values.              -- Encouraged patient to participate in unit milieu and in scheduled group therapies              -- Short Term Goals: Ability to identify changes in lifestyle to reduce recurrence of condition will improve, Ability to verbalize feelings will improve, Ability to disclose and discuss suicidal ideas, Ability to demonstrate self-control will improve, Ability to identify and develop effective coping behaviors will improve, Ability to maintain clinical measurements within normal limits will improve, Compliance with prescribed medications will improve, and Ability to identify triggers associated with substance abuse/mental health issues will improve             -- Long Term Goals: Improvement in symptoms so as ready for  discharge                3. Medical Issues Being Addressed:              -- A1c last 6.1, patient does have a history of diabetes - UDS negative, BAL negative, CMP/CBC unremarkable    -- Continue PRN's: Tylenol , Maalox, Milk of Magnesia   4. Routine and other pertinent labs reviewed: EKG monitoring: QTc: Not obtained.  Last EKG from 2018 within normal limits.  BMI: Body mass index is 38.35 kg/m. Prolactin: Lab Results  Component Value Date   PROLACTIN 9.9 11/30/2023   PROLACTIN 42.8 (H) 10/12/2016    Lipid Panel: Lab Results  Component Value Date   CHOL 213 (H) 01/25/2018   TRIG 124 01/25/2018   HDL 38 (L) 01/25/2018   CHOLHDL 5.6 (H) 01/25/2018   VLDL 27 10/12/2016   LDLCALC 150 (H) 01/25/2018   LDLCALC 96 10/12/2016    HbgA1c: Hgb A1c MFr Bld (%)  Date Value  01/25/2018 5.8 (H)    TSH: TSH (uIU/mL)  Date Value  11/30/2023 1.155  01/25/2018 2.040     5. Discharge Planning:              -- Social work and case management to assist with discharge planning and identification of hospital follow-up needs prior to discharge             -- Estimated LOS: At least 7 days             -- Discharge Concerns: Need to establish a safety plan; Medication compliance and effectiveness             -- Discharge Goals: Return home with outpatient referrals for mental health follow-up including medication management/psychotherapy        Lamar Slain, DO

## 2023-12-04 NOTE — BHH Counselor (Signed)
 Adult Comprehensive Assessment  Patient ID: Alicia Porter, female   DOB: 12-04-1970, 53 y.Alicia Porter.   MRN: [id number]  Information Source:    Current Stressors:     Living/Environment/Situation:  Living Arrangements: Other (Comment) (Pt will not responds I don't know.)  Family History:     Childhood History:     Education:     Employment/Work Situation:      Surveyor, Quantity Resources:      Alcohol /Substance Abuse:      Social Support System:      Leisure/Recreation:      Strengths/Needs:      Discharge Plan:      Summary/Recommendations:   Summary and Recommendations (to be completed by the evaluator): CSW 3rd attempt PSA untainable. The patient stated no, I will not do an assessment. The patient gave permission to talk to Angeline (guardian). The CSW called , no response. CSW left voicemail. Per chart, The patient has demonstrated gross evidence of paranoid and bizarre delusions with agitation. She makes frequent HI towards myself and staff. She presented initially with SI, though behavior has monstly been agitated. Given her disorganized thought process and paranoid delusions, a 2nd opinion was sought, and forced medication protcol ws initiated due to concern for threat to staff and ongoing untreated psychosis.     Hx from chart review reveals previous Dx of Bipolar, MDD with psychosis, Borderline and agitation .  Alicia Porter Alicia Porter Alicia Porter. 12/04/2023

## 2023-12-04 NOTE — Group Note (Signed)
 Date:  12/04/2023 Time:  8:40 AM  Group Topic/Focus:  Goals Group:   The focus of this group is to help patients establish daily goals to achieve during treatment and discuss how the patient can incorporate goal setting into their daily lives to aide in recovery.    Participation Level:  Did Not Attend  Participation Quality:    Affect:    Cognitive:   Insight:   Engagement in Group:    Modes of Intervention:    Additional Comments:    Haleema Vanderheyden C Hillel Card 12/04/2023, 8:40 AM

## 2023-12-04 NOTE — Group Note (Deleted)
 LCSW Group Therapy Note  Group Date: 12/04/2023 Start Time: 1000 End Time: 1100   Type of Therapy and Topic:  Group Therapy - Healthy vs Unhealthy Coping Skills  Participation Level:  {BHH PARTICIPATION [id number]}   Description of Group The focus of this group was to determine what unhealthy coping techniques typically are used by group members and what healthy coping techniques would be helpful in coping with various problems. Patients were guided in becoming aware of the differences between healthy and unhealthy coping techniques. Patients were asked to identify 2-3 healthy coping skills they would like to learn to use more effectively.  Therapeutic Goals 1. Patients learned that coping is what human beings do all day long to deal with various situations in their lives 2. Patients defined and discussed healthy vs unhealthy coping techniques 3. Patients identified their preferred coping techniques and identified whether these were healthy or unhealthy 4. Patients determined 2-3 healthy coping skills they would like to become more familiar with and use more often. 5. Patients provided support and ideas to each other   Summary of Patient Progress:  During group, *** expressed ***. Patient proved open to input from peers and feedback from CSW. Patient demonstrated *** insight into the subject matter, was respectful of peers, and participated throughout the entire session.   Therapeutic Modalities Cognitive Behavioral Therapy Motivational Interviewing  Camelia Olden, LCSWA 12/04/2023  2:49 PM

## 2023-12-05 NOTE — Group Note (Signed)
 Date:  12/05/2023 Time:  8:30 PM  Group Topic/Focus:  Wrap-Up Group:   The focus of this group is to help patients review their daily goal of treatment and discuss progress on daily workbooks.    Participation Level:  Did Not Attend   Aking Klabunde Dacosta 12/05/2023, 8:30 PM

## 2023-12-05 NOTE — Progress Notes (Signed)
 Pt refused AM vital signs check
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
## 2023-12-05 NOTE — Plan of Care (Signed)
   Problem: Education: Goal: Emotional status will improve Outcome: Progressing Goal: Mental status will improve Outcome: Progressing Goal: Verbalization of understanding the information provided will improve Outcome: Progressing

## 2023-12-05 NOTE — Progress Notes (Signed)
(  Sleep Hours) - 10 (Any PRNs that were needed, meds refused, or side effects to meds)- PRN Trazodone  (Any disturbances and when (visitation, over night)- None (Concerns raised by the patient)- None (SI/HI/AVH)- Denies HI/AVH. Passive SI, contracts for safety.

## 2023-12-05 NOTE — [facility] Suicide Risk Assessment (Signed)
 [facility] INPATIENT:  Family/Significant Other Suicide Prevention Education  Suicide Prevention Education:  Education Completed; Alicia Porter,  ([phone] ) has been identified by the patient as the family member/significant other with whom the patient will be residing, and identified as the person(s) who will aid the patient in the event of a mental health crisis (suicidal ideations/suicide attempt).  With written consent from the patient, the family member/significant other has been provided the following suicide prevention education, prior to the and/or following the discharge of the patient.  The suicide prevention education provided includes the following: Suicide risk factors Suicide prevention and interventions National Suicide Hotline telephone number [facility] assessment telephone number [facility] Emergency Assistance [address] [facility] and/or Residential Mobile Crisis Unit telephone number  Request made of family/significant other to: Remove weapons (e.g., guns, rifles, knives), all items previously/currently identified as safety concern.   Remove drugs/medications (over-the-counter, prescriptions, illicit drugs), all items previously/currently identified as a safety concern.  The family member/significant other verbalizes understanding of the suicide prevention education information provided.  The family member/significant other agrees to remove the items of safety concern listed above.  Alicia Porter 12/05/2023, 3:48 PM

## 2023-12-05 NOTE — Progress Notes (Signed)
 Psychiatric Admission Assessment Adult  Patient Identification: Alicia Porter MRN:  [id number] Date of Evaluation:  12/03/2023 Chief Complaint:  suicidal thoughts Principal Diagnosis: Bipolar 1 disorder, severe, current or most recent episode manic, with psychotic features (HCC) Diagnosis:  Principal Problem:   Bipolar 1 disorder, severe, current or most recent episode manic, with psychotic features (HCC) Active Problems:   Agitation   PTSD (post-traumatic stress disorder)   Psychosis (HCC)   Suicidal thoughts   CC: suicidal thoughts with a plan  Alicia Porter is a 53 y.o. female  with a past psychiatric history of alcohol  use disorder and depression, most recently hospitalized in 2018 for suicidal thoughts.  Collateral information reports a more recent diagnosis of bipolar disorder.  It appears that she completed the [facility] IOP program for alcohol  use issues about 2 months ago.  On the present occasion, she presented to the [facility] behavioral health urgent care reporting suicidal thoughts, accompanied by her longtime friend.  After her initial report of suicidal thoughts, she abruptly refused to speak to anyone.  She is currently admitted to the [facility] behavioral hospital on an involuntary basis.  Last 24 Hours:  Accepted evening dose of Haldol  orally with no requirement of forced IM medications  Today's Interview:  -The patient spoke with me for the first time today though was still very paranoid and belligerent.  She informed me that she is experiencing no side effects from her medications and no EPS.  Review of systems is negative.  Continues to be paranoid about the hospitalization and very cautious and the information that she reveals.  Endorses active suicidal ideations and tells me that she will kill herself soon.   She has a very strong and negative response when I ask her about lithium  and states that she will not take lithium  or any other antipsychotic.  Currently living  with a close friend in a house they rent.  States that she will return there after discharge.   Columbia Scale:  Flowsheet Row [facility] (Current) [facility] from [facility]30/2023 in [facility] ED from 09/28/2023 in [facility] Emergency Department at [facility]  C-SSRS RISK CATEGORY Moderate Risk Error: Question 6 not populated No Risk     Lab Results:  No results found for this or any previous visit (from the past 48 hours).   Blood Alcohol  level:  Lab Results  Component Value Date   [facility] <15 11/30/2023   ETH <5 10/08/2016    Metabolic Disorder Labs:  See assessment and plan.    Psychiatric Specialty Exam:  Presentation  General Appearance:  Appropriate for Environment; Casual  Eye Contact: None  Speech: Normal Rate; Clear and Coherent  Speech Volume: Loud   Mood and Affect  Mood: Irritable; Labile belligerent  Affect: Labile   Thought Process  Thought Processes: Previously disorganized, improving  Descriptions of Associations: Tangential  Orientation: Oriented to person, place and time  Thought Content: Aggressive  Hallucinations: Unable to assess    Suicidal Thoughts: Patient has active suicidal ideations Homicidal Thoughts: Makes violent threats towards interviewer  Sensorium  Memory: Adequate  Judgment: Impaired  Insight: Lacking   Executive Functions  Concentration: poor  Attention Span: poor  Recall: poor  Fund of Knowledge: Other (comment) (unable to assess)  Language: Good   Psychomotor Activity  Psychomotor Activity: Restless  Assets  Assets: Social Support   Sleep  Sleep: Estimated Sleeping Duration (Last 24 Hours):  8.50-9.75 hours  Physical Exam: Physical Exam Constitutional:      Appearance: She is obese.  Pulmonary:     Effort: Pulmonary effort is normal.  Neurological:     Mental Status: She is  alert.      Comments: Oriented to self, month, year. Not oriented to president, date, location.  Psychiatric:        Mood and Affect: Affect is labile and angry.        Behavior: Behavior is agitated and aggressive.        Thought Content: Thought content is paranoid.        Judgment: Judgment is impulsive.    ROS Blood pressure 111/75, pulse 86, resp. rate 20, weight 107.8 kg, SpO2 98%. Body mass index is 38.35 kg/m.   Treatment Plan Summary: Daily contact with patient to assess and evaluate symptoms and progress in treatment   ASSESSMENT: Based on patient assessment and collateral information, patient appears psychotic with underlying bipolar manic symptoms.      Diagnoses / Active Problems:  Principal Problem:   Bipolar 1 disorder, severe, current or most recent episode manic, with psychotic features [facility]) Active Problems:   Agitation   PTSD (post-traumatic stress disorder)   Psychosis (HCC)   Suicidal thoughts  Follow-up on 12/04/2023 -Continues to be belligerent and verbally abusive/aggressive.  Speech remains intermittently disorganized and the patient is hyperfocused on discharge.  Patient continues to have paranoid delusions thinking that we are trying to poison her and this is not a hospital.  Demands discharge immediately.  Otherwise, she is now getting out of her room today for the first time since admission.  Excepted oral Haldol  without need of forced medication protocol today.   Follow Up 12/05/2023 -Accepted my interview today and spoke with we for the first time, as well as accepted VS and medications. -Not as irritable, denies SE of medications and ROS/EPS is negative. -Says she will return to the home she is renting with her best friend.  -Still paranoid and aggressive -Has active SI, states she will kill herself  -Historically will become non-compliant with Lithium  and antipsychotics. She would benefit from a Haldol  Deconate, though likely not going to  accept it.   PLAN: Safety and Monitoring:             -- Involuntary admission to inpatient psychiatric unit for safety, stabilization and treatment             -- Daily contact with patient to assess and evaluate symptoms and progress in treatment             -- Patient's case to be discussed in multi-disciplinary team meeting             -- Observation Level : q15 minute checks             -- Vital signs:  q12 hours             -- Precautions: suicide, elopement, and assault   2. Psychiatric Treatment:  -- Continue Haldol  10 mg p.o. twice daily or Thorazine 50 mg IM with Diphenhydramine 50mg  IM twice daily - Patient is on forced medication protocol, see prior documentation - Discontinued Lamictal , no benefit --Recommend Haldol  decanoate LAI prior to discharge if she accepts --Pt refuses Lithium   -- Continue trazodone  50 mg once nightly as needed for insomnia  -- Continue hydroxyzine  25 mg 3 times daily as needed for anxiety  -- Ordering EKG for antipsychotic monitoring   --  The risks/benefits/side-effects/alternatives to this medication were discussed in detail with the patient and time was given for questions. The patient consents to medication trial.              -- Metabolic profile and EKG monitoring obtained while on an atypical antipsychotic. See #4 below for values.              -- Encouraged patient to participate in unit milieu and in scheduled group therapies              -- Short Term Goals: Ability to identify changes in lifestyle to reduce recurrence of condition will improve, Ability to verbalize feelings will improve, Ability to disclose and discuss suicidal ideas, Ability to demonstrate self-control will improve, Ability to identify and develop effective coping behaviors will improve, Ability to maintain clinical measurements within normal limits will improve, Compliance with prescribed medications will improve, and Ability to identify triggers associated with substance  abuse/mental health issues will improve             -- Long Term Goals: Improvement in symptoms so as ready for discharge                3. Medical Issues Being Addressed:              -- Hgb A1c 6.0  -- Lipids: elevated LDL - UDS negative, BAL negative, CMP/CBC unremarkable    -- Continue PRN's: Tylenol , Maalox, Milk of Magnesia   4. Routine and other pertinent labs reviewed: EKG monitoring: QTc: Not obtained.  Last EKG from 2018 within normal limits.  BMI: Body mass index is 38.35 kg/m. Prolactin: Lab Results  Component Value Date   PROLACTIN 9.9 11/30/2023   PROLACTIN 42.8 (H) 10/12/2016    Lipid Panel: Lab Results  Component Value Date   CHOL 213 (H) 01/25/2018   TRIG 124 01/25/2018   HDL 38 (L) 01/25/2018   CHOLHDL 5.6 (H) 01/25/2018   VLDL 27 10/12/2016   LDLCALC 150 (H) 01/25/2018   LDLCALC 96 10/12/2016    HbgA1c: Hgb A1c MFr Bld (%)  Date Value  01/25/2018 5.8 (H)    TSH: TSH (uIU/mL)  Date Value  11/30/2023 1.155  01/25/2018 2.040     5. Discharge Planning:              -- Social work and case management to assist with discharge planning and identification of hospital follow-up needs prior to discharge             -- Estimated LOS: At least 7 day admission             -- Discharge Concerns: Need to establish a safety plan; Medication compliance and effectiveness             -- Discharge Goals: Return home with outpatient referrals for mental health follow-up including medication management/psychotherapy        Lamar Slain, DO

## 2023-12-05 NOTE — Progress Notes (Signed)
   12/05/23 0831  Psych Admission Type (Psych Patients Only)  Admission Status Involuntary  Psychosocial Assessment  Patient Complaints Irritability  Eye Contact Brief  Facial Expression Flat  Affect Appropriate to circumstance  Speech Logical/coherent  Interaction Guarded;Minimal  Motor Activity Restless  Appearance/Hygiene Unremarkable  Behavior Characteristics Cooperative;Irritable  Mood Preoccupied  Thought Process  Coherency Disorganized  Content Preoccupation  Delusions None reported or observed  Perception Derealization  Hallucination None reported or observed  Judgment Poor  Confusion None  Danger to Self  Current suicidal ideation? Denies  Agreement Not to Harm Self Yes  Description of Agreement Verbal

## 2023-12-05 NOTE — Progress Notes (Signed)
 The patient asked to speak to CSW  and stated that I am ready to my initial assessment. CSW sat in lock day room with no interruption. CSW gave permission to spoke to Regina Zanin (life partner/ retire counselor) [phone]

## 2023-12-05 NOTE — Plan of Care (Signed)
   Problem: Safety: Goal: Periods of time without injury will increase Outcome: Progressing

## 2023-12-05 NOTE — [facility] Group Notes (Signed)
 Adult Psychoeducational Group Note  Date:  12/05/2023 Time:  10:29 AM  Group Topic/Focus:  Goals Group:   The focus of this group is to help patients establish daily goals to achieve during treatment and discuss how the patient can incorporate goal setting into their daily lives to aide in recovery.  Participation Level:  Did Not Attend  Alicia Porter 12/05/2023, 10:29 AM

## 2023-12-06 MED ORDER — LAMOTRIGINE ER 50 MG PO TB24
100.0000 mg | ORAL_TABLET | Freq: Every day | ORAL | Status: DC
  Administered 2023-12-06 – 2023-12-10 (×5): 100 mg via ORAL
  Filled 2023-12-06 (×2): qty 2

## 2023-12-06 NOTE — Group Note (Signed)
 LCSW Group Therapy Note   Group Date: 12/06/2023 Start Time: 1100 End Time: 1200   Participation:  patient was present and actively participated in the discussion.  Type of Therapy:  Group Therapy  Topic:  From "One Day" to "Today is Day One": Begin Your Journey to Better Health and Mental Well-Being  Objective:  To educate participants on the importance of routine, sleep, diet, and movement for improving mental health and overall well-being. Encourage goal-setting for small, achievable changes.  3 Goals: Encourage participants to set one small, achievable goal related to sleep, diet, or movement for the coming week. Promote self-awareness by discussing the connection between physical and mental health. Support accountability by having participants share goals with the group.  Summary: Participants engaged in a discussion about lifestyle factors influencing mental health, including routines, sleep, nutrition, and movement. They set personal goals for the week to improve well-being and shared their goals for accountability.  Therapeutic Modalities: Cognitive Behavioral Therapy (CBT) for exploring the relationship between thoughts, feelings, and behaviors. Psychoeducation on lifestyle changes and their impact on mental health. Goal-setting to promote empowerment and motivation.   Alicia Porter O Alicia Porter, LCSWA 12/06/2023  4:38 PM

## 2023-12-06 NOTE — Progress Notes (Signed)
 Psychiatric Admission Assessment Adult  Patient Identification: Alicia Porter MRN:  [id number] Date of Evaluation:  12/03/2023 Chief Complaint:  suicidal thoughts Principal Diagnosis: Bipolar 1 disorder, severe, current or most recent episode manic, with psychotic features (HCC) Diagnosis:  Principal Problem:   Bipolar 1 disorder, severe, current or most recent episode manic, with psychotic features (HCC) Active Problems:   Agitation   PTSD (post-traumatic stress disorder)   Psychosis (HCC)   Suicidal thoughts   CC: suicidal thoughts with a plan  Alicia Porter is a 53 y.o. female  with a past psychiatric history of alcohol  use disorder and depression, most recently hospitalized in 2018 for suicidal thoughts.  Collateral information reports a more recent diagnosis of bipolar disorder.  It appears that she completed the [facility] IOP program for alcohol  use issues about 2 months ago.  On the present occasion, she presented to the [facility] behavioral health urgent care reporting suicidal thoughts, accompanied by her longtime friend.  After her initial report of suicidal thoughts, she abruptly refused to speak to anyone.  She is currently admitted to the [facility] behavioral hospital on an involuntary basis.  Last 24 Hours:  Accepted Haldol  orally with no requirement of forced IM medications  Today's Interview:  The patient appears to have demonstrated improvement over the past 2 days.  Today she engages fairly well in interview for about 5 to 10 minutes.  She becomes excessively irritable after this and begins yelling.  Her engagement during the first part of the interview is a significant improvement compared to previous days.  She reports frustrations with her sleep, which she relates to chronic pain.  She discusses various other issues with her chronic pain.  When asked about the events that led to her hospitalization she ruminates on this whole thing is a waste of time.  When asked  about thoughts of self-harm, she reports I have thoughts of not wanting to be alive, if I have to be in here with these kind of people.  She says I know I cannot leave here until Wednesday, because that's when my roommate gets back.  She denies experiencing any hallucinations.  She only makes one comment that seems unusual, saying something about the vents in here.   Columbia Scale:  Flowsheet Row Admission (Current) [facility] from 11/30/2023 in [facility] ED from 09/28/2023 in [facility] Department at [facility]  C-SSRS RISK CATEGORY Moderate Risk Error: Question 6 not populated No Risk     Lab Results:  No results found for this or any previous visit (from the past 48 hours).   Blood Alcohol  level:  Lab Results  Component Value Date   [facility] <15 11/30/2023   ETH <5 10/08/2016    Metabolic Disorder Labs:  See assessment and plan.    Psychiatric Specialty Exam:  Presentation  General Appearance:  Appropriate for Environment; Casual  Eye Contact: None  Speech: Normal Rate; Clear and Coherent  Speech Volume: Loud   Mood and Affect  Mood: Irritable; Labile belligerent  Affect: Labile   Thought Process  Thought Processes: Previously disorganized, improving  Descriptions of Associations: Tangential  Orientation: Oriented to person, place and time  Thought Content: Unusual comment that may be paranoid in nature, as described above Fairly logical  Hallucinations: Unable to assess    Suicidal Thoughts: Patient has passive suicidal ideations Homicidal Thoughts: Denies  Sensorium  Memory: Adequate  Judgment: Impaired  Insight: Lacking   Executive Functions  Concentration: poor  Attention Span: poor  Recall: poor  Fund of Knowledge: Other (comment) (unable to assess)  Language: Good   Psychomotor Activity  Psychomotor  Activity: Restless  Assets  Assets: Social Support   Sleep  Sleep: Estimated Sleeping Duration (Last 24 Hours): 8.50-9.75 hours  Physical Exam: Physical Exam Constitutional:      Appearance: She is obese.  Pulmonary:     Effort: Pulmonary effort is normal.  Neurological:     Mental Status: She is alert.      Comments: Oriented to self, month, year. Not oriented to president, date, location.  Psychiatric:        Mood and Affect: Affect is labile and angry.       ROS Blood pressure 111/75, pulse 86, resp. rate 20, weight 107.8 kg, SpO2 98%. Body mass index is 38.35 kg/m.   Treatment Plan Summary: Daily contact with patient to assess and evaluate symptoms and progress in treatment   ASSESSMENT: Based on patient assessment and collateral information, patient appears psychotic with underlying bipolar manic symptoms.      Diagnoses / Active Problems:  Principal Problem:   Bipolar 1 disorder, severe, current or most recent episode manic, with psychotic features (HCC) Active Problems:   Agitation   PTSD (post-traumatic stress disorder)   Psychosis (HCC)   Suicidal thoughts   PLAN: Safety and Monitoring:             -- Involuntary admission to inpatient psychiatric unit for safety, stabilization and treatment             -- Daily contact with patient to assess and evaluate symptoms and progress in treatment             -- Patient's case to be discussed in multi-disciplinary team meeting             -- Observation Level : q15 minute checks             -- Vital signs:  q12 hours             -- Precautions: suicide, elopement, and assault   2. Psychiatric Treatment:  -- Continue Haldol  10 mg p.o. twice daily or Thorazine 50 mg IM  twice daily - Patient is on forced medication protocol, see prior documentation - Lamotrigine  discontinued 3 days ago due to lack of perceived benefit.  Patient requests this medication be restarted.  Will restart at prior  dose. --Recommend Haldol  decanoate LAI prior to discharge if she accepts -- Pt refuses Lithium   -- Continue trazodone  50 mg once nightly as needed for insomnia  -- Continue hydroxyzine  25 mg 3 times daily as needed for anxiety  -- Ordering EKG for antipsychotic monitoring   --  The risks/benefits/side-effects/alternatives to this medication were discussed in detail with the patient and time was given for questions. The patient consents to medication trial.              -- Metabolic profile and EKG monitoring obtained while on an atypical antipsychotic. See #4 below for values.              -- Encouraged patient to participate in unit milieu and in scheduled group therapies              -- Short Term Goals: Ability to identify changes in lifestyle to reduce recurrence of condition will improve, Ability to verbalize feelings will improve, Ability to disclose and discuss suicidal ideas,  Ability to demonstrate self-control will improve, Ability to identify and develop effective coping behaviors will improve, Ability to maintain clinical measurements within normal limits will improve, Compliance with prescribed medications will improve, and Ability to identify triggers associated with substance abuse/mental health issues will improve             -- Long Term Goals: Improvement in symptoms so as ready for discharge                3. Medical Issues Being Addressed:              -- Hgb A1c 6.0  -- Lipids: elevated LDL - UDS negative, BAL negative, CMP/CBC unremarkable    -- Continue PRN's: Tylenol , Maalox, Milk of Magnesia  Start CPAP tonight   4. Routine and other pertinent labs reviewed: EKG monitoring: QTc: Not obtained.  Last EKG from 2018 within normal limits.  BMI: Body mass index is 38.35 kg/m. Prolactin: Lab Results  Component Value Date   PROLACTIN 9.9 11/30/2023   PROLACTIN 42.8 (H) 10/12/2016    Lipid Panel: Lab Results  Component Value Date   CHOL 213 (H) 01/25/2018    TRIG 124 01/25/2018   HDL 38 (L) 01/25/2018   CHOLHDL 5.6 (H) 01/25/2018   VLDL 27 10/12/2016   LDLCALC 150 (H) 01/25/2018   LDLCALC 96 10/12/2016    HbgA1c: Hgb A1c MFr Bld (%)  Date Value  01/25/2018 5.8 (H)    TSH: TSH (uIU/mL)  Date Value  11/30/2023 1.155  01/25/2018 2.040     5. Discharge Planning:              -- Social work and case management to assist with discharge planning and identification of hospital follow-up needs prior to discharge             -- Discharge Concerns: Need to establish a safety plan; Medication compliance and effectiveness             -- Discharge Goals: Return home with outpatient referrals for mental health follow-up including medication management/psychotherapy     Karleen Kaufmann, MD PGY-4

## 2023-12-06 NOTE — BH Assessment (Signed)
(  Sleep Hours) - 9 (Any PRNs that were needed, meds refused, or side effects to meds)-  (Any disturbances and when (visitation, over night)- None (Concerns raised by the patient)- None (SI/HI/AVH)- Denies

## 2023-12-06 NOTE — Plan of Care (Signed)
   Problem: Education: Goal: Knowledge of [facility] Education information/materials will improve Outcome: Progressing Goal: Verbalization of understanding the information provided will improve Outcome: Progressing

## 2023-12-06 NOTE — Progress Notes (Signed)
 Patient denies SI, AH, VH. Patient scored 6/10 on anxiety and depression. Patient was verbally aggressive and defensive but med compliant.    Mercie VEAR Banana, RN 11:06 AM    12/06/23 0830  Psych Admission Type (Psych Patients Only)  Admission Status Involuntary  Psychosocial Assessment  Patient Complaints Anxiety;Depression  Eye Contact Glaring  Facial Expression Angry  Affect Appropriate to circumstance  Speech Aggressive  Interaction Defensive;Dominating  Motor Activity Restless  Appearance/Hygiene Unremarkable  Behavior Characteristics Agressive verbally  Mood Labile  Thought Process  Coherency Disorganized  Content Preoccupation  Delusions None reported or observed  Perception Derealization  Hallucination None reported or observed  Judgment Poor  Confusion None  Danger to Self  Current suicidal ideation? Denies  Agreement Not to Harm Self Yes  Description of Agreement Verbal  Danger to Others  Danger to Others None reported or observed

## 2023-12-06 NOTE — Group Note (Signed)
 Date:  12/06/2023 Time:  8:25 PM  Group Topic/Focus:  Wrap-Up Group:   The focus of this group is to help patients review their daily goal of treatment and discuss progress on daily workbooks.    Participation Level:  Did Not Attend  Participation Quality:  none  Affect:  n/a  Cognitive:  n/a  Insight: None  Engagement in Group:  none  Modes of Intervention:  none  Additional Comments:   Pt was encouraged but refused to attend wrap up group  Cameron Katayama A Britnie Colville 12/06/2023, 8:25 PM

## 2023-12-06 NOTE — Plan of Care (Signed)
   Problem: Education: Goal: Knowledge of [facility] Education information/materials will improve Outcome: Progressing Goal: Emotional status will improve Outcome: Progressing Goal: Mental status will improve Outcome: Progressing Goal: Verbalization of understanding the information provided will improve Outcome: Progressing

## 2023-12-06 NOTE — Group Note (Signed)
 Recreation Therapy Group Note   Group Topic:Leisure Education  Group Date: 12/06/2023 Start Time: 1030 End Time: 1100 Facilitators: Kabe Mckoy-McCall, LRT,CTRS Location: [address] Dayroom   Group Topic: Leisure Education   Goal Area(s) Addresses:  Patient will successfully identify positive leisure and recreation activities.  Patient will acknowledge benefits of participation in healthy leisure activities post discharge.  Patient will actively work with peers toward a shared goal.   Behavioral Response:    Intervention: Competitive Group Game    Activity: Keep It Contractor. Patients were spread out throughout the room and were to remain seated at all times. Patients were to hit the beach ball to each other as if playing volleyball. Patients were to keep the ball going for as long as they could. Patients would be timed throughout the activity and if the ball came to a complete stop, the time would start over.    Education:  Teacher, English As A Foreign Language, Leisure as Merchant [employer] Officer, Programmer, Applications, Building Control Surveyor   Education Outcome: Acknowledges education/In group clarification offered/Needs additional education   Clinical Observations/Individualized Feedback: LRT did not conduct group due to lack of staffing. LRT assisted with dayroom.   Plan: Continue to engage patient in RT group sessions 2-3x/week.   Mikel Pyon-McCall, LRT,CTRS 12/06/2023 12:43 PM

## 2023-12-06 NOTE — Group Note (Signed)
 Date:  12/06/2023 Time:  1:43 PM  Group Topic/Focus:  Social Work  Pt. Attended SW Group    Annett Redman Ramsay 12/06/2023, 1:43 PM

## 2023-12-07 MED ORDER — ARIPIPRAZOLE 5 MG PO TABS
5.0000 mg | ORAL_TABLET | Freq: Every day | ORAL | Status: DC
  Administered 2023-12-07 – 2023-12-10 (×4): 5 mg via ORAL
  Filled 2023-12-07 (×5): qty 1

## 2023-12-07 MED ORDER — HALOPERIDOL 5 MG PO TABS
5.0000 mg | ORAL_TABLET | Freq: Two times a day (BID) | ORAL | Status: AC
  Administered 2023-12-07 – 2023-12-08 (×2): 5 mg via ORAL
  Filled 2023-12-07 (×2): qty 1

## 2023-12-07 NOTE — BHH Counselor (Addendum)
 Conversation with patient:  Patient said that she is not interested in substance use inpatient treatment program at this time, however accepted the resources.  She said that she has a history of alcohol  use.  She said she doesn't have any guns or weapons.   Blanka Rockholt, LCSWA 12/06/2023

## 2023-12-07 NOTE — BHH Group Notes (Signed)
 Adult Psychoeducational Group Note  Date:  12/07/2023 Time:  10:56 AM  Group Topic/Focus:  Goals Group:   The focus of this group is to help patients establish daily goals to achieve during treatment and discuss how the patient can incorporate goal setting into their daily lives to aide in recovery. Orientation:   The focus of this group is to educate the patient on the purpose and policies of crisis stabilization and provide a format to answer questions about their admission.  The group details unit policies and expectations of patients while admitted.  Participation Level:  Did Not Attend  Participation Quality:    Affect:    Cognitive:    Insight:   Engagement in Group:    Modes of Intervention:    Additional Comments:    Jamelle Cassondra KIDD 12/07/2023, 10:56 AM

## 2023-12-07 NOTE — BH Assessment (Signed)
(  Sleep Hours) - 11.5 (Any PRNs that were needed, meds refused, or side effects to meds)-  (Any disturbances and when (visitation, over night)- None (Concerns raised by the patient)- None (SI/HI/AVH)- Denies

## 2023-12-07 NOTE — BHH Group Notes (Signed)
 Adult Psychoeducational Group Note  Date:  12/07/2023 Time:  5:25 PM  Group Topic/Focus: Pharmacy  Participation Level:  Did Not Attend  Participation Quality:    Affect:    Cognitive:    Insight:   Engagement in Group:    Modes of Intervention:    Additional Comments:    Alicia Porter 12/07/2023, 5:25 PM

## 2023-12-07 NOTE — Plan of Care (Signed)
   Problem: Education: Goal: Knowledge of [facility] Education information/materials will improve Outcome: Progressing Goal: Verbalization of understanding the information provided will improve Outcome: Progressing   Problem: Activity: Goal: Interest or engagement in activities will improve Outcome: Progressing

## 2023-12-07 NOTE — Group Note (Signed)
 Recreation Therapy Group Note   Group Topic:Coping Skills  Group Date: 12/07/2023 Start Time: 1050 End Time: 1100 Facilitators: Jecenia Leamer-McCall, LRT,CTRS Location: 500 [address] Dayroom   Group Topic: Coping Skills  Goal Area(s) Addresses:  Patient will identify challenges that are holding them back. Patient will identify coping skills that will help them deal with the challenges they are faced with.  Behavioral Response:   Intervention: Pencils, Worksheet  Activity: Get Unstuck. LRT and patients discussed how webs are used by spiders to trap food and other things. Patients were then given a sheet with a blank spider web. Patients were to identify the challenges they are facing and write them inside of the web. Patients were then instructed to write coping skills outside of the web that would help them get free of the things holding them back.    Education: Pharmacologist, Building Control Surveyor.   Education Outcome: Acknowledges understanding/In group clarification offered/Needs additional education.    Affect/Mood: N/A   Participation Level: Did not attend    Clinical Observations/Individualized Feedback:    Plan: Continue to engage patient in RT group sessions 2-3x/week.   Roanne Haye-McCall, LRT,CTRS 12/07/2023 1:21 PM

## 2023-12-07 NOTE — Progress Notes (Signed)
   12/07/23 0820  Psych Admission Type (Psych Patients Only)  Admission Status Involuntary  Psychosocial Assessment  Patient Complaints Depression  Eye Contact Glaring  Facial Expression Flat  Affect Appropriate to circumstance  Speech Aggressive  Interaction Guarded  Motor Activity Restless  Appearance/Hygiene Unremarkable  Behavior Characteristics Irritable  Mood Irritable  Thought Process  Coherency Disorganized  Content Preoccupation  Delusions None reported or observed  Perception Derealization  Hallucination None reported or observed  Judgment Poor  Confusion None  Danger to Self  Current suicidal ideation? Denies  Agreement Not to Harm Self Yes  Description of Agreement verbal  Danger to Others  Danger to Others None reported or observed

## 2023-12-07 NOTE — BHH Group Notes (Signed)
 Adult Psychoeducational Group Note  Date:  12/07/2023 Time:  6:59 PM  Group Topic/Focus: Emotional Wellness  Participation Level:  Did Not Attend  Participation Quality:    Affect:    Cognitive:    Insight:   Engagement in Group:    Modes of Intervention:    Additional Comments:    Alicia Porter O 12/07/2023, 6:59 PM

## 2023-12-07 NOTE — BHH Group Notes (Signed)
 Adult Psychoeducational Group Note  Date:  12/07/2023 Time:  1:23 PM  Group Topic/Focus:  Recreation Therapy \  Participation Level:  Did Not Attend  Participation Quality:    Affect:    Cognitive:    Insight:   Engagement in Group:    Modes of Intervention:    Additional Comments:    Alicia Porter 12/07/2023, 1:23 PM

## 2023-12-07 NOTE — Group Note (Signed)
 Date:  12/07/2023 Time:  11:14 AM  Group Topic/Focus:  Pet Therapy:   This group aims to reduce stress and anxiety by using trained animals as a therapeutic tool.    Participation Level:  Did Not Attend   Alicia Porter 12/07/2023, 11:14 AM
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
## 2023-12-07 NOTE — Plan of Care (Signed)
   Problem: Education: Goal: Knowledge of [facility] Education information/materials will improve Outcome: Progressing Goal: Emotional status will improve Outcome: Progressing Goal: Mental status will improve Outcome: Progressing Goal: Verbalization of understanding the information provided will improve Outcome: Progressing

## 2023-12-07 NOTE — Progress Notes (Signed)
 BH MD Progress Note  Patient Identification: Alicia Porter MRN:  [id number] Date of Evaluation:  12/03/2023 Chief Complaint:  suicidal thoughts Principal Diagnosis: Bipolar 1 disorder, severe, current or most recent episode manic, with psychotic features (HCC) Diagnosis:  Principal Problem:   Bipolar 1 disorder, severe, current or most recent episode manic, with psychotic features (HCC) Active Problems:   Agitation   PTSD (post-traumatic stress disorder)   Psychosis (HCC)   Suicidal thoughts   CC: suicidal thoughts with a plan  Alicia Porter is a 53 y.o. female  with a past psychiatric history of alcohol  use disorder and depression, most recently hospitalized in 2018 for suicidal thoughts.  Collateral information reports a more recent diagnosis of bipolar disorder.  It appears that she completed the [facility] program for alcohol  use issues about 2 months ago.  On the present occasion, she presented to the [facility] behavioral health urgent care reporting suicidal thoughts, accompanied by her longtime friend.  After her initial report of suicidal thoughts, she abruptly refused to speak to anyone.  She is currently admitted to the [facility] behavioral hospital on an involuntary basis.  Last 24 Hours:  No remarkable events.  Patient reports severe restlessness.  Today's Interview:  Patient engages well on interview today.  Less irritability than yesterday.  Reflects on reasons for hospitalization and demonstrates improved insight.  Appropriate sleep and appetite per her report.  Denies thoughts of self-harm or harming others.  She reports a severe sense of restlessness that has started over the past day or two.  She says that she will not take Haldol  on discharge.  She is agreeable to taking Abilify  due to past success on the medication.  She is agreeable to an LAI.   [place] Scale:  Flowsheet Row [facility]  from [facility] from 09/28/2023 in [facility] Department at [facility]  C-SSRS RISK CATEGORY Moderate Risk Error: Question 6 not populated No Risk     Lab Results:  No results found for this or any previous visit (from the past 48 hours).   Blood Alcohol  level:  Lab Results  Component Value Date   [facility] <15 11/30/2023   ETH <5 10/08/2016    Metabolic Disorder Labs:  See assessment and plan.    Psychiatric Specialty Exam: Physical Exam Constitutional:      Appearance: the patient is not toxic-appearing.  Pulmonary:     Effort: Pulmonary effort is normal.  Neurological:     General: No focal deficit present.     Mental Status: the patient is alert and oriented to person, place, and time.   Review of Systems  Respiratory:  Negative for shortness of breath.   Cardiovascular:  Negative for chest pain.  Gastrointestinal:  Negative for abdominal pain, constipation, diarrhea, nausea and vomiting.  Neurological:  Negative for headaches.      BP 135/87 (BP Location: Right Arm)   Pulse 89   Temp 97.8 F (36.6 C) (Oral)   Resp 20   Wt 107.8 kg   SpO2 97%   BMI 38.35 kg/m   General Appearance: Fairly Groomed  Eye Contact:  Good  Speech:  Clear and Coherent  Volume:  Normal  Mood:  Euthymic  Affect:  Congruent  Thought Process:  Coherent  Orientation:  Full (Time, Place, and Person)  Thought Content: Logical   Suicidal Thoughts:  No  Homicidal Thoughts:  No  Memory:  Immediate;   Good  Judgement:  fair  Insight:  fair  Psychomotor Activity:  Normal  Concentration:  Concentration: Good  Recall:  Good  Fund of Knowledge: Good  Language: Good  Akathisia:  No  Handed:  not assessed  AIMS (if indicated): not done  Assets:  Communication Skills Desire for Improvement Financial Resources/Insurance Housing Leisure Time Physical Health  ADL's:  Intact  Cognition: WNL         Treatment Plan  Summary: Daily contact with patient to assess and evaluate symptoms and progress in treatment    Diagnoses / Active Problems:  Principal Problem:   Bipolar 1 disorder, severe, current or most recent episode manic, with psychotic features (HCC) Active Problems:   Agitation   PTSD (post-traumatic stress disorder)   Psychosis (HCC)   Suicidal thoughts   PLAN: Safety and Monitoring:             -- Involuntary admission to inpatient psychiatric unit for safety, stabilization and treatment             -- Daily contact with patient to assess and evaluate symptoms and progress in treatment             -- Patient's case to be discussed in multi-disciplinary team meeting             -- Observation Level : q15 minute checks             -- Vital signs:  q12 hours             -- Precautions: suicide, elopement, and assault   2. Psychiatric Treatment:  -- Reduce Haldol  from 10 mg twice daily to 5 mg twice daily x 1 day, then discontinue - Start Abilify  5 mg daily - Plan for Abilify  Maintena, likely 300 mg before discharge - Continue lamotrigine  100 mg daily  -- Continue trazodone  50 mg once nightly as needed for insomnia  -- Continue hydroxyzine  25 mg 3 times daily as needed for anxiety   --  The risks/benefits/side-effects/alternatives to this medication were discussed in detail with the patient and time was given for questions. The patient consents to medication trial.              -- Metabolic profile and EKG monitoring obtained while on an atypical antipsychotic. See #4 below for values.              -- Encouraged patient to participate in unit milieu and in scheduled group therapies              -- Short Term Goals: Ability to identify changes in lifestyle to reduce recurrence of condition will improve, Ability to verbalize feelings will improve, Ability to disclose and discuss suicidal ideas, Ability to demonstrate self-control will improve, Ability to identify and develop effective  coping behaviors will improve, Ability to maintain clinical measurements within normal limits will improve, Compliance with prescribed medications will improve, and Ability to identify triggers associated with substance abuse/mental health issues will improve             -- Long Term Goals: Improvement in symptoms so as ready for discharge                3. Medical Issues Being Addressed:              -- Hgb A1c 6.0  -- Lipids: elevated LDL - UDS negative, BAL negative, CMP/CBC unremarkable    -- Continue  PRN's: Tylenol , Maalox, Milk of Magnesia  CPAP for OSA   4. Routine and other pertinent labs reviewed: EKG monitoring: QTc: Not obtained.  Last EKG from 2018 within normal limits.  BMI: Body mass index is 38.35 kg/m. Prolactin: Lab Results  Component Value Date   PROLACTIN 9.9 11/30/2023   PROLACTIN 42.8 (H) 10/12/2016    Lipid Panel: Lab Results  Component Value Date   CHOL 213 (H) 01/25/2018   TRIG 124 01/25/2018   HDL 38 (L) 01/25/2018   CHOLHDL 5.6 (H) 01/25/2018   VLDL 27 10/12/2016   LDLCALC 150 (H) 01/25/2018   LDLCALC 96 10/12/2016    HbgA1c: Hgb A1c MFr Bld (%)  Date Value  01/25/2018 5.8 (H)    TSH: TSH (uIU/mL)  Date Value  11/30/2023 1.155  01/25/2018 2.040     5. Discharge Planning:              -- Social work and case management to assist with discharge planning and identification of hospital follow-up needs prior to discharge             -- Discharge Concerns: Need to establish a safety plan; Medication compliance and effectiveness             -- Discharge Goals: Return home with outpatient referrals for mental health follow-up including medication management/psychotherapy     Karleen Kaufmann, MD PGY-4

## 2023-12-08 ENCOUNTER — Encounter (HOSPITAL_COMMUNITY): Payer: Self-pay

## 2023-12-08 ENCOUNTER — Telehealth (HOSPITAL_COMMUNITY): Payer: Self-pay | Admitting: Pharmacy Technician

## 2023-12-08 ENCOUNTER — Other Ambulatory Visit: Payer: Self-pay | Admitting: Medical Genetics

## 2023-12-08 ENCOUNTER — Other Ambulatory Visit (HOSPITAL_COMMUNITY): Payer: Self-pay

## 2023-12-08 DIAGNOSIS — Z006 Encounter for examination for normal comparison and control in clinical research program: Secondary | ICD-10-CM

## 2023-12-08 MED ORDER — ARIPIPRAZOLE ER 400 MG IM SRER
300.0000 mg | INTRAMUSCULAR | Status: DC
  Administered 2023-12-09: 300 mg via INTRAMUSCULAR

## 2023-12-08 NOTE — Group Note (Signed)
 Date:  12/08/2023 Time:  8:54 PM  Group Topic/Focus:  Wrap-Up Group:   The focus of this group is to help patients review their daily goal of treatment and discuss progress on daily workbooks.    Participation Level:  Active  Participation Quality:  Appropriate  Affect:  Appropriate  Cognitive:  Appropriate  Insight: Appropriate  Engagement in Group:  Engaged  Modes of Intervention:  Education and Exploration  Additional Comments:  Patient attended and participated in group tonight. She reports that today she catch up on sleep and also stayed off her feet which hurts.  Gwenn Chillington Dacosta 12/08/2023, 8:54 PM

## 2023-12-08 NOTE — Telephone Encounter (Signed)
 Patient Product/process Development Scientist completed.    The patient is insured through [employer].     Ran test claim for Abifily Maintena 400 mg Prsy and the current 30 day co-pay is $4.00.   This test claim was processed through [facility]- copay amounts may vary at other pharmacies due to pharmacy/plan contracts, or as the patient moves through the different stages of their insurance plan.     Reyes Sharps, CPHT Pharmacy Technician Patient Advocate Specialist Lead [facility] Patient Advocate Team Direct Number: [phone]  Fax: [phone]

## 2023-12-08 NOTE — BHH Group Notes (Signed)
 Adult Psychoeducational Group Note  Date:  12/08/2023 Time:  4:53 PM  Group Topic/Focus:  Emotional Education:   The focus of this group is to discuss what feelings/emotions are, and how they are experienced.  Participation Level:  Did Not Attend  Participation Quality:    Affect:    Cognitive:    Insight:   Engagement in Group:    Modes of Intervention:    Additional Comments:    Jamelle Cassondra KIDD 12/08/2023, 4:53 PM

## 2023-12-08 NOTE — Group Note (Signed)
 Recreation Therapy Group Note   Group Topic:Relaxation  Group Date: 12/08/2023 Start Time: 1020 End Time: 1035 Facilitators: Pj Zehner-McCall, LRT,CTRS Location: [address] Dayroom   Group Topic/Focus: Music Therapy  Goal Area(s) Addresses:  Patient will express the importance of music in self care.  Patient will demonstrate appropriate behavior while listening to the music.  Behavioral Response:   Activity: Patients were given the opportunity to request the songs of their choosing as long as they were clean and appropriate. Patients were to also share what that song means to them.  Intervention: Music   Education: Leisure exposure, Coping skills, Musical expression, Discharge Planning  Affect/Mood: N/A   Participation Level: Did not attend    Clinical Observations/Individualized Feedback:     Plan: Continue to engage patient in RT group sessions 2-3x/week.   Suresh Audi-McCall, LRT,CTRS  12/08/2023 12:14 PM

## 2023-12-08 NOTE — Progress Notes (Signed)
 BH MD Progress Note  Patient Identification: Alicia Porter MRN:  [id number] Date of Evaluation:  12/03/2023 Chief Complaint:  suicidal thoughts Principal Diagnosis: Bipolar 1 disorder, severe, current or most recent episode manic, with psychotic features (HCC) Diagnosis:  Principal Problem:   Bipolar 1 disorder, severe, current or most recent episode manic, with psychotic features (HCC) Active Problems:   Agitation   PTSD (post-traumatic stress disorder)   Psychosis (HCC)   Suicidal thoughts   CC: suicidal thoughts with a plan  Alicia Porter is a 53 y.o. female  with a past psychiatric history of alcohol  use disorder and depression, most recently hospitalized in 2018 for suicidal thoughts.  Collateral information reports a more recent diagnosis of bipolar disorder.  It appears that she completed the [facility] IOP program for alcohol  use issues about 2 months ago.  On the present occasion, she presented to the [facility] behavioral health urgent care reporting suicidal thoughts, accompanied by her longtime friend.  After her initial report of suicidal thoughts, she abruptly refused to speak to anyone.  She is currently admitted to the [facility] behavioral hospital on an involuntary basis.  Last 24 Hours:  No remarkable events.  Patient reports severe restlessness.  Today's Interview:  Patient engages well on interview today.  She reports a significant reduction in restlessness with the decreased dose of Haldol .  She notes no side effects from the Abilify .  She reports good sleep, able to use her CPAP.  She continues to report chronic pain at various sites.  She denies experiencing hallucinations or thoughts of self-harm/suicide.  She discusses rational plans for the future that involve getting my mental health back in order, saying that she plans to go to AA groups.   Talked with the patient's roommate, Alicia Porter.  She is happy to hear the patient is doing significantly better.  She is  comfortable with picking the patient up on Friday morning.  She has pertinent questions about follow-up plans.   [place] Scale:  Flowsheet Row [facility]) [facility] from 11/30/2023 in [facility] ED from 09/28/2023 in [facility] Emergency Department at [facility]  C-SSRS RISK CATEGORY Moderate Risk Error: Question 6 not populated No Risk     Lab Results:  No results found for this or any previous visit (from the past 48 hours).   Blood Alcohol  level:  Lab Results  Component Value Date   [facility] <15 11/30/2023   ETH <5 10/08/2016    Metabolic Disorder Labs:  See assessment and plan.    Psychiatric Specialty Exam: Physical Exam Constitutional:      Appearance: the patient is not toxic-appearing.  Pulmonary:     Effort: Pulmonary effort is normal.  Neurological:     General: No focal deficit present.     Mental Status: the patient is alert and oriented to person, place, and time.   Review of Systems  Respiratory:  Negative for shortness of breath.   Cardiovascular:  Negative for chest pain.  Gastrointestinal:  Negative for abdominal pain, constipation, diarrhea, nausea and vomiting.  Neurological:  Negative for headaches.      BP (!) 142/82 (BP Location: Left Arm)   Pulse 87   Temp 97.8 F (36.6 C) (Oral)   Resp 19   Wt 107.8 kg   SpO2 99%   BMI 38.35 kg/m   General Appearance: Fairly Groomed  Eye Contact:  Good  Speech:  Clear and  Coherent  Volume:  Normal  Mood:  Euthymic  Affect:  Congruent  Thought Process:  Coherent  Orientation:  Full (Time, Place, and Person)  Thought Content: Logical   Suicidal Thoughts:  No  Homicidal Thoughts:  No  Memory:  Immediate;   Good  Judgement:  fair  Insight:  fair  Psychomotor Activity:  Normal  Concentration:  Concentration: Good  Recall:  Good  Fund of Knowledge: Good  Language: Good  Akathisia:  No  Handed:   not assessed  AIMS (if indicated): not done  Assets:  Communication Skills Desire for Improvement Financial Resources/Insurance Housing Leisure Time Physical Health  ADL's:  Intact  Cognition: WNL         Treatment Plan Summary: Daily contact with patient to assess and evaluate symptoms and progress in treatment    Diagnoses / Active Problems:  Principal Problem:   Bipolar 1 disorder, severe, current or most recent episode manic, with psychotic features (HCC) Active Problems:   Agitation   PTSD (post-traumatic stress disorder)   Psychosis (HCC)   Suicidal thoughts   PLAN: Safety and Monitoring:             -- Involuntary admission to inpatient psychiatric unit for safety, stabilization and treatment             -- Daily contact with patient to assess and evaluate symptoms and progress in treatment             -- Patient's case to be discussed in multi-disciplinary team meeting             -- Observation Level : q15 minute checks             -- Vital signs:  q12 hours             -- Precautions: suicide, elopement, and assault   2. Psychiatric Treatment:  -- Continue Abilify  5 mg daily - Start Abilify  Maintena 300 mg for 10/30, discussed with the patient - Continue lamotrigine  100 mg daily  -- Continue trazodone  50 mg once nightly as needed for insomnia  -- Continue hydroxyzine  25 mg 3 times daily as needed for anxiety   --  The risks/benefits/side-effects/alternatives to this medication were discussed in detail with the patient and time was given for questions. The patient consents to medication trial.              -- Metabolic profile and EKG monitoring obtained while on an atypical antipsychotic. See #4 below for values.              -- Encouraged patient to participate in unit milieu and in scheduled group therapies              -- Short Term Goals: Ability to identify changes in lifestyle to reduce recurrence of condition will improve, Ability to verbalize  feelings will improve, Ability to disclose and discuss suicidal ideas, Ability to demonstrate self-control will improve, Ability to identify and develop effective coping behaviors will improve, Ability to maintain clinical measurements within normal limits will improve, Compliance with prescribed medications will improve, and Ability to identify triggers associated with substance abuse/mental health issues will improve             -- Long Term Goals: Improvement in symptoms so as ready for discharge                3. Medical Issues Being Addressed:              --  Hgb A1c 6.0  -- Lipids: elevated LDL - UDS negative, BAL negative, CMP/CBC unremarkable    -- Continue PRN's: Tylenol , Maalox, Milk of Magnesia  CPAP for OSA   4. Routine and other pertinent labs reviewed: EKG monitoring: QTc: Not obtained.  Last EKG from 2018 within normal limits.  BMI: Body mass index is 38.35 kg/m. Prolactin: Lab Results  Component Value Date   PROLACTIN 9.9 11/30/2023   PROLACTIN 42.8 (H) 10/12/2016    Lipid Panel: Lab Results  Component Value Date   CHOL 213 (H) 01/25/2018   TRIG 124 01/25/2018   HDL 38 (L) 01/25/2018   CHOLHDL 5.6 (H) 01/25/2018   VLDL 27 10/12/2016   LDLCALC 150 (H) 01/25/2018   LDLCALC 96 10/12/2016    HbgA1c: Hgb A1c MFr Bld (%)  Date Value  01/25/2018 5.8 (H)    TSH: TSH (uIU/mL)  Date Value  11/30/2023 1.155  01/25/2018 2.040     5. Discharge Planning:              -- Social work and case management to assist with discharge planning and identification of hospital follow-up needs prior to discharge             -- Discharge Concerns: Need to establish a safety plan; Medication compliance and effectiveness             -- Discharge Goals: Return home with outpatient referrals for mental health follow-up including medication management/psychotherapy     Karleen Kaufmann, MD PGY-4

## 2023-12-08 NOTE — BHH Group Notes (Signed)
 Adult Psychoeducational Group Note  Date:  12/08/2023 Time:  7:59 AM  Group Topic/Focus: Emotional Wellness  Participation Level:  Active  Participation Quality:  Appropriate  Affect:  Appropriate  Cognitive:  Appropriate  Insight: Appropriate  Engagement in Group:  Engaged  Modes of Intervention:  Discussion  Additional Comments: Pt attended Emotional Wellness group and participated.   Annett Redman Ramsay 12/08/2023, 7:59 AM

## 2023-12-08 NOTE — Group Note (Signed)
 Date:  12/08/2023 Time:  3:37 PM  Group Topic/Focus: Social Wellness Emotional Education:   The focus of this group is to discuss what feelings/emotions are, and how they are experienced. Explained how to magnae social anxiety and breathing techniques.     Participation Level:  Did Not Attend    Alicia Porter 12/08/2023, 3:37 PM

## 2023-12-08 NOTE — BHH Group Notes (Signed)
 Adult Psychoeducational Group Note  Date:  12/08/2023 Time:  1:54 PM  Group Topic/Focus: Recreation therapy  Participation Level:  Did not attend  Participation Quality:    Affect:    Cognitive:    Insight:   Engagement in Group:    Modes of Intervention:    Additional Comments:    Annett Berle Hoyer 12/08/2023, 1:54 PM

## 2023-12-08 NOTE — BHH Group Notes (Signed)
 Adult Psychoeducational Group Note  Date:  12/08/2023 Time:  4:54 PM  Group Topic/Focus: Physical Wellness  Participation Level:  Did Not Attend  Participation Quality:    Affect:    Cognitive:    Insight:   Engagement in Group:    Modes of Intervention:    Additional Comments:    Alicia Porter 12/08/2023, 4:54 PM

## 2023-12-08 NOTE — Progress Notes (Signed)
 00  12/08/23 0800  Psych Admission Type (Psych Patients Only)  Admission Status Involuntary  Psychosocial Assessment  Patient Complaints Anxiety  Eye Contact Fair  Facial Expression Flat  Affect Appropriate to circumstance  Speech Logical/coherent  Interaction Assertive  Motor Activity Restless  Appearance/Hygiene Unremarkable  Behavior Characteristics Cooperative;Appropriate to situation  Mood Anxious  Thought Process  Coherency Disorganized  Content Preoccupation  Delusions None reported or observed  Perception Depersonalization  Hallucination None reported or observed  Judgment Poor  Confusion None  Danger to Self  Current suicidal ideation? Denies  Agreement Not to Harm Self Yes  Description of Agreement verbal  Danger to Others  Danger to Others None reported or observed  Danger to Others Abnormal  Harmful Behavior to others No threats or harm toward other people

## 2023-12-08 NOTE — Plan of Care (Signed)
  Problem: Education: Goal: Knowledge of [place] [facility] Education information/materials will improve Outcome: Progressing Goal: Emotional status will improve Outcome: Progressing Goal: Mental status will improve Outcome: Progressing Goal: Verbalization of understanding the information provided will improve Outcome: Progressing   Problem: Activity: Goal: Interest or engagement in activities will improve Outcome: Progressing Goal: Sleeping patterns will improve Outcome: Progressing   Problem: Coping: Goal: Ability to verbalize frustrations and anger appropriately will improve Outcome: Progressing Goal: Ability to demonstrate self-control will improve Outcome: Progressing   Problem: Health Behavior/Discharge Planning: Goal: Identification of resources available to assist in meeting health care needs will improve Outcome: Progressing Goal: Compliance with treatment plan for underlying cause of condition will improve Outcome: Progressing   Problem: Physical Regulation: Goal: Ability to maintain clinical measurements within normal limits will improve Outcome: Progressing   Problem: Safety: Goal: Periods of time without injury will increase Outcome: Progressing   Problem: Education: Goal: Knowledge of [place] [facility] Education information/materials will improve Outcome: Progressing Goal: Emotional status will improve Outcome: Progressing Goal: Mental status will improve Outcome: Progressing Goal: Verbalization of understanding the information provided will improve Outcome: Progressing   Problem: Activity: Goal: Interest or engagement in activities will improve Outcome: Progressing Goal: Sleeping patterns will improve Outcome: Progressing   Problem: Coping: Goal: Ability to verbalize frustrations and anger appropriately will improve Outcome: Progressing Goal: Ability to demonstrate self-control will improve Outcome: Progressing   Problem: Health  Behavior/Discharge Planning: Goal: Identification of resources available to assist in meeting health care needs will improve Outcome: Progressing Goal: Compliance with treatment plan for underlying cause of condition will improve Outcome: Progressing   Problem: Physical Regulation: Goal: Ability to maintain clinical measurements within normal limits will improve Outcome: Progressing   Problem: Safety: Goal: Periods of time without injury will increase Outcome: Progressing   Problem: Activity: Goal: Will identify at least one activity in which they can participate Outcome: Progressing   Problem: Coping: Goal: Ability to identify and develop effective coping behavior will improve Outcome: Progressing Goal: Ability to interact with others will improve Outcome: Progressing Goal: Demonstration of participation in decision-making regarding own care will improve Outcome: Progressing Goal: Ability to use eye contact when communicating with others will improve Outcome: Progressing   Problem: Health Behavior/Discharge Planning: Goal: Identification of resources available to assist in meeting health care needs will improve Outcome: Progressing   Problem: Self-Concept: Goal: Will verbalize positive feelings about self Outcome: Progressing   Problem: Education: Goal: Ability to make informed decisions regarding treatment will improve Outcome: Progressing   Problem: Coping: Goal: Coping ability will improve Outcome: Progressing   Problem: Health Behavior/Discharge Planning: Goal: Identification of resources available to assist in meeting health care needs will improve Outcome: Progressing   Problem: Medication: Goal: Compliance with prescribed medication regimen will improve Outcome: Progressing   Problem: Self-Concept: Goal: Ability to disclose and discuss suicidal ideas will improve Outcome: Progressing Goal: Will verbalize positive feelings about self Outcome:  Progressing Note: Patient is on track. Patient will maintain adherence

## 2023-12-08 NOTE — Plan of Care (Signed)
  Problem: Education: Goal: Emotional status will improve Outcome: Progressing Goal: Mental status will improve Outcome: Progressing   Problem: Coping: Goal: Ability to demonstrate self-control will improve Outcome: Progressing

## 2023-12-08 NOTE — BH IP Treatment Plan (Signed)
 Interdisciplinary Treatment and Diagnostic Plan Update  12/08/2023 Time of Session: 12:20 PM - UPDATE Alicia Porter MRN: [id number]  Principal Diagnosis: Bipolar 1 disorder, severe, current or most recent episode manic, with psychotic features (HCC)  Secondary Diagnoses: Principal Problem:   Bipolar 1 disorder, severe, current or most recent episode manic, with psychotic features (HCC) Active Problems:   Agitation   PTSD (post-traumatic stress disorder)   Psychosis (HCC)   Suicidal thoughts   Current Medications:  Current Facility-Administered Medications  Medication Dose Route Frequency Provider Last Rate Last Admin   acetaminophen  (TYLENOL ) tablet 650 mg  650 mg Oral Q6H PRN Tex Drilling, NP   650 mg at 12/07/23 1231   alum & mag hydroxide-simeth (MAALOX/MYLANTA) 200-200-20 MG/5ML suspension 30 mL  30 mL Oral Q4H PRN Tex Drilling, NP       ARIPiprazole  (ABILIFY ) tablet 5 mg  5 mg Oral Daily Marry Clamp, MD   5 mg at 12/08/23 0801   [START ON 12/09/2023] ARIPiprazole  ER (ABILIFY  MAINTENA) injection 300 mg  300 mg Intramuscular Q28 days Marry Clamp, MD       cetirizine (ZYRTEC) tablet 10 mg  10 mg Oral QHS Nkwenti, Doris, NP   10 mg at 12/07/23 2112   haloperidol  (HALDOL ) tablet 5 mg  5 mg Oral TID PRN Tex Drilling, NP       And   diphenhydrAMINE (BENADRYL) capsule 50 mg  50 mg Oral TID PRN Tex Drilling, NP       haloperidol  (HALDOL ) tablet 5 mg  5 mg Oral TID PRN Onuoha, Chinwendu V, NP       And   diphenhydrAMINE (BENADRYL) capsule 50 mg  50 mg Oral TID PRN Onuoha, Chinwendu V, NP       haloperidol  lactate (HALDOL ) injection 5 mg  5 mg Intramuscular TID PRN Tex Drilling, NP       And   diphenhydrAMINE (BENADRYL) injection 50 mg  50 mg Intramuscular TID PRN Tex Drilling, NP       And   LORazepam  (ATIVAN ) injection 2 mg  2 mg Intramuscular TID PRN Tex Drilling, NP       haloperidol  lactate (HALDOL ) injection 10 mg  10 mg Intramuscular TID PRN Tex Drilling, NP       And   diphenhydrAMINE (BENADRYL) injection 50 mg  50 mg Intramuscular TID PRN Tex Drilling, NP       And   LORazepam  (ATIVAN ) injection 2 mg  2 mg Intramuscular TID PRN Tex Drilling, NP       haloperidol  lactate (HALDOL ) injection 5 mg  5 mg Intramuscular TID PRN Onuoha, Chinwendu V, NP       And   diphenhydrAMINE (BENADRYL) injection 50 mg  50 mg Intramuscular TID PRN Onuoha, Chinwendu V, NP       And   LORazepam  (ATIVAN ) injection 2 mg  2 mg Intramuscular TID PRN Onuoha, Chinwendu V, NP       haloperidol  lactate (HALDOL ) injection 10 mg  10 mg Intramuscular TID PRN Onuoha, Chinwendu V, NP       And   diphenhydrAMINE (BENADRYL) injection 50 mg  50 mg Intramuscular TID PRN Onuoha, Chinwendu V, NP       And   LORazepam  (ATIVAN ) injection 2 mg  2 mg Intramuscular TID PRN Onuoha, Chinwendu V, NP       diphenhydrAMINE (BENADRYL) injection 50 mg  50 mg Intramuscular UD Chandra Charleston Christian Lee, DO   50 mg at 12/03/23 [phone]  hydrOXYzine  (ATARAX ) tablet 25 mg  25 mg Oral TID PRN Tex Drilling, NP   25 mg at 12/08/23 1810   lamoTRIgine  (LAMICTAL  XR) 24 hour tablet 100 mg  100 mg Oral Daily Marry Clamp, MD   100 mg at 12/08/23 0800   magnesium  hydroxide (MILK OF MAGNESIA) suspension 30 mL  30 mL Oral Daily PRN Tex Drilling, NP       traZODone  (DESYREL ) tablet 50 mg  50 mg Oral QHS PRN Tex Drilling, NP   50 mg at 12/07/23 2112   PTA Medications: Medications Prior to Admission  Medication Sig Dispense Refill Last Dose/Taking   cetirizine (ZYRTEC) 10 MG tablet Take 10 mg by mouth daily.      EPINEPHrine 0.3 mg/0.3 mL IJ SOAJ injection Inject 0.3 mg into the muscle.      lamoTRIgine  (LAMICTAL  XR) 100 MG 24 hour tablet Take 1 tablet (100 mg total) by mouth daily. (Patient taking differently: Take 100 mg by mouth at bedtime.) 30 tablet 2    sulfamethoxazole-trimethoprim (BACTRIM DS) 800-160 MG tablet Take 1 tablet by mouth 2 (two) times daily. 3 more doses       Patient  Stressors: Health problems   Medication change or noncompliance    Patient Strengths: Capable of independent living  Communication skills  Supportive family/friends   Treatment Modalities: Medication Management, Group therapy, Case management,  1 to 1 session with clinician, Psychoeducation, Recreational therapy.   Physician Treatment Plan for Primary Diagnosis: Bipolar 1 disorder, severe, current or most recent episode manic, with psychotic features (HCC) Long Term Goal(s):     Short Term Goals:    Medication Management: Evaluate patient's response, side effects, and tolerance of medication regimen.  Therapeutic Interventions: 1 to 1 sessions, Unit Group sessions and Medication administration.  Evaluation of Outcomes: Progressing  Physician Treatment Plan for Secondary Diagnosis: Principal Problem:   Bipolar 1 disorder, severe, current or most recent episode manic, with psychotic features (HCC) Active Problems:   Agitation   PTSD (post-traumatic stress disorder)   Psychosis (HCC)   Suicidal thoughts  Long Term Goal(s):     Short Term Goals:       Medication Management: Evaluate patient's response, side effects, and tolerance of medication regimen.  Therapeutic Interventions: 1 to 1 sessions, Unit Group sessions and Medication administration.  Evaluation of Outcomes: Progressing   RN Treatment Plan for Primary Diagnosis: Bipolar 1 disorder, severe, current or most recent episode manic, with psychotic features (HCC) Long Term Goal(s): Knowledge of disease and therapeutic regimen to maintain health will improve  Short Term Goals: Ability to remain free from injury will improve, Ability to verbalize frustration and anger appropriately will improve, Ability to verbalize feelings will improve, and Ability to disclose and discuss suicidal ideas  Medication Management: RN will administer medications as ordered by provider, will assess and evaluate patient's response and provide  education to patient for prescribed medication. RN will report any adverse and/or side effects to prescribing provider.  Therapeutic Interventions: 1 on 1 counseling sessions, Psychoeducation, Medication administration, Evaluate responses to treatment, Monitor vital signs and CBGs as ordered, Perform/monitor CIWA, COWS, AIMS and Fall Risk screenings as ordered, Perform wound care treatments as ordered.  Evaluation of Outcomes: Progressing   LCSW Treatment Plan for Primary Diagnosis: Bipolar 1 disorder, severe, current or most recent episode manic, with psychotic features (HCC) Long Term Goal(s): Safe transition to appropriate next level of care at discharge, Engage patient in therapeutic group addressing interpersonal concerns.  Short Term Goals:  Engage patient in aftercare planning with referrals and resources, Increase ability to appropriately verbalize feelings, Facilitate acceptance of mental health diagnosis and concerns, and Identify triggers associated with mental health/substance abuse issues  Therapeutic Interventions: Assess for all discharge needs, 1 to 1 time with Social worker, Explore available resources and support systems, Assess for adequacy in community support network, Educate family and significant other(s) on suicide prevention, Complete Psychosocial Assessment, Interpersonal group therapy.  Evaluation of Outcomes: Progressing   Progress in Treatment: Attending groups: attended some groups Participating in groups: Yes. Taking medication as prescribed: Yes. Toleration medication: Yes. Family/Significant other contact made:  Yes, contacted Regina Zanin (roommate) [phone] Patient understands diagnosis: No. Discussing patient identified problems/goals with staff: No. Medical problems stabilized or resolved: Yes. Denies suicidal/homicidal ideation: Yes. Issues/concerns per patient self-inventory: No.   New problem(s) identified:  No   New Short Term/Long Term  Goal(s):     medication stabilization, elimination of SI thoughts, development of comprehensive mental wellness plan.      Patient Goals:  You have nothing to offer.   Discharge Plan or Barriers:  Patient recently admitted. CSW will continue to follow and assess for appropriate referrals and possible discharge planning.      medication stabilization, elimination of SI thoughts, development of comprehensive mental wellness plan.      Reason for Continuation of Hospitalization: Anxiety Depression Medication stabilization Suicidal ideation   Estimated Length of Stay:  4 - 6 days  Last 3 [place] Suicide Severity Risk Score: Flowsheet Row [facility] (Current) [facility]/30/[facility] in [facility] ED from 09/28/2023 in [facility] Department at [facility]  C-SSRS RISK CATEGORY Moderate Risk Error: Question 6 not populated No Risk    Last PHQ 2/9 Scores:    08/02/2023    4:32 PM 02/15/2018    4:30 PM  Depression screen PHQ 2/9  Decreased Interest 0 0  Down, Depressed, Hopeless 0 0  PHQ - 2 Score 0 0  Altered sleeping 3   Tired, decreased energy 3   Change in appetite 3   Feeling bad or failure about yourself  1   Trouble concentrating 2   Moving slowly or fidgety/restless 0   Suicidal thoughts 0   PHQ-9 Score 12     Scribe for Treatment Team: Calaya Gildner O Yaniah Thiemann, LCSWA 12/08/2023 6:27 PM

## 2023-12-08 NOTE — Progress Notes (Signed)
(  Sleep Hours) - 10.25 (Any PRNs that were needed, meds refused, or side effects to meds)-trazodone  (Any disturbances and when (visitation, over night) (Concerns raised by the patient)- none (SI/HI/AVH)-denies

## 2023-12-09 MED ORDER — MELATONIN 5 MG PO TABS
5.0000 mg | ORAL_TABLET | Freq: Once | ORAL | Status: AC
  Administered 2023-12-09: 5 mg via ORAL
  Filled 2023-12-09: qty 1

## 2023-12-09 NOTE — Group Note (Signed)
 Recreation Therapy Group Note   Group Topic:Personal Development  Group Date: 12/09/2023 Start Time: 1005 End Time: 1030 Facilitators: Aman Bonet-McCall, LRT,CTRS Location: 500 Hall Dayroom   Group Topic: Triggers  Goal Area(s) Addresses:  Patient will identify their biggest triggers.   Patient will identify how they avoid their triggers.  Patient will identify how they deal with triggers head on.   Behavioral Response:   Intervention: Worksheets  Activity: LRT attempted to discuss triggers with patients. Patients identified the things that trigger them. Patients would then identify how they would limit their exposure to their triggers and then identify how they would deal with them head on.   Education: Triggers, Discharge Planning   Education Outcome: Acknowledges education/In group clarification offered/Needs additional education.    Affect/Mood: N/A   Participation Level: Did not attend    Clinical Observations/Individualized Feedback:      Plan: Continue to engage patient in RT group sessions 2-3x/week.   Adama Ferber-McCall, LRT,CTRS 12/09/2023 11:29 AM

## 2023-12-09 NOTE — BHH Group Notes (Signed)
 BHH Assessment Progress Note          Pt did not attend group

## 2023-12-09 NOTE — Progress Notes (Signed)
(  Sleep Hours) -9.25 (Any PRNs that were needed, meds refused, or side effects to meds)- Trazodone  and Tylenol .  (Any disturbances and when (visitation, over night)-none (Concerns raised by the patient)- none (SI/HI/AVH)-denied

## 2023-12-09 NOTE — Progress Notes (Signed)
 Pt came to NS and reported feeling anxious and mildly agitated, both 6/10. Pt states I need something that will knock me out I need to nap for 2 hours pt initially requested lorazepam  but pt was informed she did not have a PRN lorazepam  for mild agitation. Pt stated she would take hydroxyzine  instead. Pt given PRN per MAR. Pt remains safe on the unit.

## 2023-12-09 NOTE — Progress Notes (Signed)
 BH MD Progress Note  Patient Identification: Alicia Porter MRN:  [id number] Date of Evaluation:  12/03/2023 Chief Complaint:  suicidal thoughts Principal Diagnosis: Bipolar 1 disorder, severe, current or most recent episode manic, with psychotic features (HCC) Diagnosis:  Principal Problem:   Bipolar 1 disorder, severe, current or most recent episode manic, with psychotic features (HCC) Active Problems:   Agitation   PTSD (post-traumatic stress disorder)   Psychosis (HCC)   Suicidal thoughts   CC: suicidal thoughts with a plan  FRANCY Porter is a 53 y.o. female  with a past psychiatric history of alcohol  use disorder and depression, most recently hospitalized in 2018 for suicidal thoughts.  Collateral information reports a more recent diagnosis of bipolar disorder.  It appears that she completed the [facility] IOP program for alcohol  use issues about 2 months ago.  On the present occasion, she presented to the [facility] behavioral health urgent care reporting suicidal thoughts, accompanied by her longtime friend.  After her initial report of suicidal thoughts, she abruptly refused to speak to anyone.  She is currently admitted to the [facility] behavioral hospital on an involuntary basis.  Last 24 Hours:  No remarkable events.  Patient reports severe restlessness.  Today's Interview:  Patient does well on interview today.  She tolerated the Abilify  Maintena shot well.  She asks relevant questions about the cost of the medication and where she will follow-up.  These questions were answered.  She complains of chronic toe pain.  She denies experiencing issues with sleep or appetite.  Denies hallucinations or thoughts of self-harm.   Columbia Scale:  Flowsheet Row [facility] from 11/30/2023 in [facility] ED from 09/28/2023 in [facility] Emergency Department at [facility]   C-SSRS RISK CATEGORY Moderate Risk Error: Question 6 not populated No Risk     Lab Results:  No results found for this or any previous visit (from the past 48 hours).   Blood Alcohol  level:  Lab Results  Component Value Date   [facility] <15 11/30/2023   ETH <5 10/08/2016    Metabolic Disorder Labs:  See assessment and plan.    Psychiatric Specialty Exam: Physical Exam Constitutional:      Appearance: the patient is not toxic-appearing.  Pulmonary:     Effort: Pulmonary effort is normal.  Neurological:     General: No focal deficit present.     Mental Status: the patient is alert and oriented to person, place, and time.   Review of Systems  Respiratory:  Negative for shortness of breath.   Cardiovascular:  Negative for chest pain.  Gastrointestinal:  Negative for abdominal pain, constipation, diarrhea, nausea and vomiting.  Neurological:  Negative for headaches.      BP 120/80 (BP Location: Right Arm)   Pulse 93   Temp 97.8 F (36.6 C) (Oral)   Resp 19   Wt 107.8 kg   SpO2 97%   BMI 38.35 kg/m   General Appearance: Fairly Groomed  Eye Contact:  Good  Speech:  Clear and Coherent  Volume:  Normal  Mood:  Euthymic  Affect:  Congruent  Thought Process:  Coherent  Orientation:  Full (Time, Place, and Person)  Thought Content: Logical   Suicidal Thoughts:  No  Homicidal Thoughts:  No  Memory:  Immediate;   Good  Judgement:  fair  Insight:  fair  Psychomotor Activity:  Normal  Concentration:  Concentration: Good  Recall:  Good  Fund of Knowledge: Good  Language: Good  Akathisia:  No  Handed:  not assessed  AIMS (if indicated): not done  Assets:  Communication Skills Desire for Improvement Financial Resources/Insurance Housing Leisure Time Physical Health  ADL's:  Intact  Cognition: WNL         Treatment Plan Summary: Daily contact with patient to assess and evaluate symptoms and progress in treatment    Diagnoses / Active Problems:  Principal  Problem:   Bipolar 1 disorder, severe, current or most recent episode manic, with psychotic features (HCC) Active Problems:   Agitation   PTSD (post-traumatic stress disorder)   Psychosis (HCC)   Suicidal thoughts   PLAN: Safety and Monitoring:             -- Involuntary admission to inpatient psychiatric unit for safety, stabilization and treatment             -- Daily contact with patient to assess and evaluate symptoms and progress in treatment             -- Patient's case to be discussed in multi-disciplinary team meeting             -- Observation Level : q15 minute checks             -- Vital signs:  q12 hours             -- Precautions: suicide, elopement, and assault   2. Psychiatric Treatment:  -- Continue Abilify  5 mg daily - Abilify  Maintena 300 mg on 10/30, discussed follow-up - Continue lamotrigine  100 mg daily  -- Continue trazodone  50 mg once nightly as needed for insomnia  -- Continue hydroxyzine  25 mg 3 times daily as needed for anxiety   --  The risks/benefits/side-effects/alternatives to this medication were discussed in detail with the patient and time was given for questions. The patient consents to medication trial.              -- Metabolic profile and EKG monitoring obtained while on an atypical antipsychotic. See #4 below for values.              -- Encouraged patient to participate in unit milieu and in scheduled group therapies              -- Short Term Goals: Ability to identify changes in lifestyle to reduce recurrence of condition will improve, Ability to verbalize feelings will improve, Ability to disclose and discuss suicidal ideas, Ability to demonstrate self-control will improve, Ability to identify and develop effective coping behaviors will improve, Ability to maintain clinical measurements within normal limits will improve, Compliance with prescribed medications will improve, and Ability to identify triggers associated with substance abuse/mental  health issues will improve             -- Long Term Goals: Improvement in symptoms so as ready for discharge                3. Medical Issues Being Addressed:              -- Hgb A1c 6.0  -- Lipids: elevated LDL - UDS negative, BAL negative, CMP/CBC unremarkable    -- Continue PRN's: Tylenol , Maalox, Milk of Magnesia  CPAP for OSA   4. Routine and other pertinent labs reviewed: EKG monitoring: QTc: Not obtained.  Last EKG from 2018 within normal limits.  BMI: Body mass index is 38.35 kg/m. Prolactin: Lab Results  Component Value  Date   PROLACTIN 9.9 11/30/2023   PROLACTIN 42.8 (H) 10/12/2016    Lipid Panel: Lab Results  Component Value Date   CHOL 213 (H) 01/25/2018   TRIG 124 01/25/2018   HDL 38 (L) 01/25/2018   CHOLHDL 5.6 (H) 01/25/2018   VLDL 27 10/12/2016   LDLCALC 150 (H) 01/25/2018   LDLCALC 96 10/12/2016    HbgA1c: Hgb A1c MFr Bld (%)  Date Value  01/25/2018 5.8 (H)    TSH: TSH (uIU/mL)  Date Value  11/30/2023 1.155  01/25/2018 2.040     5. Discharge Planning:              -- Social work and case management to assist with discharge planning and identification of hospital follow-up needs prior to discharge             -- Discharge Concerns: Need to establish a safety plan; Medication compliance and effectiveness             -- Discharge Goals: Return home with outpatient referrals for mental health follow-up including medication management/psychotherapy     Karleen Kaufmann, MD PGY-4

## 2023-12-09 NOTE — Plan of Care (Signed)
   Problem: Education: Goal: Emotional status will improve Outcome: Progressing Goal: Mental status will improve Outcome: Progressing Goal: Verbalization of understanding the information provided will improve Outcome: Progressing

## 2023-12-09 NOTE — Plan of Care (Signed)
  Problem: Education: Goal: Knowledge of [place] [facility] Education information/materials will improve Outcome: Progressing Goal: Emotional status will improve Outcome: Progressing Goal: Mental status will improve Outcome: Progressing Goal: Verbalization of understanding the information provided will improve Outcome: Progressing   Problem: Activity: Goal: Interest or engagement in activities will improve Outcome: Progressing Goal: Sleeping patterns will improve Outcome: Progressing   Problem: Coping: Goal: Ability to verbalize frustrations and anger appropriately will improve Outcome: Progressing Goal: Ability to demonstrate self-control will improve Outcome: Progressing   Problem: Health Behavior/Discharge Planning: Goal: Identification of resources available to assist in meeting health care needs will improve Outcome: Progressing Goal: Compliance with treatment plan for underlying cause of condition will improve Outcome: Progressing   Problem: Physical Regulation: Goal: Ability to maintain clinical measurements within normal limits will improve Outcome: Progressing   Problem: Safety: Goal: Periods of time without injury will increase Outcome: Progressing   Problem: Education: Goal: Knowledge of [place] [facility] Education information/materials will improve Outcome: Progressing Goal: Emotional status will improve Outcome: Progressing Goal: Mental status will improve Outcome: Progressing Goal: Verbalization of understanding the information provided will improve Outcome: Progressing   Problem: Activity: Goal: Interest or engagement in activities will improve Outcome: Progressing Goal: Sleeping patterns will improve Outcome: Progressing   Problem: Coping: Goal: Ability to verbalize frustrations and anger appropriately will improve Outcome: Progressing Goal: Ability to demonstrate self-control will improve Outcome: Progressing   Problem: Health  Behavior/Discharge Planning: Goal: Identification of resources available to assist in meeting health care needs will improve Outcome: Progressing Goal: Compliance with treatment plan for underlying cause of condition will improve Outcome: Progressing   Problem: Physical Regulation: Goal: Ability to maintain clinical measurements within normal limits will improve Outcome: Progressing   Problem: Safety: Goal: Periods of time without injury will increase Outcome: Progressing   Problem: Activity: Goal: Will identify at least one activity in which they can participate Outcome: Progressing   Problem: Coping: Goal: Ability to identify and develop effective coping behavior will improve Outcome: Progressing Goal: Ability to interact with others will improve Outcome: Progressing Goal: Demonstration of participation in decision-making regarding own care will improve Outcome: Progressing Goal: Ability to use eye contact when communicating with others will improve Outcome: Progressing   Problem: Health Behavior/Discharge Planning: Goal: Identification of resources available to assist in meeting health care needs will improve Outcome: Progressing   Problem: Self-Concept: Goal: Will verbalize positive feelings about self Outcome: Progressing   Problem: Education: Goal: Ability to make informed decisions regarding treatment will improve Outcome: Progressing   Problem: Coping: Goal: Coping ability will improve Outcome: Progressing   Problem: Health Behavior/Discharge Planning: Goal: Identification of resources available to assist in meeting health care needs will improve Outcome: Progressing   Problem: Medication: Goal: Compliance with prescribed medication regimen will improve Outcome: Progressing   Problem: Self-Concept: Goal: Ability to disclose and discuss suicidal ideas will improve Outcome: Progressing Goal: Will verbalize positive feelings about self Outcome:  Progressing Note: Patient is on track. Patient will maintain adherence

## 2023-12-09 NOTE — Group Note (Signed)
 Date:  12/09/2023 Time:  8:24 PM  Group Topic/Focus:  Wrap-Up Group:   The focus of this group is to help patients review their daily goal of treatment and discuss progress on daily workbooks.    Participation Level:  Did Not Attend  Sydni Elizarraraz Dacosta 12/09/2023, 8:24 PM

## 2023-12-09 NOTE — Progress Notes (Addendum)
 Pt rates depression 5/10 and anxiety 5/10. Pt shares she wants to go home, has a great pyreness puppy she wants to meet and name it Richarda. Pt requested and was provided PRN pain med for 6/10 pain on her right toe and elbow. Pt reports a good appetite. Pt denies SI/HI/AVH and verbally contracts for safety. Provided support and encouragement. Pt safe on the unit. Q 15 minute safety checks continued.

## 2023-12-09 NOTE — Group Note (Signed)
 Occupational Therapy Group Note  Group Topic: Sleep Hygiene  Group Date: 12/09/2023 Start Time: 1531 End Time: 1559 Facilitators: Dot Dallas MATSU, OT   Group Description: Group encouraged increased participation and engagement through topic focused on sleep hygiene. Patients reflected on the quality of sleep they typically receive and identified areas that need improvement. Group was given background information on sleep and sleep hygiene, including common sleep disorders. Group members also received information on how to improve one's sleep and introduced a sleep diary as a tool that can be utilized to track sleep quality over a length of time. Group session ended with patients identifying one or more strategies they could utilize or implement into their sleep routine in order to improve overall sleep quality.        Therapeutic Goal(s):  Identify one or more strategies to improve overall sleep hygiene  Identify one or more areas of sleep that are negatively impacted (sleep too much, too little, etc)     Participation Level: Engaged   Participation Quality: Independent   Behavior: Appropriate   Speech/Thought Process: Relevant   Affect/Mood: Appropriate   Insight: Fair   Judgement: Fair      Modes of Intervention: Education  Patient Response to Interventions:  Attentive   Plan: Continue to engage patient in OT groups 2 - 3x/week.  12/09/2023  [place] MATSU Dot, OT   Alicia Porter, OT

## 2023-12-10 MED ORDER — ARIPIPRAZOLE 5 MG PO TABS: 5.0000 mg | ORAL_TABLET | Freq: Every day | ORAL | 0 refills | Status: DC

## 2023-12-10 MED ORDER — LAMOTRIGINE ER 100 MG PO TB24: 100.0000 mg | ORAL_TABLET | Freq: Every day | ORAL | 0 refills | Status: DC

## 2023-12-10 MED ORDER — ARIPIPRAZOLE ER 400 MG IM SRER: 300.0000 mg | INTRAMUSCULAR | 0 refills | Status: DC

## 2023-12-10 NOTE — Progress Notes (Signed)
 D: Patient verbalizes readiness for discharge, denies suicidal and homicidal ideations, denies auditory and visual hallucinations.  No complaints of pain. Suicide Risk Assessment provided to pt. Pt received AVS.  A:  Patient was receptive to discharge instructions. Questions encouraged, verbalize understanding.  R:  Escorted to the lobby by this RN.

## 2023-12-10 NOTE — BHH Suicide Risk Assessment (Signed)
 [facility] Discharge Suicide Risk Assessment   Principal Problem: Bipolar 1 disorder, severe, current or most recent episode manic, with psychotic features [facility]) Discharge Diagnoses: Principal Problem:   Bipolar 1 disorder, severe, current or most recent episode manic, with psychotic features (HCC) Active Problems:   Agitation   PTSD (post-traumatic stress disorder)   Psychosis (HCC)   Suicidal thoughts   Demographic Factors:  Caucasian  Loss Factors: NA  Historical Factors: Impulsivity  Risk Reduction Factors:   Living with another person, especially a relative  Continued Clinical Symptoms:  Bipolar disorder, current in remission  Cognitive Features That Contribute To Risk:  None    Suicide Risk:  Mild:  Suicidal ideation of limited frequency, intensity, duration, and specificity.  There are no identifiable plans, no associated intent, mild dysphoria and related symptoms, good self-control (both objective and subjective assessment), few other risk factors, and identifiable protective factors, including available and accessible social support.   Follow-up Information     [facility]. Go on 12/23/2023.   Specialty: Behavioral Health Why: You have an appointment for medication management services on 12/23/23 at 2:00 pm, in person. Contact information: [address] [place]  [address] [phone]        [facility]. Go on 12/15/2023.   Specialty: Professional Counselor Why: Please go to this provider on 12/15/23 at 9:00 am for an assessment, to obtain therapy services.  Please request a female therapist.  Rosine may also go Monday through Friday, from 9 am to 1 pm for your initial assessment. Contact information: [address]  [address] [place] [address] [phone]         [facility]. Go on 12/14/2023.   Specialty: Behavioral Health Why: You have an  appointment with the medication management services on 12/14/23 at 2:00 pm . Contact information: [address] [facility] [place]  [address] [phone]                 Oliva DELENA Salmon, DO 12/10/2023, 8:50 AM

## 2023-12-10 NOTE — Progress Notes (Signed)
  [facility] Adult Case Management Discharge Plan :  Will you be returning to the same living situation after discharge:  Yes,  patient lives with her roommate, Regina Zanin  At discharge, do you have transportation home?: Yes,  Angeline will pick up patient at 10:00 AM Do you have the ability to pay for your medications: Yes,  patient has insurance  Release of information consent forms completed and in the chart;  Patient's signature needed at discharge.  Patient to Follow up at:  Follow-up Information     [facility]. Go on 12/23/2023.   Specialty: Behavioral Health Why: You have an appointment for medication management services on 12/23/23 at 2:00 pm, in person. Contact information: [address] [place]  [address] [phone]        [facility] Go on 12/15/2023.   Specialty: Professional Counselor Why: Please go to this provider on 12/15/23 at 9:00 am for an assessment, to obtain therapy services.  Please request a female therapist.  Rosine may also go Monday through Friday, from 9 am to 1 pm for your initial assessment. Contact information: [address]  [address] [place] [address] [phone]         [facility]. Go on 12/14/2023.   Specialty: Behavioral Health Why: You have an appointment with the medication management services on 12/14/23 at 2:00 pm . Contact information: [address] [place]  [address] [phone]                Next level of care provider has access to [facility] Link:no  Safety Planning and Suicide Prevention discussed: Yes,  Regina Zanin (roommate)  [phone]  Has patient been referred to the Quitline?: Patient refused referral for treatment  Patient has been referred for addiction treatment: No known substance use disorder.  Patient said that she struggled with alcohol  use in 2015 - 2016, and overcame this  addiction.  She said that she had a few relapses but overcame this addiction.  Per her roommate, she no longer struggles with this addiction.     Armie Moren O Lochlan Grygiel, LCSWA 12/10/2023, 9:13 AM

## 2023-12-10 NOTE — Progress Notes (Signed)
 Pt presents pleasant. Pt shares she is ready to discharge. Pt reports a good appetite, and no physical problems. Pt denies SI/HI/AVH and verbally contracts for safety. Provided support and encouragement. Pt safe on the unit. Q 15 minute safety checks continued.

## 2023-12-10 NOTE — Progress Notes (Addendum)
(  Sleep Hours) -8.75 (Any PRNs that were needed, meds refused, or side effects to meds)- Trazodone  and tylenol   (Any disturbances and when (visitation, over night)-none (Concerns raised by the patient)- none (SI/HI/AVH)-denied

## 2023-12-10 NOTE — Plan of Care (Signed)
  Problem: Education: Goal: Knowledge of [place] [facility] Education information/materials will improve Outcome: Progressing Goal: Emotional status will improve Outcome: Progressing Goal: Mental status will improve Outcome: Progressing Goal: Verbalization of understanding the information provided will improve Outcome: Progressing   Problem: Activity: Goal: Interest or engagement in activities will improve Outcome: Progressing Goal: Sleeping patterns will improve Outcome: Progressing   Problem: Coping: Goal: Ability to verbalize frustrations and anger appropriately will improve Outcome: Progressing Goal: Ability to demonstrate self-control will improve Outcome: Progressing   Problem: Health Behavior/Discharge Planning: Goal: Identification of resources available to assist in meeting health care needs will improve Outcome: Progressing Goal: Compliance with treatment plan for underlying cause of condition will improve Outcome: Progressing   Problem: Physical Regulation: Goal: Ability to maintain clinical measurements within normal limits will improve Outcome: Progressing   Problem: Safety: Goal: Periods of time without injury will increase Outcome: Progressing   Problem: Education: Goal: Knowledge of [place] [facility] Education information/materials will improve Outcome: Progressing Goal: Emotional status will improve Outcome: Progressing Goal: Mental status will improve Outcome: Progressing Goal: Verbalization of understanding the information provided will improve Outcome: Progressing   Problem: Activity: Goal: Interest or engagement in activities will improve Outcome: Progressing Goal: Sleeping patterns will improve Outcome: Progressing   Problem: Coping: Goal: Ability to verbalize frustrations and anger appropriately will improve Outcome: Progressing Goal: Ability to demonstrate self-control will improve Outcome: Progressing   Problem: Health  Behavior/Discharge Planning: Goal: Identification of resources available to assist in meeting health care needs will improve Outcome: Progressing Goal: Compliance with treatment plan for underlying cause of condition will improve Outcome: Progressing   Problem: Physical Regulation: Goal: Ability to maintain clinical measurements within normal limits will improve Outcome: Progressing   Problem: Safety: Goal: Periods of time without injury will increase Outcome: Progressing   Problem: Activity: Goal: Will identify at least one activity in which they can participate Outcome: Progressing   Problem: Coping: Goal: Ability to identify and develop effective coping behavior will improve Outcome: Progressing Goal: Ability to interact with others will improve Outcome: Progressing Goal: Demonstration of participation in decision-making regarding own care will improve Outcome: Progressing Goal: Ability to use eye contact when communicating with others will improve Outcome: Progressing   Problem: Health Behavior/Discharge Planning: Goal: Identification of resources available to assist in meeting health care needs will improve Outcome: Progressing   Problem: Self-Concept: Goal: Will verbalize positive feelings about self Outcome: Progressing   Problem: Education: Goal: Ability to make informed decisions regarding treatment will improve Outcome: Progressing   Problem: Coping: Goal: Coping ability will improve Outcome: Progressing   Problem: Health Behavior/Discharge Planning: Goal: Identification of resources available to assist in meeting health care needs will improve Outcome: Progressing   Problem: Medication: Goal: Compliance with prescribed medication regimen will improve Outcome: Progressing   Problem: Self-Concept: Goal: Ability to disclose and discuss suicidal ideas will improve Outcome: Progressing Goal: Will verbalize positive feelings about self Outcome:  Progressing Note: Patient is on track. Patient will maintain adherence
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
## 2023-12-13 NOTE — H&P (Signed)
 Physical exam and mental status exam were previously documented in the H&P  Constitutional: No acute distress HEENT: Normocephalic, atraumatic Strength: Within normal limits bilaterally in upper and lower extremities Gait: Within normal limits Neuro: No focal neurological deficits

## 2023-12-14 ENCOUNTER — Ambulatory Visit (HOSPITAL_COMMUNITY): Payer: MEDICAID | Admitting: Licensed Clinical Social Worker

## 2023-12-14 ENCOUNTER — Ambulatory Visit (INDEPENDENT_AMBULATORY_CARE_PROVIDER_SITE_OTHER): Payer: MEDICAID | Admitting: Psychiatry

## 2023-12-14 ENCOUNTER — Encounter (HOSPITAL_COMMUNITY): Payer: Self-pay | Admitting: Psychiatry

## 2023-12-14 VITALS — BP 124/82 | HR 98 | Resp 15 | Ht 66.0 in | Wt 245.6 lb

## 2023-12-14 DIAGNOSIS — F411 Generalized anxiety disorder: Secondary | ICD-10-CM | POA: Diagnosis not present

## 2023-12-14 DIAGNOSIS — F319 Bipolar disorder, unspecified: Secondary | ICD-10-CM

## 2023-12-14 DIAGNOSIS — Z0000 Encounter for general adult medical examination without abnormal findings: Secondary | ICD-10-CM

## 2023-12-14 MED ORDER — LAMOTRIGINE ER 100 MG PO TB24: 100.0000 mg | ORAL_TABLET | Freq: Every day | ORAL | 3 refills | Status: DC

## 2023-12-14 MED ORDER — TRAZODONE HCL 100 MG PO TABS: 250.0000 mg | ORAL_TABLET | Freq: Every day | ORAL | 3 refills | Status: DC

## 2023-12-14 MED ORDER — ABILIFY MAINTENA 300 MG IM PRSY: 300.0000 mg | PREFILLED_SYRINGE | INTRAMUSCULAR | 11 refills | Status: DC

## 2023-12-14 MED ORDER — ARIPIPRAZOLE ER 300 MG IM SRER
300.0000 mg | Freq: Once | INTRAMUSCULAR | Status: AC
  Administered 2024-01-05: 300 mg via INTRAMUSCULAR
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
## 2023-12-23 ENCOUNTER — Ambulatory Visit (HOSPITAL_COMMUNITY): Payer: MEDICAID | Admitting: Medical

## 2024-01-05 ENCOUNTER — Ambulatory Visit (INDEPENDENT_AMBULATORY_CARE_PROVIDER_SITE_OTHER): Payer: MEDICAID

## 2024-01-05 ENCOUNTER — Encounter (HOSPITAL_COMMUNITY): Payer: Self-pay

## 2024-01-05 VITALS — BP 102/85 | HR 94 | Resp 16 | Ht 66.0 in | Wt 242.8 lb

## 2024-01-05 DIAGNOSIS — F312 Bipolar disorder, current episode manic severe with psychotic features: Secondary | ICD-10-CM | POA: Diagnosis not present

## 2024-01-05 DIAGNOSIS — F319 Bipolar disorder, unspecified: Secondary | ICD-10-CM

## 2024-01-05 NOTE — Progress Notes (Cosign Needed)
 Patient presented to office for injection of Abilify  Maintena 300 mg.   She reports akathisia and constantly needing to move. I didn't notice lip smacking, tongue movement or tremors. However, swaying of legs. Dr. Harl notified.   Patient stated she was experiencing this prior to the injection a month ago and has gotten worse sine starting medication.   Patient received injection in right deltoid. Patient tolerated injection well. Patient denies SI/HI/AVH. Patient will return in 28 days. Patient left alert and ambulatory.

## 2024-01-07 LAB — GENECONNECT MOLECULAR SCREEN: Genetic Analysis Overall Interpretation: NEGATIVE

## 2024-01-13 ENCOUNTER — Other Ambulatory Visit: Payer: Self-pay

## 2024-01-13 ENCOUNTER — Encounter (HOSPITAL_COMMUNITY): Payer: Self-pay | Admitting: Emergency Medicine

## 2024-01-13 ENCOUNTER — Emergency Department (HOSPITAL_COMMUNITY)
Admission: EM | Admit: 2024-01-13 | Discharge: 2024-01-13 | Disposition: A | Discharge status: Home / Self Care | Payer: MEDICAID | Source: Ambulatory Visit | Attending: Emergency Medicine | Admitting: Emergency Medicine

## 2024-01-13 ENCOUNTER — Emergency Department (HOSPITAL_COMMUNITY): Payer: MEDICAID

## 2024-01-13 DIAGNOSIS — R319 Hematuria, unspecified: Secondary | ICD-10-CM | POA: Insufficient documentation

## 2024-01-13 DIAGNOSIS — R300 Dysuria: Secondary | ICD-10-CM | POA: Insufficient documentation

## 2024-01-13 DIAGNOSIS — R10A1 Flank pain, right side: Secondary | ICD-10-CM | POA: Insufficient documentation

## 2024-01-13 DIAGNOSIS — R1031 Right lower quadrant pain: Secondary | ICD-10-CM | POA: Insufficient documentation

## 2024-01-13 LAB — COMPREHENSIVE METABOLIC PANEL WITH GFR
ALT: 19 U/L (ref 0–44)
AST: 20 U/L (ref 15–41)
Albumin: 4.2 g/dL (ref 3.5–5.0)
Alkaline Phosphatase: 77 U/L (ref 38–126)
Anion gap: 10 (ref 5–15)
BUN: 13 mg/dL (ref 6–20)
CO2: 25 mmol/L (ref 22–32)
Calcium: 9.9 mg/dL (ref 8.9–10.3)
Chloride: 104 mmol/L (ref 98–111)
Creatinine, Ser: 0.75 mg/dL (ref 0.44–1.00)
GFR, Estimated: >60 mL/min (ref >60)
Glucose, Bld: 134 mg/dL — ABNORMAL HIGH (ref 70–99)
Potassium: 4.1 mmol/L (ref 3.5–5.1)
Sodium: 140 mmol/L (ref 135–145)
Total Bilirubin: <0.2 mg/dL (ref 0.0–1.2)
Total Protein: 6.9 g/dL (ref 6.5–8.1)

## 2024-01-13 LAB — URINALYSIS, ROUTINE W REFLEX MICROSCOPIC
Bacteria, UA: NONE SEEN
Bilirubin Urine: NEGATIVE
Glucose, UA: NEGATIVE mg/dL
Ketones, ur: NEGATIVE mg/dL
Leukocytes,Ua: NEGATIVE
Nitrite: NEGATIVE
Protein, ur: NEGATIVE mg/dL
Specific Gravity, Urine: 1.005 (ref 1.005–1.030)
pH: 5 (ref 5.0–8.0)

## 2024-01-13 LAB — CBC
HCT: 39.9 % (ref 36.0–46.0)
Hemoglobin: 13.2 g/dL (ref 12.0–15.0)
MCH: 31.6 pg (ref 26.0–34.0)
MCHC: 33.1 g/dL (ref 30.0–36.0)
MCV: 95.5 fL (ref 80.0–100.0)
Platelets: 318 K/uL (ref 150–400)
RBC: 4.18 MIL/uL (ref 3.87–5.11)
RDW: 13.7 % (ref 11.5–15.5)
WBC: 10.3 K/uL (ref 4.0–10.5)
nRBC: 0 % (ref 0.0–0.2)

## 2024-01-13 LAB — LIPASE, BLOOD: Lipase: 61 U/L — ABNORMAL HIGH (ref 11–51)

## 2024-01-13 MED ORDER — IOHEXOL 300 MG/ML  SOLN
100.0000 mL | Freq: Once | INTRAMUSCULAR | Status: AC | PRN
  Administered 2024-01-13: 100 mL via INTRAVENOUS

## 2024-01-13 MED ORDER — VALACYCLOVIR HCL 1 G PO TABS: 1000.0000 mg | ORAL_TABLET | Freq: Three times a day (TID) | ORAL | 0 refills | Status: AC

## 2024-01-13 MED ORDER — OXYCODONE-ACETAMINOPHEN 5-325 MG PO TABS
1.0000 | ORAL_TABLET | Freq: Once | ORAL | Status: AC
  Administered 2024-01-13: 1 via ORAL
  Filled 2024-01-13: qty 1

## 2024-01-13 MED ORDER — ONDANSETRON 4 MG PO TBDP: 4.0000 mg | ORAL_TABLET | Freq: Three times a day (TID) | ORAL | 0 refills | Status: AC | PRN

## 2024-01-13 MED ORDER — OXYCODONE-ACETAMINOPHEN 5-325 MG PO TABS: 1.0000 | ORAL_TABLET | Freq: Four times a day (QID) | ORAL | 0 refills | Status: AC | PRN

## 2024-01-13 MED ORDER — HYDROMORPHONE HCL 1 MG/ML IJ SOLN
1.0000 mg | Freq: Once | INTRAMUSCULAR | Status: AC
  Administered 2024-01-13: 1 mg via INTRAVENOUS
  Filled 2024-01-13: qty 1

## 2024-01-13 NOTE — Discharge Instructions (Signed)
 Your history, exam, and workup today seem consistent with a recently passed kidney stone causing residual right sided flank pain and blood in the urine called hematuria.  The CT scan did not show other concerning findings as we discussed however with the location of your pain, we also felt it was reasonable to give you prescription for valacyclovir to treat for shingles if you develop a rash in the next few days with the pain.  Please use the pain and nausea medicine to help with symptoms and please rest and stay hydrated.  If any symptoms change or worsen acutely, please return to the nearest emergency department.

## 2024-01-13 NOTE — ED Triage Notes (Signed)
 Patient passed kidney stones 3 weeks ago. She believes she may be undergoing the same thing today and complains of right flank pain and lower abdominal pain. UC sent her here due to concern for kidney stones but also wanted to rule out issues with her appendix. Abdomen is tender to palpation. She has not notice blood in the urine, but does report burning with urination.

## 2024-01-13 NOTE — ED Provider Notes (Incomplete)
 [place] [facility]MENT AT [facility] Provider Note   CSN: [id number] Arrival date & time: 01/13/24  1623     Patient presents with: Flank Pain, Dysuria, and Abdominal Pain   Alicia Porter is a 53 y.o. female.  {Add pertinent medical, surgical, social history, OB history to YEP:67052} The history is provided by the patient and medical records. No language interpreter was used.  Flank Pain This is a recurrent problem. The current episode started 12 to 24 hours ago. The problem occurs constantly. The problem has not changed since onset.Associated symptoms include abdominal pain. Pertinent negatives include no chest pain, no headaches and no shortness of breath. Nothing aggravates the symptoms. Nothing relieves the symptoms. She has tried nothing for the symptoms. The treatment provided no relief.  Dysuria Pain quality:  Burning Associated symptoms: abdominal pain, flank pain and nausea   Associated symptoms: no fever, no vaginal discharge and no vomiting   Abdominal Pain Pain location:  R flank and RLQ Pain quality: aching and sharp   Pain radiates to:  Does not radiate Pain severity:  Severe Onset quality:  Gradual Duration:  1 day Timing:  Constant Progression:  Waxing and waning Chronicity:  Recurrent Relieved by:  Nothing Worsened by:  Palpation Ineffective treatments:  None tried Associated symptoms: dysuria and nausea   Associated symptoms: no chest pain, no chills, no constipation, no cough, no diarrhea, no fatigue, no fever, no hematuria, no shortness of breath, no vaginal bleeding, no vaginal discharge and no vomiting        Prior to Admission medications   Medication Sig Start Date End Date Taking? Authorizing Provider  ARIPiprazole  ER (ABILIFY  MAINTENA) 300 MG PRSY prefilled syringe Inject 300 mg into the muscle every 28 (twenty-eight) days. 12/14/23   Harl Zane BRAVO, NP  cetirizine  (ZYRTEC ) 10 MG tablet Take 10 mg by mouth daily.  09/03/23   [provider]  EPINEPHrine 0.3 mg/0.3 mL IJ SOAJ injection Inject 0.3 mg into the muscle. 10/12/23   [provider]  lamoTRIgine  (LAMICTAL  XR) 100 MG 24 hour tablet Take 1 tablet (100 mg total) by mouth at bedtime. 12/14/23   Harl Zane BRAVO, NP  traZODone  (DESYREL ) 100 MG tablet Take 2.5 tablets (250 mg total) by mouth at bedtime. 12/14/23   Harl Zane BRAVO, NP    Allergies: Hornet venom, Levofloxacin, Wellbutrin [bupropion hcl], Dog epithelium, and Fluoxetine     Review of Systems  Constitutional:  Negative for chills, fatigue and fever.  HENT:  Negative for congestion.   Respiratory:  Negative for cough, chest tightness and shortness of breath.   Cardiovascular:  Negative for chest pain, palpitations and leg swelling.  Gastrointestinal:  Positive for abdominal pain and nausea. Negative for constipation, diarrhea and vomiting.  Genitourinary:  Positive for dysuria and flank pain. Negative for hematuria, pelvic pain, vaginal bleeding and vaginal discharge.  Musculoskeletal:  Negative for back pain, neck pain and neck stiffness.  Skin:  Negative for rash and wound.  Neurological:  Negative for light-headedness and headaches.  Psychiatric/Behavioral:  Negative for agitation and confusion.   All other systems reviewed and are negative.   Updated Vital Signs BP (!) 134/90   Pulse (!) 106   Temp 98.2 F (36.8 C) (Oral)   Resp 18   SpO2 97%   Physical Exam Vitals and nursing note reviewed.  Constitutional:      General: She is not in acute distress.    Appearance: She is well-developed. She is not ill-appearing,  toxic-appearing or diaphoretic.  HENT:     Head: Normocephalic and atraumatic.     Nose: No congestion or rhinorrhea.     Mouth/Throat:     Mouth: Mucous membranes are moist.     Pharynx: No oropharyngeal exudate or posterior oropharyngeal erythema.  Eyes:     Extraocular Movements: Extraocular movements intact.     Conjunctiva/sclera:  Conjunctivae normal.     Pupils: Pupils are equal, round, and reactive to light.  Cardiovascular:     Rate and Rhythm: Normal rate and regular rhythm.     Heart sounds: No murmur heard. Pulmonary:     Effort: Pulmonary effort is normal. No respiratory distress.     Breath sounds: Normal breath sounds. No wheezing, rhonchi or rales.  Chest:     Chest wall: No tenderness.  Abdominal:     General: Abdomen is flat. Bowel sounds are normal.     Palpations: Abdomen is soft.     Tenderness: There is abdominal tenderness in the right lower quadrant. There is no right CVA tenderness, guarding or rebound.      Comments: Tenderness in right flank and right lower quadrant.  No rash to suggest shingles.  No significant CVA tenderness or midline back tenderness.  Patient pacing around the room unable to get comfortable.  Musculoskeletal:        General: Tenderness present. No swelling.     Cervical back: Neck supple.     Right lower leg: No edema.  Skin:    General: Skin is warm and dry.     Capillary Refill: Capillary refill takes less than 2 seconds.     Findings: No erythema or rash.  Neurological:     General: No focal deficit present.     Mental Status: She is alert.  Psychiatric:        Mood and Affect: Mood normal.     (all labs ordered are listed, but only abnormal results are displayed) Labs Reviewed  URINALYSIS, ROUTINE W REFLEX MICROSCOPIC - Abnormal; Notable for the following components:      Result Value   Color, Urine STRAW (*)    Hgb urine dipstick SMALL (*)    All other components within normal limits  LIPASE, BLOOD - Abnormal; Notable for the following components:   Lipase 61 (*)    All other components within normal limits  COMPREHENSIVE METABOLIC PANEL WITH GFR - Abnormal; Notable for the following components:   Glucose, Bld 134 (*)    All other components within normal limits  CBC    EKG: None  Radiology: CT ABDOMEN PELVIS W CONTRAST Result Date:  01/13/2024 CLINICAL DATA:  Abdominal/flank pain, stone suspected RLQ abdominal pain Concern for possible kidney stone on the right side however also has right side abdominal pain, history of terminal ileitis, and want to make sure there is no other problem such as appendicitis. EXAM: CT ABDOMEN AND PELVIS WITH CONTRAST TECHNIQUE: Multidetector CT imaging of the abdomen and pelvis was performed using the standard protocol following bolus administration of intravenous contrast. RADIATION DOSE REDUCTION: This exam was performed according to the departmental dose-optimization program which includes automated exposure control, adjustment of the mA and/or kV according to patient size and/or use of iterative reconstruction technique. CONTRAST:  OMNIPAQUE IOHEXOL 300 MG/ML  SOLN COMPARISON:  CT enterography 06/22/2023, images available but no report FINDINGS: Lower chest: Mild dependent atelectatic changes in the lower lobes. No pleural effusion. Hepatobiliary: No suspicious liver lesion. Partially distended gallbladder. No calcified  gallstone. No pericholecystic inflammation. No common bile duct dilatation. Pancreas: No ductal dilatation or inflammation. Spleen: Normal in size without focal abnormality. Adrenals/Urinary Tract: No adrenal nodule. No hydronephrosis. No renal calculi. Homogeneous renal enhancement with symmetric excretion on delayed phase imaging. No evidence of renal inflammation. Unremarkable urinary bladder. Stomach/Bowel: Normal appearance of the stomach. Fecalization of distal small bowel contents. No small bowel wall thickening, inflammation or obstruction. Moderate volume of stool in the ascending and transverse colon. Air-filled transverse, descending and proximal sigmoid colon. There is no colonic wall thickening or pericolonic edema. Occasional colonic diverticula without diverticulitis. Diminutive normal appendix. No appendicitis Vascular/Lymphatic: Mild aortic atherosclerosis. No aortic  aneurysm. The portal, splenic and mesenteric veins are patent. No suspicious lymphadenopathy. Reproductive: Uterus and bilateral adnexa are unremarkable. Other: No free air, free fluid, or intra-abdominal fluid collection. Minimal fat containing umbilical hernia. No inguinal hernia. Musculoskeletal: Trace anterolisthesis L4 on L5 with facet hypertrophy. Minor scoliosis. There are no acute or suspicious osseous abnormalities. IMPRESSION: 1. No renal stones or obstructive uropathy. No acute abnormality in the abdomen/pelvis. 2. Fecalization of distal small bowel contents, can be seen with slow transit. No bowel obstruction or inflammation. Aortic Atherosclerosis (ICD10-I70.0). Electronically Signed   By: Andrea Gasman M.D.   On: 01/13/2024 22:11    {Document cardiac monitor, telemetry assessment procedure when appropriate:32947} Procedures   Medications Ordered in the ED  HYDROmorphone  (DILAUDID ) injection 1 mg (1 mg Intravenous Given 01/13/24 1910)  iohexol  (OMNIPAQUE ) 300 MG/ML solution 100 mL (100 mLs Intravenous Contrast Given 01/13/24 2138)  oxyCODONE -acetaminophen  (PERCOCET/ROXICET) 5-325 MG per tablet 1 tablet (1 tablet Oral Given 01/13/24 2305)      {Click here for ABCD2, HEART and other calculators REFRESH Note before signing:1}                              Medical Decision Making Amount and/or Complexity of Data Reviewed Labs: ordered. Radiology: ordered.  Risk Prescription drug management.     Alicia Porter is a 53 y.o. female with a past medical history significant for anxiety, depression, borderline personality disorder, previous terminal ileitis, PCOS, diabetes, and report of previous kidney stones who presents with right abdominal and right flank pain.  Patient says that several weeks ago she had right flank pain and then had kidney stones that she passed.  She says that it does not feel the exact same but does feel somewhat similar with pain in her right flank and right  abdomen and she has had dysuria.     {Document critical care time when appropriate  Document review of labs and clinical decision tools ie CHADS2VASC2, etc  Document your independent review of radiology images and any outside records  Document your discussion with family members, caretakers and with consultants  Document social determinants of health affecting pt's care  Document your decision making why or why not admission, treatments were needed:32947:::1}   Final diagnoses:  Right flank pain  Hematuria, unspecified type    ED Discharge Orders          Ordered    oxyCODONE -acetaminophen  (PERCOCET/ROXICET) 5-325 MG tablet  Every 6 hours PRN        01/13/24 2257    ondansetron  (ZOFRAN -ODT) 4 MG disintegrating tablet  Every 8 hours PRN        01/13/24 2257    valACYclovir  (VALTREX ) 1000 MG tablet  3 times daily        01/13/24 2257  Clinical Impression: 1. Right flank pain   2. Hematuria, unspecified type     Disposition: Discharge  Condition: Good  I have discussed the results, Dx and Tx plan with the pt(& family if present). He/she/they expressed understanding and agree(s) with the plan. Discharge instructions discussed at great length. Strict return precautions discussed and pt &/or family have verbalized understanding of the instructions. No further questions at time of discharge.    Discharge Medication List as of 01/13/2024 10:59 PM     START taking these medications   Details  ondansetron  (ZOFRAN -ODT) 4 MG disintegrating tablet Take 1 tablet (4 mg total) by mouth every 8 (eight) hours as needed for nausea or vomiting., Starting Thu 01/13/2024, Normal    oxyCODONE-acetaminophen  (PERCOCET/ROXICET) 5-325 MG tablet Take 1 tablet by mouth every 6 (six) hours as needed for severe pain (pain score 7-10)., Starting Thu 01/13/2024, Normal    valACYclovir (VALTREX) 1000 MG tablet Take 1 tablet (1,000 mg total) by mouth 3 (three) times daily for 14 days.,  Starting Thu 01/13/2024, Until Thu 01/27/2024, Normal        Follow Up: [facility] [address] [place] [address] [employer] [place]  [id number] [phone] Schedule an appointment as soon as possible for a visit    [facility] Emergency Department at [facility] [address] [place]   [phone]

## 2024-01-13 NOTE — ED Provider Notes (Signed)
 [place] EMERGENCY DEPARTMENT AT [facility] Provider Note   CSN: [id number] Arrival date & time: 01/13/24  1623     Patient presents with: Flank Pain, Dysuria, and Abdominal Pain   Alicia Porter is a 53 y.o. female.   The history is provided by the patient and medical records. No language interpreter was used.  Flank Pain This is a recurrent problem. The current episode started 12 to 24 hours ago. The problem occurs constantly. The problem has not changed since onset.Associated symptoms include abdominal pain. Pertinent negatives include no chest pain, no headaches and no shortness of breath. Nothing aggravates the symptoms. Nothing relieves the symptoms. She has tried nothing for the symptoms. The treatment provided no relief.  Dysuria Pain quality:  Burning Associated symptoms: abdominal pain, flank pain and nausea   Associated symptoms: no fever, no vaginal discharge and no vomiting   Abdominal Pain Pain location:  R flank and RLQ Pain quality: aching and sharp   Pain radiates to:  Does not radiate Pain severity:  Severe Onset quality:  Gradual Duration:  1 day Timing:  Constant Progression:  Waxing and waning Chronicity:  Recurrent Relieved by:  Nothing Worsened by:  Palpation Ineffective treatments:  None tried Associated symptoms: dysuria and nausea   Associated symptoms: no chest pain, no chills, no constipation, no cough, no diarrhea, no fatigue, no fever, no hematuria, no shortness of breath, no vaginal bleeding, no vaginal discharge and no vomiting        Prior to Admission medications   Medication Sig Start Date End Date Taking? Authorizing Provider  ARIPiprazole  ER (ABILIFY  MAINTENA) 300 MG PRSY prefilled syringe Inject 300 mg into the muscle every 28 (twenty-eight) days. 12/14/23   Harl Zane BRAVO, NP  cetirizine  (ZYRTEC ) 10 MG tablet Take 10 mg by mouth daily. 09/03/23   [provider]  EPINEPHrine 0.3 mg/0.3 mL IJ SOAJ injection  Inject 0.3 mg into the muscle. 10/12/23   [provider]  lamoTRIgine  (LAMICTAL  XR) 100 MG 24 hour tablet Take 1 tablet (100 mg total) by mouth at bedtime. 12/14/23   Harl Zane BRAVO, NP  traZODone  (DESYREL ) 100 MG tablet Take 2.5 tablets (250 mg total) by mouth at bedtime. 12/14/23   Harl Zane BRAVO, NP    Allergies: Hornet venom, Levofloxacin, Wellbutrin [bupropion hcl], Dog epithelium, and Fluoxetine     Review of Systems  Constitutional:  Negative for chills, fatigue and fever.  HENT:  Negative for congestion.   Respiratory:  Negative for cough, chest tightness and shortness of breath.   Cardiovascular:  Negative for chest pain, palpitations and leg swelling.  Gastrointestinal:  Positive for abdominal pain and nausea. Negative for constipation, diarrhea and vomiting.  Genitourinary:  Positive for dysuria and flank pain. Negative for hematuria, pelvic pain, vaginal bleeding and vaginal discharge.  Musculoskeletal:  Negative for back pain, neck pain and neck stiffness.  Skin:  Negative for rash and wound.  Neurological:  Negative for light-headedness and headaches.  Psychiatric/Behavioral:  Negative for agitation and confusion.   All other systems reviewed and are negative.   Updated Vital Signs BP (!) 134/90   Pulse (!) 106   Temp 98.2 F (36.8 C) (Oral)   Resp 18   SpO2 97%   Physical Exam Vitals and nursing note reviewed.  Constitutional:      General: She is not in acute distress.    Appearance: She is well-developed. She is not ill-appearing, toxic-appearing or diaphoretic.  HENT:  Head: Normocephalic and atraumatic.     Nose: No congestion or rhinorrhea.     Mouth/Throat:     Mouth: Mucous membranes are moist.     Pharynx: No oropharyngeal exudate or posterior oropharyngeal erythema.  Eyes:     Extraocular Movements: Extraocular movements intact.     Conjunctiva/sclera: Conjunctivae normal.     Pupils: Pupils are equal, round, and reactive to light.   Cardiovascular:     Rate and Rhythm: Normal rate and regular rhythm.     Heart sounds: No murmur heard. Pulmonary:     Effort: Pulmonary effort is normal. No respiratory distress.     Breath sounds: Normal breath sounds. No wheezing, rhonchi or rales.  Chest:     Chest wall: No tenderness.  Abdominal:     General: Abdomen is flat. Bowel sounds are normal.     Palpations: Abdomen is soft.     Tenderness: There is abdominal tenderness in the right lower quadrant. There is no right CVA tenderness, guarding or rebound.      Comments: Tenderness in right flank and right lower quadrant.  No rash to suggest shingles.  No significant CVA tenderness or midline back tenderness.  Patient pacing around the room unable to get comfortable.  Musculoskeletal:        General: Tenderness present. No swelling.     Cervical back: Neck supple.     Right lower leg: No edema.  Skin:    General: Skin is warm and dry.     Capillary Refill: Capillary refill takes less than 2 seconds.     Findings: No erythema or rash.  Neurological:     General: No focal deficit present.     Mental Status: She is alert.  Psychiatric:        Mood and Affect: Mood normal.     (all labs ordered are listed, but only abnormal results are displayed) Labs Reviewed  URINALYSIS, ROUTINE W REFLEX MICROSCOPIC - Abnormal; Notable for the following components:      Result Value   Color, Urine STRAW (*)    Hgb urine dipstick SMALL (*)    All other components within normal limits  LIPASE, BLOOD - Abnormal; Notable for the following components:   Lipase 61 (*)    All other components within normal limits  COMPREHENSIVE METABOLIC PANEL WITH GFR - Abnormal; Notable for the following components:   Glucose, Bld 134 (*)    All other components within normal limits  CBC    EKG: None  Radiology: CT ABDOMEN PELVIS W CONTRAST Result Date: 01/13/2024 CLINICAL DATA:  Abdominal/flank pain, stone suspected RLQ abdominal pain Concern  for possible kidney stone on the right side however also has right side abdominal pain, history of terminal ileitis, and want to make sure there is no other problem such as appendicitis. EXAM: CT ABDOMEN AND PELVIS WITH CONTRAST TECHNIQUE: Multidetector CT imaging of the abdomen and pelvis was performed using the standard protocol following bolus administration of intravenous contrast. RADIATION DOSE REDUCTION: This exam was performed according to the departmental dose-optimization program which includes automated exposure control, adjustment of the mA and/or kV according to patient size and/or use of iterative reconstruction technique. CONTRAST:  OMNIPAQUE  IOHEXOL  300 MG/ML  SOLN COMPARISON:  CT enterography 06/22/2023, images available but no report FINDINGS: Lower chest: Mild dependent atelectatic changes in the lower lobes. No pleural effusion. Hepatobiliary: No suspicious liver lesion. Partially distended gallbladder. No calcified gallstone. No pericholecystic inflammation. No common bile duct dilatation.  Pancreas: No ductal dilatation or inflammation. Spleen: Normal in size without focal abnormality. Adrenals/Urinary Tract: No adrenal nodule. No hydronephrosis. No renal calculi. Homogeneous renal enhancement with symmetric excretion on delayed phase imaging. No evidence of renal inflammation. Unremarkable urinary bladder. Stomach/Bowel: Normal appearance of the stomach. Fecalization of distal small bowel contents. No small bowel wall thickening, inflammation or obstruction. Moderate volume of stool in the ascending and transverse colon. Air-filled transverse, descending and proximal sigmoid colon. There is no colonic wall thickening or pericolonic edema. Occasional colonic diverticula without diverticulitis. Diminutive normal appendix. No appendicitis Vascular/Lymphatic: Mild aortic atherosclerosis. No aortic aneurysm. The portal, splenic and mesenteric veins are patent. No suspicious lymphadenopathy.  Reproductive: Uterus and bilateral adnexa are unremarkable. Other: No free air, free fluid, or intra-abdominal fluid collection. Minimal fat containing umbilical hernia. No inguinal hernia. Musculoskeletal: Trace anterolisthesis L4 on L5 with facet hypertrophy. Minor scoliosis. There are no acute or suspicious osseous abnormalities. IMPRESSION: 1. No renal stones or obstructive uropathy. No acute abnormality in the abdomen/pelvis. 2. Fecalization of distal small bowel contents, can be seen with slow transit. No bowel obstruction or inflammation. Aortic Atherosclerosis (ICD10-I70.0). Electronically Signed   By: Andrea Gasman M.D.   On: 01/13/2024 22:11     Procedures   Medications Ordered in the ED  HYDROmorphone  (DILAUDID ) injection 1 mg (1 mg Intravenous Given 01/13/24 1910)  iohexol  (OMNIPAQUE ) 300 MG/ML solution 100 mL (100 mLs Intravenous Contrast Given 01/13/24 2138)  oxyCODONE -acetaminophen  (PERCOCET/ROXICET) 5-325 MG per tablet 1 tablet (1 tablet Oral Given 01/13/24 2305)                                    Medical Decision Making Amount and/or Complexity of Data Reviewed Labs: ordered. Radiology: ordered.  Risk Prescription drug management.     Alicia Porter is a 53 y.o. female with a past medical history significant for anxiety, depression, borderline personality disorder, previous terminal ileitis, PCOS, diabetes, and report of previous kidney stones who presents with right abdominal and right flank pain.  Patient says that several weeks ago she had right flank pain and then had kidney stones that she passed.  She says that it does not feel the exact same but does feel somewhat similar with pain in her right flank and right abdomen and she has had dysuria.  She reports no fevers, chills, congestion, cough.  She had nausea but no vomiting.  She denies any bowel changes.  Denies.  Denies any history of shingles or rash.  On exam, lungs clear.  Chest nontender.  Right lower  quadrant is tender and is also tender in the right flank.  CVA nontender.  Midline back nontender.  Patient pacing around without ability to get comfort.  Patient otherwise well-appearing.  Given the history of kidney stone and the severe right abdominal and right flank pain, we will get CT scan with contrast to make sure he denies appendicitis ileitis or some other change.  Will get labs and urine and give her some medicine.  CT scan did not show acute abnormality and did not show appendicitis diverticulitis or kidney stone.  No pyelonephritis.  Urinalysis did not show UTI but did show blood.  Suspect she has pain from recently passed stone or may have pain related to shingles but does not yet have a rash.  Given her workup she would like to go home.  Will give prescription for pain medicine and  nausea medicine and will give her prescription for valacyclovir  in case she gets a rash.  Patient will follow-up with her PCP and understood return precautions.  Should with questions or concerns and was discharged in good condition.         Final diagnoses:  Right flank pain  Hematuria, unspecified type    ED Discharge Orders          Ordered    oxyCODONE -acetaminophen  (PERCOCET/ROXICET) 5-325 MG tablet  Every 6 hours PRN        01/13/24 2257    ondansetron  (ZOFRAN -ODT) 4 MG disintegrating tablet  Every 8 hours PRN        01/13/24 2257    valACYclovir  (VALTREX ) 1000 MG tablet  3 times daily        01/13/24 2257            Clinical Impression: 1. Right flank pain   2. Hematuria, unspecified type     Disposition: Discharge  Condition: Good  I have discussed the results, Dx and Tx plan with the pt(& family if present). He/she/they expressed understanding and agree(s) with the plan. Discharge instructions discussed at great length. Strict return precautions discussed and pt &/or family have verbalized understanding of the instructions. No further questions at time of discharge.     Discharge Medication List as of 01/13/2024 10:59 PM     START taking these medications   Details  ondansetron  (ZOFRAN -ODT) 4 MG disintegrating tablet Take 1 tablet (4 mg total) by mouth every 8 (eight) hours as needed for nausea or vomiting., Starting Thu 01/13/2024, Normal    oxyCODONE -acetaminophen  (PERCOCET/ROXICET) 5-325 MG tablet Take 1 tablet by mouth every 6 (six) hours as needed for severe pain (pain score 7-10)., Starting Thu 01/13/2024, Normal    valACYclovir  (VALTREX ) 1000 MG tablet Take 1 tablet (1,000 mg total) by mouth 3 (three) times daily for 14 days., Starting Thu 01/13/2024, Until Thu 01/27/2024, Normal        Follow Up: [facility] [address] [place] [address] [place]  [id number] [phone] Schedule an appointment as soon as possible for a visit    [facility] Emergency Department at [facility] [address] [place]  [address] [phone]       Ashtyn Freilich, Lonni PARAS, MD 01/14/24 [phone]

## 2024-01-17 ENCOUNTER — Telehealth (HOSPITAL_COMMUNITY): Payer: Self-pay | Admitting: Psychiatry

## 2024-01-18 ENCOUNTER — Other Ambulatory Visit (HOSPITAL_COMMUNITY): Payer: Self-pay | Admitting: Psychiatry

## 2024-01-18 DIAGNOSIS — F319 Bipolar disorder, unspecified: Secondary | ICD-10-CM

## 2024-01-18 DIAGNOSIS — F5105 Insomnia due to other mental disorder: Secondary | ICD-10-CM

## 2024-01-18 MED ORDER — DOXEPIN HCL 25 MG PO CAPS: 25.0000 mg | ORAL_CAPSULE | Freq: Every day | ORAL | 3 refills | Status: DC

## 2024-01-20 ENCOUNTER — Other Ambulatory Visit (HOSPITAL_COMMUNITY): Payer: Self-pay | Admitting: Psychiatry

## 2024-01-20 MED ORDER — PROPRANOLOL HCL 10 MG PO TABS: 10.0000 mg | ORAL_TABLET | Freq: Three times a day (TID) | ORAL | 3 refills | Status: DC

## 2024-01-20 NOTE — Telephone Encounter (Signed)
 Patient reports that she has been experiencing inner restlessness.  She reports that she has difficulty sitting still and finds herself pacing.  She also notes that she has been anxious.  Today patient is agreeable to starting propranolol 10 mg 3 times daily to help manage akathisia. Potential side effects of medication and risks vs benefits of treatment vs non-treatment were explained and discussed. All questions were answered. No other concerns noted at this time.

## 2024-02-08 ENCOUNTER — Ambulatory Visit (INDEPENDENT_AMBULATORY_CARE_PROVIDER_SITE_OTHER): Payer: MEDICAID | Admitting: Psychiatry

## 2024-02-08 ENCOUNTER — Ambulatory Visit (INDEPENDENT_AMBULATORY_CARE_PROVIDER_SITE_OTHER): Payer: MEDICAID

## 2024-02-08 ENCOUNTER — Encounter (HOSPITAL_COMMUNITY): Payer: Self-pay

## 2024-02-08 VITALS — BP 111/73 | HR 76 | Ht 66.0 in | Wt 237.4 lb

## 2024-02-08 DIAGNOSIS — F319 Bipolar disorder, unspecified: Secondary | ICD-10-CM | POA: Diagnosis not present

## 2024-02-08 DIAGNOSIS — R41840 Attention and concentration deficit: Secondary | ICD-10-CM

## 2024-02-08 DIAGNOSIS — G2571 Drug induced akathisia: Secondary | ICD-10-CM

## 2024-02-08 DIAGNOSIS — F411 Generalized anxiety disorder: Secondary | ICD-10-CM | POA: Diagnosis not present

## 2024-02-08 MED ORDER — ATOMOXETINE HCL 40 MG PO CAPS: 40.0000 mg | ORAL_CAPSULE | Freq: Every day | ORAL | 3 refills | Status: DC

## 2024-02-08 MED ORDER — PROPRANOLOL HCL 20 MG PO TABS: 20.0000 mg | ORAL_TABLET | Freq: Three times a day (TID) | ORAL | 3 refills | Status: DC

## 2024-02-08 MED ORDER — LAMOTRIGINE ER 100 MG PO TB24: 100.0000 mg | ORAL_TABLET | Freq: Every day | ORAL | 3 refills | Status: DC

## 2024-02-08 MED ORDER — ABILIFY MAINTENA 300 MG IM PRSY: 300.0000 mg | PREFILLED_SYRINGE | INTRAMUSCULAR | 11 refills | Status: DC

## 2024-02-08 MED ORDER — ARIPIPRAZOLE ER 300 MG IM PRSY
300.0000 mg | PREFILLED_SYRINGE | INTRAMUSCULAR | Status: AC
  Administered 2024-02-08 – 2024-03-07 (×2): 300 mg via INTRAMUSCULAR
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
## 2024-02-08 NOTE — Progress Notes (Cosign Needed Addendum)
 Patient presented to office for injection of Abilify  Maintena 300 mg. Patient presented with concerns of consisently being tired. Patient spoke with provider prior to injection. Patient received injection in left deltoid. Patient tolerated injection well. Patient denies SI/HI/AVH. Patient denies any concerns. Patient will return in 28 days.

## 2024-02-16 ENCOUNTER — Telehealth (HOSPITAL_COMMUNITY): Payer: Self-pay

## 2024-02-16 NOTE — Telephone Encounter (Signed)
 Please call pt she states she is having a lot of leg moments and wants to start ingrezza

## 2024-02-17 ENCOUNTER — Other Ambulatory Visit (HOSPITAL_COMMUNITY): Payer: Self-pay | Admitting: Psychiatry

## 2024-02-17 MED ORDER — VALBENAZINE TOSYLATE 40 MG PO CAPS: 40.0000 mg | ORAL_CAPSULE | Freq: Every day | ORAL | 3 refills | Status: AC

## 2024-02-17 NOTE — Telephone Encounter (Signed)
 Patient informed clinical research associate that propranolol  was effective in helping her inner restlessness.  She however notes that she continues to be disturbed by abnormal muscle movements in her leg and feet.  Provider discussed trialing Ingrezza  at patient's last visit and she notes that she would like to start it today.  Today provider sent Ingrezza  40 mg to preferred pharmacy.  Provider did inform patient that she could receive a sample from the clinic if needed.  Patient notes that at this time she does not have transportation.Potential side effects of medication and risks vs benefits of treatment vs non-treatment were explained and discussed. All questions were answered.  No other concerns at this time.

## 2024-02-18 ENCOUNTER — Telehealth (HOSPITAL_COMMUNITY): Payer: Self-pay

## 2024-02-18 NOTE — Telephone Encounter (Signed)
 Prior Authorization completed for Ingrezza  40 mg 02/18/2024. Awaiting decision from Insurance.

## 2024-02-21 NOTE — Telephone Encounter (Signed)
 Patient's insurance denied coverage for patient's ingrezza . Next option is an appeal.

## 2024-02-24 ENCOUNTER — Other Ambulatory Visit (HOSPITAL_COMMUNITY): Payer: Self-pay | Admitting: Psychiatry

## 2024-02-24 DIAGNOSIS — G2589 Other specified extrapyramidal and movement disorders: Secondary | ICD-10-CM

## 2024-02-24 MED ORDER — BENZTROPINE MESYLATE 0.5 MG PO TABS: 0.5000 mg | ORAL_TABLET | Freq: Two times a day (BID) | ORAL | 23 refills | Status: AC

## 2024-02-24 NOTE — Telephone Encounter (Signed)
 Patient agreeable to trialing Cogentin  0.5 mg twice daily to help manage drug-induced EPS. Potential side effects of medication and risks vs benefits of treatment vs non-treatment were explained and discussed. All questions were answered.

## 2024-03-01 ENCOUNTER — Ambulatory Visit: Payer: MEDICAID | Admitting: Nurse Practitioner

## 2024-03-03 ENCOUNTER — Telehealth (HOSPITAL_COMMUNITY): Payer: Self-pay

## 2024-03-03 NOTE — Telephone Encounter (Signed)
 Attempted to contact the patient at 12:15 PM today regarding concerns for hair loss.  Unable to reach the patient.  Left voicemail encouraging patient to complete labs that were ordered back in December as there may be medical contributors to her hair loss.  Patient has orders for lipid panel, prolactin, thyroid  panel, hepatic function panel, CMP, CBC ordered on 01/18/2024.

## 2024-03-03 NOTE — Telephone Encounter (Signed)
 Patient called to say her hair is falling out and she wants to know if it is from any of her current meds. Patient last saw Brittany and sees you on 2/4

## 2024-03-06 ENCOUNTER — Encounter (HOSPITAL_COMMUNITY): Payer: Self-pay

## 2024-03-07 ENCOUNTER — Emergency Department (HOSPITAL_COMMUNITY)
Admission: EM | Admit: 2024-03-07 | Discharge: 2024-03-07 | Disposition: A | Discharge status: Home / Self Care | Payer: MEDICAID | Attending: Emergency Medicine | Admitting: Emergency Medicine

## 2024-03-07 ENCOUNTER — Emergency Department (HOSPITAL_COMMUNITY): Payer: MEDICAID

## 2024-03-07 ENCOUNTER — Encounter (HOSPITAL_COMMUNITY): Payer: Self-pay

## 2024-03-07 ENCOUNTER — Ambulatory Visit (INDEPENDENT_AMBULATORY_CARE_PROVIDER_SITE_OTHER): Payer: MEDICAID

## 2024-03-07 ENCOUNTER — Ambulatory Visit (HOSPITAL_COMMUNITY): Payer: MEDICAID

## 2024-03-07 ENCOUNTER — Other Ambulatory Visit: Payer: Self-pay

## 2024-03-07 VITALS — BP 122/78 | HR 77 | Temp 97.6°F | Ht 66.0 in | Wt 240.4 lb

## 2024-03-07 DIAGNOSIS — E119 Type 2 diabetes mellitus without complications: Secondary | ICD-10-CM | POA: Insufficient documentation

## 2024-03-07 DIAGNOSIS — W19XXXA Unspecified fall, initial encounter: Secondary | ICD-10-CM | POA: Insufficient documentation

## 2024-03-07 DIAGNOSIS — S42251A Displaced fracture of greater tuberosity of right humerus, initial encounter for closed fracture: Secondary | ICD-10-CM | POA: Insufficient documentation

## 2024-03-07 DIAGNOSIS — S42201A Unspecified fracture of upper end of right humerus, initial encounter for closed fracture: Secondary | ICD-10-CM

## 2024-03-07 DIAGNOSIS — S4991XA Unspecified injury of right shoulder and upper arm, initial encounter: Secondary | ICD-10-CM | POA: Diagnosis present

## 2024-03-07 DIAGNOSIS — F333 Major depressive disorder, recurrent, severe with psychotic symptoms: Secondary | ICD-10-CM

## 2024-03-07 DIAGNOSIS — Y9301 Activity, walking, marching and hiking: Secondary | ICD-10-CM | POA: Diagnosis not present

## 2024-03-07 MED ORDER — HYDROMORPHONE HCL 1 MG/ML IJ SOLN
0.5000 mg | Freq: Once | INTRAMUSCULAR | Status: AC
  Administered 2024-03-07: 0.5 mg via INTRAVENOUS
  Filled 2024-03-07: qty 1

## 2024-03-07 MED ORDER — OXYCODONE HCL 5 MG PO TABS: 5.0000 mg | ORAL_TABLET | Freq: Four times a day (QID) | ORAL | 0 refills | Status: AC | PRN

## 2024-03-07 MED ORDER — CYCLOBENZAPRINE HCL 10 MG PO TABS: 10.0000 mg | ORAL_TABLET | Freq: Two times a day (BID) | ORAL | 0 refills | Status: AC | PRN

## 2024-03-07 MED ORDER — CYCLOBENZAPRINE HCL 10 MG PO TABS
5.0000 mg | ORAL_TABLET | Freq: Once | ORAL | Status: AC
  Administered 2024-03-07: 5 mg via ORAL
  Filled 2024-03-07: qty 1

## 2024-03-07 MED ORDER — OXYCODONE HCL 5 MG PO TABS
5.0000 mg | ORAL_TABLET | Freq: Once | ORAL | Status: AC
  Administered 2024-03-07: 5 mg via ORAL
  Filled 2024-03-07: qty 1

## 2024-03-07 NOTE — Progress Notes (Signed)
 Orthopedic Tech Progress Note Patient Details:  Alicia Porter 02-23-1970 [id number]  Ortho Devices Type of Ortho Device: Sling immobilizer Ortho Device/Splint Location: right shoulder immobilizer applied Ortho Device/Splint Interventions: Ordered, Application, Adjustment   Post Interventions Patient Tolerated: Well Instructions Provided: Adjustment of device, Care of device  Waylan Thom Loving 03/07/2024, 5:30 PM

## 2024-03-07 NOTE — Progress Notes (Signed)
 Pt presented for Injection of Abilify  300 mg   in right Deltoid. Pt tolerated injection well. Pt denies SI/HI/AVH. However, Pt states she has problem sleeping and rapid speech two weeks before her next injections are due.No additional concerns. Pt will return in 28 days.  CJT- CMA

## 2024-03-07 NOTE — ED Provider Notes (Signed)
 " [name] Provider Note   CSN: [id number] Arrival date & time: 03/07/24  1543     History Chief Complaint  Patient presents with   Shoulder Injury   HPI: Alicia Porter is a 54 y.o. female with history perinent for anxiety, depression, PCOS, borderline personality disorder, T2DM, CPAP usage, bipolar, PTSD who presents complaining of shoulder pain status post fall.. Patient arrived via EMS from scene.  History provided by patient.  No interpreter required during this encounter.  Patient reports that just prior to arrival she had a slip and fall while walking on ice.  Reports that she landed on her right shoulder, and feels like she heard a crack.  Was not sure if it was the ice or a bone.  Denies hitting her head, loss of consciousness, neck pain, back pain, states that she has been able to ambulate since the fall.  Patient's recorded medical, surgical, social, medication list and allergies were reviewed in the Snapshot window as part of the initial history.   Prior to Admission medications  Medication Sig Start Date End Date Taking? Authorizing Provider  cyclobenzaprine  (FLEXERIL ) 10 MG tablet Take 1 tablet (10 mg total) by mouth 2 (two) times daily as needed for muscle spasms. 03/07/24  Yes Rogelia Jerilynn RAMAN, MD  oxyCODONE  (ROXICODONE ) 5 MG immediate release tablet Take 1 tablet (5 mg total) by mouth every 6 (six) hours as needed for up to 5 days for severe pain (pain score 7-10). 03/07/24 03/12/24 Yes Rogelia Jerilynn RAMAN, MD  ARIPiprazole  ER (ABILIFY  MAINTENA) 300 MG PRSY prefilled syringe Inject 300 mg into the muscle every 28 (twenty-eight) days. 02/08/24   Harl Zane BRAVO, NP  atomoxetine  (STRATTERA ) 40 MG capsule Take 1 capsule (40 mg total) by mouth daily. 02/08/24   Harl Zane BRAVO, NP  benztropine  (COGENTIN ) 0.5 MG tablet Take 1 tablet (0.5 mg total) by mouth 2 (two) times daily. 02/24/24   Harl Zane BRAVO, NP  cetirizine   (ZYRTEC ) 10 MG tablet Take 10 mg by mouth daily. 09/03/23   [provider]  EPINEPHrine 0.3 mg/0.3 mL IJ SOAJ injection Inject 0.3 mg into the muscle. 10/12/23   [provider]  lamoTRIgine  (LAMICTAL  XR) 100 MG 24 hour tablet Take 1 tablet (100 mg total) by mouth at bedtime. 02/08/24   Harl Zane BRAVO, NP  ondansetron  (ZOFRAN -ODT) 4 MG disintegrating tablet Take 1 tablet (4 mg total) by mouth every 8 (eight) hours as needed for nausea or vomiting. 01/13/24   Tegeler, Lonni PARAS, MD  oxyCODONE -acetaminophen  (PERCOCET/ROXICET) 5-325 MG tablet Take 1 tablet by mouth every 6 (six) hours as needed for severe pain (pain score 7-10). 01/13/24   Tegeler, Lonni PARAS, MD  propranolol  (INDERAL ) 20 MG tablet Take 1 tablet (20 mg total) by mouth 3 (three) times daily. 02/08/24   Harl Zane BRAVO, NP  valbenazine  (INGREZZA ) 40 MG capsule Take 1 capsule (40 mg total) by mouth daily. 02/17/24   Harl Zane BRAVO, NP     Allergies: Hornet venom, Levofloxacin, Wellbutrin [bupropion hcl], Dog epithelium, and Fluoxetine    Review of Systems   ROS as per HPI  Physical Exam Updated Vital Signs BP 128/86 (BP Location: Left Arm)   Pulse 94   Temp 98.1 F (36.7 C) (Oral)   Resp 18   SpO2 97%  Physical Exam Vitals and nursing note reviewed.  Constitutional:      General: She is not in acute distress.    Appearance: She  is well-developed.  HENT:     Head: Normocephalic and atraumatic.  Eyes:     Conjunctiva/sclera: Conjunctivae normal.  Cardiovascular:     Rate and Rhythm: Normal rate and regular rhythm.     Heart sounds: No murmur heard. Pulmonary:     Effort: Pulmonary effort is normal. No respiratory distress.     Breath sounds: Normal breath sounds.  Abdominal:     Palpations: Abdomen is soft.     Tenderness: There is no abdominal tenderness.  Musculoskeletal:        General: Tenderness (Numbness palpation of left upper arm and shoulder with decreased range of motion)  present. No swelling.     Cervical back: Neck supple. No bony tenderness. No pain with movement.     Thoracic back: No bony tenderness.     Lumbar back: No bony tenderness.  Skin:    General: Skin is warm and dry.     Capillary Refill: Capillary refill takes less than 2 seconds.  Neurological:     Mental Status: She is alert.     Gait: Gait normal.  Psychiatric:        Mood and Affect: Mood normal.   Left hand INSPECTION & PALPATION: No deformity of the wrist, hand or fingers.  SENSORY:  superficial radial nerve is intact median nerve is intact  ulnar nerve is intact   MOTOR:  posterior interosseous nerve is intact  anterior interosseous nerve is intact  radial nerve is intact  median nerve is intact  ulnar nerve is intact   TENDONS: Thumb FPL/EPL/EPB/APL are intact Index finger FDS/FDP/ED/EI are intact Long finger FDS/FDP/ED are  intact Ring finger FDS/FDP/ED are intact Small finger  FDS/FDP/EDM are intact Wrist FCR/FCU/ECR/ECU are intact  VASCULAR: 2+ radial pulse Capillary refill is <2s COMPARTMENTS: Soft and compressible. No pain with passive stretch. No paresthesias.    ED Course/ Medical Decision Making/ A&P    Procedures Procedures   Medications Ordered in ED Medications  oxyCODONE  (Oxy IR/ROXICODONE ) immediate release tablet 5 mg (5 mg Oral Given 03/07/24 1719)  cyclobenzaprine  (FLEXERIL ) tablet 5 mg (5 mg Oral Given by Other 03/07/24 1719)  HYDROmorphone  (DILAUDID ) injection 0.5 mg (0.5 mg Intravenous Given 03/07/24 1759)    Medical Decision Making:   OTTIS SARNOWSKI is a 54 y.o. female who presents for fall and shoulder pain as per above.  Physical exam is pertinent for tenderness to palpation of left shoulder and small upper arm, and distally neurovascularly intact.   The differential includes but is not limited to ICH, TBI, skull fracture, spinal fracture/dislocation, blunt thoracic trauma, hemothorax, pneumothorax, rib fractures, blunt  abdominal trauma, hemorrhage, extremity fracture, dislocation.  Independent historian: None  External data reviewed: No pertinent external data  Labs: Not indicated  Radiology: Ordered, Independent interpretation, Details: Personally reviewed plain films of the left shoulder, I do appreciate slightly displaced greater tuberosity and humeral head fracture, and All images reviewed independently.  Agree with radiology report at this time.   DG Shoulder Right Result Date: 03/07/2024 CLINICAL DATA:  Fall EXAM: RIGHT SHOULDER - 2+ VIEW COMPARISON:  None Available. FINDINGS: EC joint is intact. Acute proximal humerus fracture, appears to involve the humeral neck with a mildly displaced greater tuberosity fracture fragment. No humeral head dislocation IMPRESSION: Acute proximal humerus fracture involving the humeral neck and greater tuberosity. Electronically Signed   By: Luke Bun M.D.   On: 03/07/2024 17:41    EKG/Medicine tests: Not indicated EKG Interpretation:  Interventions: Oxycodone , Flexeril , Dilaudid   See the EMR for full details regarding lab and imaging results.  Currently, patient is awake, alert, and protecting own airway and is hemodynamically stable.  Presents for shoulder pain after mechanical slip and fall on the ice.  Patient is negative per the [place] CT head and [place] CT C-spine rule, she is ambulatory, no chest pain or tenderness, therefore do not feel that she requires screening chest, pelvis x-rays, nor CTs of the head or neck.  Will obtain focused x-rays of the left shoulder.  Patient is distally neurovascularly intact.  X-rays unfortunately do reveal proximal humerus fracture and greater tuberosity fracture, patient was placed in sling.  Patient had improvement with oxycodone  and Flexeril .  Patient reports that she has a high pain tolerance, and she specifically requested 0.5 mg of Dilaudid  for pain control.  Feel that it is not unreasonable to give  patient a one-time dose of this for pain control, one-time dose was given, thereafter will have patient follow-up outpatient with orthopedic surgery, discharged with course of oxycodone  and Flexeril  for pain control, also counseled on use of Tylenol  and ibuprofen .  Presentation is most consistent with acute complicated illness, Current presentation is complicated by underlying chronic conditions, and I did consider and rule out acute life/limb-threatening illness  Discussion of management or test interpretations with external provider(s): Dr. Georgina, orthopedic surgery  Risk Drugs:Prescription drug management and Parenteral controlled substances  Disposition: DISCHARGE: I believe that the patient is safe for discharge home with outpatient follow-up. Patient was informed of all pertinent physical exam, laboratory, and imaging findings. Patient's suspected etiology of their symptom presentation was discussed with the patient and all questions were answered. We discussed following up with PCP, orthopedic surgery. I provided thorough ED return precautions. The patient feels safe and comfortable with this plan.  MDM generated using voice dictation software and may contain dictation errors.  Please contact me for any clarification or with any questions.  Clinical Impression:  1. Closed fracture of proximal end of right humerus, unspecified fracture morphology, initial encounter      Discharge   Final Clinical Impression(s) / ED Diagnoses Final diagnoses:  Closed fracture of proximal end of right humerus, unspecified fracture morphology, initial encounter    Rx / DC Orders ED Discharge Orders          Ordered    AMB referral to orthopedics        03/07/24 1816    oxyCODONE  (ROXICODONE ) 5 MG immediate release tablet  Every 6 hours PRN        03/07/24 1818    cyclobenzaprine  (FLEXERIL ) 10 MG tablet  2 times daily PRN        03/07/24 1818             Rogelia Jerilynn RAMAN, MD 03/07/24  1828  "

## 2024-03-07 NOTE — ED Triage Notes (Signed)
 Pt BIB Ems from home with reports of a right shoulder injury after falling. Pt denies hitting her head and reports being unable to lift her arm.

## 2024-03-07 NOTE — Discharge Instructions (Addendum)
 Alicia Porter  Thank you for allowing us  to take care of you today.  You came to the Emergency Department today because had a fall earlier today.  Based on the characteristics of your fall and your exam we did not need to do any CTs of your head or neck.  You did have some pain in your upper arm.  You do have a broken shoulder/proximal humerus.  You will need to follow-up with orthopedic surgery outpatient.  Please keep the arm immobilized with the sling 24/7.  You can primarily use Tylenol  and ibuprofen  for pain control, please reserve oxycodone  for breakthrough pain.  Additionally we are prescribing Flexeril , which is a muscle relaxer that you can use as needed for additional pain control.  Please do not drive while you are on the oxycodone  or Flexeril  as both of these can make you sleepy.  Please note that oxycodone  can also make you constipated.  You can use Tylenol  and ibuprofen  every 4-6 hours as needed for pain control.  You can take these medications together for maximum pain control, or you can alternate between the 2 for having medication more frequently, for example Tylenol  at noon, ibuprofen  at 3 PM, Tylenol  at 6 PM, ibuprofen  at 9 PM, etc.  For adults, the dosing is 600 mg of ibuprofen , and 500 mg of Tylenol .  Please do not exceed 4 g of Tylenol  within 24 hours.  Please do not exceed 3200 mg ibuprofen  24 hours.     To-Do: 1. Please follow-up with your primary doctor within 1 month / as soon as possible.   Please return to the Emergency Department or call 911 if you experience have worsening of your symptoms, or do not get better, chest pain, shortness of breath, severe or significantly worsening pain, high fever, severe confusion, pass out or have any reason to think that you need emergency medical care.   We hope you feel better soon.   Department of Emergency Medicine [facility] [place]

## 2024-03-15 ENCOUNTER — Ambulatory Visit: Payer: MEDICAID | Admitting: Orthopedic Surgery

## 2024-03-16 ENCOUNTER — Telehealth (HOSPITAL_COMMUNITY): Payer: MEDICAID | Admitting: Student in an Organized Health Care Education/Training Program

## 2024-03-16 DIAGNOSIS — F411 Generalized anxiety disorder: Secondary | ICD-10-CM

## 2024-03-16 DIAGNOSIS — R41840 Attention and concentration deficit: Secondary | ICD-10-CM

## 2024-03-16 DIAGNOSIS — G2571 Drug induced akathisia: Secondary | ICD-10-CM

## 2024-03-16 DIAGNOSIS — F319 Bipolar disorder, unspecified: Secondary | ICD-10-CM

## 2024-03-16 MED ORDER — ATOMOXETINE HCL 40 MG PO CAPS: 40.0000 mg | ORAL_CAPSULE | Freq: Every day | ORAL | 3 refills | Status: AC

## 2024-03-16 MED ORDER — LAMOTRIGINE ER 50 MG PO TB24: 50.0000 mg | ORAL_TABLET | Freq: Every day | ORAL | 2 refills | Status: AC

## 2024-03-16 MED ORDER — ABILIFY MAINTENA 300 MG IM PRSY: 300.0000 mg | PREFILLED_SYRINGE | INTRAMUSCULAR | 11 refills | Status: AC

## 2024-03-16 MED ORDER — PROPRANOLOL HCL 20 MG PO TABS: 20.0000 mg | ORAL_TABLET | Freq: Three times a day (TID) | ORAL | 3 refills | Status: AC

## 2024-03-16 MED ORDER — VITAMIN B-6 100 MG PO TABS: 500.0000 mg | ORAL_TABLET | Freq: Every day | ORAL | Status: AC
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
## 2024-03-27 ENCOUNTER — Other Ambulatory Visit (HOSPITAL_COMMUNITY): Payer: MEDICAID

## 2024-03-29 ENCOUNTER — Ambulatory Visit: Payer: MEDICAID | Admitting: Orthopedic Surgery

## 2024-03-30 ENCOUNTER — Ambulatory Visit (HOSPITAL_COMMUNITY): Payer: MEDICAID | Admitting: Medical

## 2024-04-03 ENCOUNTER — Other Ambulatory Visit (HOSPITAL_COMMUNITY): Payer: MEDICAID

## 2024-04-04 ENCOUNTER — Ambulatory Visit (HOSPITAL_COMMUNITY): Payer: MEDICAID

## 2024-04-13 ENCOUNTER — Encounter (HOSPITAL_COMMUNITY): Payer: MEDICAID | Admitting: Student in an Organized Health Care Education/Training Program

## 2024-04-27 ENCOUNTER — Ambulatory Visit: Payer: MEDICAID | Admitting: Family Medicine

## 2024-05-05 ENCOUNTER — Ambulatory Visit: Payer: MEDICAID | Admitting: Family Medicine

## 2024-05-10 ENCOUNTER — Ambulatory Visit (HOSPITAL_BASED_OUTPATIENT_CLINIC_OR_DEPARTMENT_OTHER): Payer: MEDICAID | Admitting: Pulmonary Disease
